# Patient Record
Sex: Male | Born: 1960
Health system: Southern US, Community
[De-identification: ages and names within clinical notes are randomized; demographics above are authoritative.]

## PROBLEM LIST (undated history)

## (undated) DIAGNOSIS — E785 Hyperlipidemia, unspecified: Secondary | ICD-10-CM

## (undated) DIAGNOSIS — K5900 Constipation, unspecified: Secondary | ICD-10-CM

## (undated) DIAGNOSIS — Z8639 Personal history of other endocrine, nutritional and metabolic disease: Secondary | ICD-10-CM

## (undated) DIAGNOSIS — R413 Other amnesia: Secondary | ICD-10-CM

## (undated) DIAGNOSIS — R9089 Other abnormal findings on diagnostic imaging of central nervous system: Secondary | ICD-10-CM

## (undated) DIAGNOSIS — M25559 Pain in unspecified hip: Secondary | ICD-10-CM

## (undated) DIAGNOSIS — M549 Dorsalgia, unspecified: Secondary | ICD-10-CM

## (undated) DIAGNOSIS — K635 Polyp of colon: Secondary | ICD-10-CM

## (undated) HISTORY — DX: Other abnormal findings on diagnostic imaging of central nervous system: R90.89

## (undated) HISTORY — DX: Personal history of other endocrine, nutritional and metabolic disease: Z86.39

## (undated) HISTORY — DX: Constipation, unspecified: K59.00

## (undated) HISTORY — PX: VASECTOMY: SHX75

## (undated) HISTORY — DX: Hyperlipidemia, unspecified: E78.5

## (undated) HISTORY — DX: Pain in unspecified hip: M25.559

## (undated) HISTORY — DX: Polyp of colon: K63.5

## (undated) HISTORY — PX: COLONOSCOPY: SHX174

## (undated) HISTORY — PX: TONSILLECTOMY: SUR1361

## (undated) HISTORY — DX: Other amnesia: R41.3

## (undated) HISTORY — PX: ROTATOR CUFF REPAIR: SHX139

## (undated) HISTORY — DX: Dorsalgia, unspecified: M54.9

## (undated) HISTORY — PX: BICEPS TENDON REPAIR: SHX566

---

## 2000-04-26 ENCOUNTER — Encounter: Payer: Self-pay | Admitting: Orthopedic Surgery

## 2000-04-26 ENCOUNTER — Ambulatory Visit (HOSPITAL_COMMUNITY): Admission: RE | Admit: 2000-04-26 | Discharge: 2000-04-26 | Payer: Self-pay | Admitting: Orthopedic Surgery

## 2000-05-22 ENCOUNTER — Ambulatory Visit (HOSPITAL_BASED_OUTPATIENT_CLINIC_OR_DEPARTMENT_OTHER): Admission: RE | Admit: 2000-05-22 | Discharge: 2000-05-22 | Payer: Self-pay | Admitting: Orthopedic Surgery

## 2001-02-03 ENCOUNTER — Ambulatory Visit (HOSPITAL_BASED_OUTPATIENT_CLINIC_OR_DEPARTMENT_OTHER): Admission: RE | Admit: 2001-02-03 | Discharge: 2001-02-03 | Payer: Self-pay | Admitting: Orthopedic Surgery

## 2005-10-22 ENCOUNTER — Encounter: Admission: RE | Admit: 2005-10-22 | Discharge: 2005-10-22 | Payer: Self-pay | Admitting: Family Medicine

## 2012-03-12 ENCOUNTER — Other Ambulatory Visit: Payer: Self-pay | Admitting: Family Medicine

## 2012-03-12 DIAGNOSIS — H9319 Tinnitus, unspecified ear: Secondary | ICD-10-CM

## 2012-03-12 DIAGNOSIS — E785 Hyperlipidemia, unspecified: Secondary | ICD-10-CM

## 2012-03-12 DIAGNOSIS — R439 Unspecified disturbances of smell and taste: Secondary | ICD-10-CM

## 2012-03-16 ENCOUNTER — Ambulatory Visit
Admission: RE | Admit: 2012-03-16 | Discharge: 2012-03-16 | Disposition: A | Discharge status: Home / Self Care | Payer: 59 | Source: Ambulatory Visit | Attending: Family Medicine | Admitting: Family Medicine

## 2012-03-16 DIAGNOSIS — R439 Unspecified disturbances of smell and taste: Secondary | ICD-10-CM

## 2012-03-16 DIAGNOSIS — H9319 Tinnitus, unspecified ear: Secondary | ICD-10-CM

## 2012-03-16 DIAGNOSIS — E785 Hyperlipidemia, unspecified: Secondary | ICD-10-CM

## 2012-03-16 MED ORDER — GADOBENATE DIMEGLUMINE 529 MG/ML IV SOLN
18.0000 mL | Freq: Once | INTRAVENOUS | Status: AC | PRN
  Administered 2012-03-16: 18 mL via INTRAVENOUS

## 2012-03-17 ENCOUNTER — Other Ambulatory Visit: Payer: Self-pay

## 2013-01-15 ENCOUNTER — Encounter: Payer: Self-pay | Admitting: *Deleted

## 2013-02-19 ENCOUNTER — Encounter: Payer: Self-pay | Admitting: *Deleted

## 2013-02-19 DIAGNOSIS — K635 Polyp of colon: Secondary | ICD-10-CM

## 2013-02-19 HISTORY — DX: Polyp of colon: K63.5

## 2013-02-19 LAB — HM COLONOSCOPY

## 2013-02-23 ENCOUNTER — Encounter: Payer: Self-pay | Admitting: *Deleted

## 2013-04-20 ENCOUNTER — Other Ambulatory Visit: Payer: Self-pay | Admitting: *Deleted

## 2013-04-20 DIAGNOSIS — Z0000 Encounter for general adult medical examination without abnormal findings: Secondary | ICD-10-CM

## 2013-04-23 ENCOUNTER — Other Ambulatory Visit: Payer: 59

## 2013-04-23 LAB — COMPLETE METABOLIC PANEL WITH GFR
ALT: 25 U/L (ref 0–53)
AST: 20 U/L (ref 0–37)
Albumin: 5 g/dL (ref 3.5–5.2)
Alkaline Phosphatase: 57 U/L (ref 39–117)
BUN: 14 mg/dL (ref 6–23)
CO2: 28 mEq/L (ref 19–32)
Calcium: 9.6 mg/dL (ref 8.4–10.5)
Chloride: 102 mEq/L (ref 96–112)
Creat: 0.9 mg/dL (ref 0.50–1.35)
GFR, Est African American: >89 mL/min
GFR, Est Non African American: >89 mL/min
Glucose, Bld: 94 mg/dL (ref 70–99)
Potassium: 4.5 mEq/L (ref 3.5–5.3)
Sodium: 140 mEq/L (ref 135–145)
Total Bilirubin: 0.8 mg/dL (ref 0.2–1.2)
Total Protein: 7 g/dL (ref 6.0–8.3)

## 2013-04-23 LAB — CBC WITH DIFFERENTIAL/PLATELET
Basophils Absolute: 0.1 10*3/uL (ref 0.0–0.1)
Basophils Relative: 2 % — ABNORMAL HIGH (ref 0–1)
Eosinophils Absolute: 0.2 10*3/uL (ref 0.0–0.7)
Eosinophils Relative: 5 % (ref 0–5)
HCT: 41.8 % (ref 39.0–52.0)
Hemoglobin: 14.8 g/dL (ref 13.0–17.0)
Lymphocytes Relative: 21 % (ref 12–46)
Lymphs Abs: 0.9 10*3/uL (ref 0.7–4.0)
MCH: 30.4 pg (ref 26.0–34.0)
MCHC: 35.4 g/dL (ref 30.0–36.0)
MCV: 85.8 fL (ref 78.0–100.0)
Monocytes Absolute: 0.5 10*3/uL (ref 0.1–1.0)
Monocytes Relative: 11 % (ref 3–12)
Neutro Abs: 2.5 10*3/uL (ref 1.7–7.7)
Neutrophils Relative %: 61 % (ref 43–77)
Platelets: 207 10*3/uL (ref 150–400)
RBC: 4.87 MIL/uL (ref 4.22–5.81)
RDW: 14 % (ref 11.5–15.5)
WBC: 4.1 10*3/uL (ref 4.0–10.5)

## 2013-04-23 LAB — LIPID PANEL
Cholesterol: 257 mg/dL — ABNORMAL HIGH (ref 0–200)
HDL: 45 mg/dL (ref >39)
LDL Cholesterol: 189 mg/dL — ABNORMAL HIGH (ref 0–99)
Total CHOL/HDL Ratio: 5.7 Ratio
Triglycerides: 116 mg/dL (ref <150)
VLDL: 23 mg/dL (ref 0–40)

## 2013-04-23 LAB — TSH: TSH: 2.265 u[IU]/mL (ref 0.350–4.500)

## 2013-05-08 ENCOUNTER — Encounter: Payer: Self-pay | Admitting: *Deleted

## 2013-05-08 DIAGNOSIS — E785 Hyperlipidemia, unspecified: Secondary | ICD-10-CM | POA: Insufficient documentation

## 2013-05-11 ENCOUNTER — Ambulatory Visit (INDEPENDENT_AMBULATORY_CARE_PROVIDER_SITE_OTHER): Payer: 59 | Admitting: Family Medicine

## 2013-05-11 ENCOUNTER — Encounter (INDEPENDENT_AMBULATORY_CARE_PROVIDER_SITE_OTHER): Payer: Self-pay

## 2013-05-11 ENCOUNTER — Encounter: Payer: 59 | Admitting: Family Medicine

## 2013-05-11 ENCOUNTER — Encounter: Payer: Self-pay | Admitting: Family Medicine

## 2013-05-11 VITALS — BP 108/72 | HR 70 | Resp 16 | Ht 70.0 in | Wt 185.0 lb

## 2013-05-11 DIAGNOSIS — Z23 Encounter for immunization: Secondary | ICD-10-CM

## 2013-05-11 DIAGNOSIS — Z0000 Encounter for general adult medical examination without abnormal findings: Secondary | ICD-10-CM

## 2013-05-11 DIAGNOSIS — R062 Wheezing: Secondary | ICD-10-CM

## 2013-05-11 LAB — POCT URINALYSIS DIPSTICK
Bilirubin, UA: NEGATIVE
Blood, UA: NEGATIVE
Glucose, UA: NEGATIVE
Ketones, UA: NEGATIVE
Leukocytes, UA: NEGATIVE
Nitrite, UA: NEGATIVE
Protein, UA: NEGATIVE
Spec Grav, UA: 1.01
Urobilinogen, UA: NEGATIVE
pH, UA: 5

## 2013-05-11 MED ORDER — ALBUTEROL SULFATE HFA 108 (90 BASE) MCG/ACT IN AERS: 2.0000 | INHALATION_SPRAY | Freq: Four times a day (QID) | RESPIRATORY_TRACT | Status: DC

## 2013-05-11 NOTE — Progress Notes (Signed)
Subjective:    Patient ID: Sean Cochran, male    DOB: [birth year], 53 y.o.   MRN: [id number]  HPI  Sean Cochran is here today for his annual CPE.  He has done well since his last office visit.  He brought his biometric screening form to be completed for his employer.  We are also going over his most recent lab results.    Review of Systems  Constitutional: Negative for appetite change, fatigue and unexpected weight change.  HENT: Negative.   Eyes: Negative for visual disturbance.  Respiratory: Negative for chest tightness and shortness of breath.   Cardiovascular: Negative for chest pain, palpitations and leg swelling.  Gastrointestinal: Negative for abdominal pain, diarrhea, constipation and blood in stool.  Endocrine: Negative.   Genitourinary: Negative for discharge, difficulty urinating and genital sores.  Allergic/Immunologic: Negative.   Neurological: Negative for dizziness and headaches.  Hematological: Negative.   Psychiatric/Behavioral: Negative for sleep disturbance and decreased concentration. The patient is not nervous/anxious.     Past Medical History  Diagnosis Date  . Hyperlipidemia   . History of pineal cyst   . Constipation   . Colon polyp 02/19/2013    Dr. Collene Mares - Repeat in 5 years     No past surgical history on file.   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     Drug Use:  None   Diet:  Regular   Exercise:  Cardio/Weights/Cutting Grass   Hobbies:  Golf, Music, Motorcycles     Family History  Problem Relation Age of Onset  . Cancer Father     Lymphoma  . Cancer Maternal Grandfather     Paternal Grandfather     Current Outpatient Prescriptions on File  Prior to Visit  Medication Sig Dispense Refill  . triamcinolone cream (KENALOG) 0.1 % Apply 1 application topically 2 (two) times daily.       No current facility-administered medications on file prior to visit.     No Known Allergies   Immunization History  Administered Date(s) Administered  . Influenza-Unspecified 01/13/2013  . Pneumococcal Conjugate-13 05/11/2013  . Td 08/09/2003  . Tdap 09/17/2008       Objective:   Physical Exam  Constitutional: He is oriented to person, place, and time. He appears well-developed and well-nourished. No distress.  HENT:  Nose: Nose normal.  Mouth/Throat: No oropharyngeal exudate.  Eyes: Conjunctivae are normal. No scleral icterus.  Neck: Normal range of motion. Neck supple. No thyromegaly present.  Cardiovascular: Normal rate, regular rhythm and normal heart sounds.   No murmur heard. Pulmonary/Chest: Effort normal and breath sounds normal.  Abdominal: Soft. Bowel sounds are normal. He exhibits no mass. There is no tenderness.  Musculoskeletal: Normal range of motion. He exhibits no edema and no tenderness.  Lymphadenopathy:    He has no cervical adenopathy.  Neurological: He is alert and oriented to person, place, and time.  Skin: No rash noted.  Psychiatric: He has a normal mood and affect. His behavior is normal. Judgment and thought content normal.      Assessment & Plan:  Sean Cochran was seen today for annual exam.  Diagnoses and associated orders for  this visit:  Routine general medical examination at a health care facility The patient had a normal CPE.  We addressed preventative issues appropriate for her  age.  Her U/A and EKG were WNL.    - POCT urinalysis dipstick - EKG 12-Lead  Wheezing - albuterol (PROAIR HFA) 108 (90 BASE) MCG/ACT inhaler; Inhale 2 puffs into the lungs 4 (four) times daily.  Need for prophylactic vaccination against Streptococcus pneumoniae (pneumococcus) - Pneumococcal conjugate vaccine  13-valent

## 2013-05-12 ENCOUNTER — Encounter: Payer: Self-pay | Admitting: *Deleted

## 2013-05-15 ENCOUNTER — Encounter: Payer: 59 | Admitting: Family Medicine

## 2014-10-04 ENCOUNTER — Encounter: Payer: Self-pay | Admitting: Neurology

## 2014-10-04 ENCOUNTER — Ambulatory Visit (INDEPENDENT_AMBULATORY_CARE_PROVIDER_SITE_OTHER): Payer: 59 | Admitting: Neurology

## 2014-10-04 VITALS — BP 98/62 | HR 76 | Ht 71.0 in | Wt 189.0 lb

## 2014-10-04 DIAGNOSIS — R269 Unspecified abnormalities of gait and mobility: Secondary | ICD-10-CM | POA: Diagnosis not present

## 2014-10-04 DIAGNOSIS — R9089 Other abnormal findings on diagnostic imaging of central nervous system: Secondary | ICD-10-CM | POA: Insufficient documentation

## 2014-10-04 DIAGNOSIS — R930 Abnormal findings on diagnostic imaging of skull and head, not elsewhere classified: Secondary | ICD-10-CM

## 2014-10-04 HISTORY — DX: Other abnormal findings on diagnostic imaging of central nervous system: R90.89

## 2014-10-04 NOTE — Patient Instructions (Addendum)
We will check blood work today, and set you up for MRI evaluation of the brain to compare to the prior study. We will check a study called a visual evoked response test, I will contact you about the results of the above.  Fall Prevention and Home Safety Falls cause injuries and can affect all age groups. It is possible to use preventive measures to significantly decrease the likelihood of falls. There are many simple measures which can make your home safer and prevent falls. OUTDOORS  Repair cracks and edges of walkways and driveways.  Remove high doorway thresholds.  Trim shrubbery on the main path into your home.  Have good outside lighting.  Clear walkways of tools, rocks, debris, and clutter.  Check that handrails are not broken and are securely fastened. Both sides of steps should have handrails.  Have leaves, snow, and ice cleared regularly.  Use sand or salt on walkways during winter months.  In the garage, clean up grease or oil spills. BATHROOM  Install night lights.  Install grab bars by the toilet and in the tub and shower.  Use non-skid mats or decals in the tub or shower.  Place a plastic non-slip stool in the shower to sit on, if needed.  Keep floors dry and clean up all water on the floor immediately.  Remove soap buildup in the tub or shower on a regular basis.  Secure bath mats with non-slip, double-sided rug tape.  Remove throw rugs and tripping hazards from the floors. BEDROOMS  Install night lights.  Make sure a bedside light is easy to reach.  Do not use oversized bedding.  Keep a telephone by your bedside.  Have a firm chair with side arms to use for getting dressed.  Remove throw rugs and tripping hazards from the floor. KITCHEN  Keep handles on pots and pans turned toward the center of the stove. Use back burners when possible.  Clean up spills quickly and allow time for drying.  Avoid walking on wet floors.  Avoid hot utensils  and knives.  Position shelves so they are not too high or low.  Place commonly used objects within easy reach.  If necessary, use a sturdy step stool with a grab bar when reaching.  Keep electrical cables out of the way.  Do not use floor polish or wax that makes floors slippery. If you must use wax, use non-skid floor wax.  Remove throw rugs and tripping hazards from the floor. STAIRWAYS  Never leave objects on stairs.  Place handrails on both sides of stairways and use them. Fix any loose handrails. Make sure handrails on both sides of the stairways are as long as the stairs.  Check carpeting to make sure it is firmly attached along stairs. Make repairs to worn or loose carpet promptly.  Avoid placing throw rugs at the top or bottom of stairways, or properly secure the rug with carpet tape to prevent slippage. Get rid of throw rugs, if possible.  Have an electrician put in a light switch at the top and bottom of the stairs. OTHER FALL PREVENTION TIPS  Wear low-heel or rubber-soled shoes that are supportive and fit well. Wear closed toe shoes.  When using a stepladder, make sure it is fully opened and both spreaders are firmly locked. Do not climb a closed stepladder.  Add color or contrast paint or tape to grab bars and handrails in your home. Place contrasting color strips on first and last steps.  Learn  and use mobility aids as needed. Install an electrical emergency response system.  Turn on lights to avoid dark areas. Replace light bulbs that burn out immediately. Get light switches that glow.  Arrange furniture to create clear pathways. Keep furniture in the same place.  Firmly attach carpet with non-skid or double-sided tape.  Eliminate uneven floor surfaces.  Select a carpet pattern that does not visually hide the edge of steps.  Be aware of all pets. OTHER HOME SAFETY TIPS  Set the water temperature for 120 F (48.8 C).  Keep emergency numbers on or near  the telephone.  Keep smoke detectors on every level of the home and near sleeping areas. Document Released: 02/09/2002 Document Revised: 08/21/2011 Document Reviewed: 05/11/2011 [facility] Patient Information 2015 [place], [place]. This information is not intended to replace advice given to you by your health care provider. Make sure you discuss any questions you have with your health care provider.

## 2014-10-04 NOTE — Progress Notes (Signed)
Reason for visit: Abnormal MRI brain  Referring physician: Dr. August Luz Sean Cochran is a 54 y.o. male  History of present illness:  Sean Cochran is a 54 year old left-handed white male with a history of tinnitus in both ears dating back 3-1/2 years, with onset of some issues with sensing unusual odors that began about 2 and half years ago. The patient underwent MRI evaluation in January 2014 for this purpose. This study showed evidence of diffuse nonspecific white matter changes in both hemispheres. The patient denies any prior history of migraine headache, no significant head trauma. The patient recently has noted some difficulty with balance, tending to veer to one side or the other with walking. He feels unsteady when he closes his eyes to wash his hair during a shower. The patient denies any falls. He denies any numbness or weakness of the extremities, or difficulty controlling the bowels or the bladder. The patient is unclear whether there have been any significant cognitive changes. He went off of his cholesterol medications in January 2015 as he was concerned about this, but he has not noted any changes in his clinical condition off of the statin drug. The patient is sent to this office for an evaluation.  Past Medical History  Diagnosis Date  . Hyperlipidemia   . History of pineal cyst   . Constipation   . Colon polyp 02/19/2013    Dr. Collene Mares - Repeat in 5 years  . Memory loss   . Back pain   . Hip pain   . Abnormal finding on MRI of brain 10/04/2014    Past Surgical History  Procedure Laterality Date  . Tonsillectomy    . Rotator cuff repair Right   . Biceps tendon repair Left   . Vasectomy      Family History  Problem Relation Age of Onset  . Cancer Father     Lymphoma  . Cancer Maternal Grandfather     Paternal Grandfather  . Prostate cancer Maternal Grandfather   . Depression Sister   . Prostate cancer Paternal Grandfather   . Heart attack Brother   .  Healthy Brother   . Healthy Brother     Social history:  reports that he has never smoked. He has never used smokeless tobacco. He reports that he drinks about 3.0 - 3.6 oz of alcohol per week. He reports that he does not use illicit drugs.  Medications:  Prior to Admission medications   Medication Sig Start Date End Date Taking? Authorizing Provider  triamcinolone cream (KENALOG) 0.1 % Apply 1 application topically 2 (two) times daily.   Yes Historical Provider, MD  albuterol (PROAIR HFA) 108 (90 BASE) MCG/ACT inhaler Inhale 2 puffs into the lungs 4 (four) times daily. 05/11/13 05/12/14  Sean Resides, MD     No Known Allergies  ROS:  Out of a complete 14 system review of symptoms, the patient complains only of the following symptoms, and all other reviewed systems are negative.  Ringing in the ears Gait instability  Blood pressure 98/62, pulse 76, height 5\' 11"  (1.803 m), weight 189 lb (85.73 kg).  Physical Exam  General: The patient is alert and cooperative at the time of the examination.  Eyes: Pupils are equal, round, and reactive to light. Discs are flat bilaterally.  Neck: The neck is supple, no carotid bruits are noted.  Respiratory: The respiratory examination is clear.  Cardiovascular: The cardiovascular examination reveals a regular rate and rhythm, no obvious  murmurs or rubs are noted.  Skin: Extremities are without significant edema.  Neurologic Exam  Mental status: The patient is alert and oriented x 3 at the time of the examination. The patient has apparent normal recent and remote memory, with an apparently normal attention span and concentration ability. Mini-Mental Status Examination done today shows a total score of 30/30. The patient is able to name 17 animals in 30 seconds.  Cranial nerves: Facial symmetry is present. There is good sensation of the face to pinprick and soft touch bilaterally. The strength of the facial muscles and the muscles to head  turning and shoulder shrug are normal bilaterally. Speech is well enunciated, no aphasia or dysarthria is noted. Extraocular movements are full. Visual fields are full. The tongue is midline, and the patient has symmetric elevation of the soft palate. No obvious hearing deficits are noted.  Motor: The motor testing reveals 5 over 5 strength of all 4 extremities. Good symmetric motor tone is noted throughout.  Sensory: Sensory testing is intact to pinprick, soft touch, vibration sensation, and position sense on all 4 extremities. No evidence of extinction is noted.  Coordination: Cerebellar testing reveals good finger-nose-finger and heel-to-shin bilaterally.  Gait and station: Gait is normal. Tandem gait is normal. Romberg is negative. No drift is seen.  Reflexes: Deep tendon reflexes are symmetric and normal bilaterally. Toes are downgoing bilaterally.   MRI brain 03/16/12:  IMPRESSION: 1. No acute intracranial abnormality. 2. Negative olfactory and internal auditory canal imaging. 3. Age advanced nonspecific subcortical white matter signal changes. Differential considerations include accelerated/hereditary small vessel ischemia, sequelae of trauma, hypercoagulable state, vasculitis, migraines, prior infection or Demyelination.  * MRI scan images were reviewed online. I agree with the written report.    Assessment/Plan:  1. Abnormal MRI brain  2. Mild gait instability  The patient has been on a statin medication for a number of years. This can result in an encephalopathy, but the patient has not noted any change in his condition off of the medication. It is likely that the statin medication has not resulted in any significant side effects for this patient. If needed, he may go back on the drug at this time. The MRI of the brain shows very nonspecific white matter changes, the patient will be set up for MRI evaluation of the brain to compare to the prior study, he will undergo  blood work today. It is possible that the balance problems may correlate with changes in the brain associated with the white matter abnormalities. The patient will undergo a visual response test, if abnormalities are noted, demyelinating disease is suggested. We may consider a lumbar puncture some point in the future.  Jill Alexanders MD 10/04/2014 7:55 PM  [facility] [address] [place], [place] [address]  Phone [phone] Fax [phone]

## 2014-10-05 ENCOUNTER — Telehealth: Payer: Self-pay

## 2014-10-05 NOTE — Telephone Encounter (Signed)
Office note faxed to Dr. Yvone Neu office [phone]).

## 2014-10-07 ENCOUNTER — Ambulatory Visit (INDEPENDENT_AMBULATORY_CARE_PROVIDER_SITE_OTHER): Payer: 59 | Admitting: Neurology

## 2014-10-07 ENCOUNTER — Telehealth: Payer: Self-pay | Admitting: Neurology

## 2014-10-07 DIAGNOSIS — R269 Unspecified abnormalities of gait and mobility: Secondary | ICD-10-CM

## 2014-10-07 DIAGNOSIS — R9089 Other abnormal findings on diagnostic imaging of central nervous system: Secondary | ICD-10-CM

## 2014-10-07 DIAGNOSIS — R930 Abnormal findings on diagnostic imaging of skull and head, not elsewhere classified: Secondary | ICD-10-CM

## 2014-10-07 NOTE — Procedures (Signed)
    History:   Sean Cochran is a 54 year old gentleman with a history of mild gait issues, prior MRI the brain has shown evidence of nonspecific white matter changes. The patient is being evaluated for possible demyelinating disease.  Description: The visual evoked response test was performed today using 32 x 32 check sizes. The absolute latencies for the N1 and the P100 wave forms were within normal limits bilaterally. The amplitudes for the P100 wave forms were also within normal limits bilaterally. The visual acuity was 20/20 OD and 20/30 OS corrected.  Impression:  The visual evoked response test above was within normal limits bilaterally. No evidence of conduction slowing was seen within the anterior visual pathways on either side on today's evaluation.

## 2014-10-07 NOTE — Telephone Encounter (Signed)
I called the patient. The VER is normal.

## 2014-10-08 ENCOUNTER — Other Ambulatory Visit: Payer: Self-pay

## 2014-10-08 LAB — LUPUS ANTICOAGULANT
DRVVT: 42.7 s (ref 0.0–55.1)
PTT LA: 31.6 s (ref 0.0–50.0)
THROMBIN TIME: 17 s (ref 0.0–20.0)
dPT Confirm Ratio: 1.11 Ratio (ref 0.00–1.40)
dPT: 41.7 s (ref 0.0–55.0)

## 2014-10-08 LAB — ENA+DNA/DS+SJORGEN'S
ENA RNP Ab: 1.3 AI — ABNORMAL HIGH (ref 0.0–0.9)
ENA SM Ab Ser-aCnc: <0.2 AI (ref 0.0–0.9)
ENA SSA (RO) Ab: <0.2 AI (ref 0.0–0.9)
ENA SSB (LA) Ab: <0.2 AI (ref 0.0–0.9)
dsDNA Ab: 1 IU/mL (ref 0–9)

## 2014-10-08 LAB — HIV ANTIBODY (ROUTINE TESTING W REFLEX): HIV Screen 4th Generation wRfx: NONREACTIVE

## 2014-10-08 LAB — VITAMIN B12: VITAMIN B 12: 394 pg/mL (ref 211–946)

## 2014-10-08 LAB — B. BURGDORFI ANTIBODIES: Lyme IgG/IgM Ab: <0.91 {ISR} (ref 0.00–0.90)

## 2014-10-08 LAB — ANA W/REFLEX: Anti Nuclear Antibody(ANA): POSITIVE — AB

## 2014-10-08 LAB — COPPER, SERUM: Copper: 88 ug/dL (ref 72–166)

## 2014-10-08 LAB — SEDIMENTATION RATE: Sed Rate: 2 mm/hr (ref 0–30)

## 2014-10-08 LAB — FACTOR 5 LEIDEN

## 2014-10-08 LAB — ANGIOTENSIN CONVERTING ENZYME: Angio Convert Enzyme: 32 U/L (ref 14–82)

## 2014-10-08 LAB — RPR: RPR Ser Ql: NONREACTIVE

## 2014-10-08 LAB — RHEUMATOID FACTOR: RHEUMATOID FACTOR: 13.8 [IU]/mL (ref 0.0–13.9)

## 2014-10-14 ENCOUNTER — Telehealth: Payer: Self-pay | Admitting: Neurology

## 2014-10-14 NOTE — Telephone Encounter (Signed)
Patient would like order for MRI transferred to [facility]

## 2014-10-25 ENCOUNTER — Ambulatory Visit (HOSPITAL_COMMUNITY): Payer: Self-pay

## 2014-11-01 ENCOUNTER — Ambulatory Visit (HOSPITAL_COMMUNITY)
Admission: RE | Admit: 2014-11-01 | Discharge: 2014-11-01 | Disposition: A | Discharge status: Home / Self Care | Payer: 59 | Source: Ambulatory Visit | Attending: Neurology | Admitting: Neurology

## 2014-11-01 DIAGNOSIS — R269 Unspecified abnormalities of gait and mobility: Secondary | ICD-10-CM | POA: Insufficient documentation

## 2014-11-01 DIAGNOSIS — R930 Abnormal findings on diagnostic imaging of skull and head, not elsewhere classified: Secondary | ICD-10-CM | POA: Diagnosis present

## 2014-11-01 DIAGNOSIS — G319 Degenerative disease of nervous system, unspecified: Secondary | ICD-10-CM | POA: Insufficient documentation

## 2014-11-01 DIAGNOSIS — R9089 Other abnormal findings on diagnostic imaging of central nervous system: Secondary | ICD-10-CM

## 2014-11-02 ENCOUNTER — Telehealth: Payer: Self-pay | Admitting: Neurology

## 2014-11-02 NOTE — Telephone Encounter (Signed)
I called the patient. MRI of the brain shows very nonspecific white matter changes, I see no significant change from 2012. I discussed the possibility of performing lumbar puncture with the patient. He will contact me. The sizes wishes to undergo the procedure.  MRI brain 11/02/14:  IMPRESSION: Mild progression of scattered nonspecific punctate and patchy white matter type changes. Considerations include changes secondary to demyelinating process, inflammatory process, vasculitis, small vessel disease, result of prior infection/trauma and less likely migraine headaches.  Global atrophy without hydrocephalus.  Remainder of findings without significant change as noted above.

## 2015-09-29 DIAGNOSIS — D225 Melanocytic nevi of trunk: Secondary | ICD-10-CM | POA: Diagnosis not present

## 2015-09-29 DIAGNOSIS — L821 Other seborrheic keratosis: Secondary | ICD-10-CM | POA: Diagnosis not present

## 2015-09-29 DIAGNOSIS — D1800 Hemangioma unspecified site: Secondary | ICD-10-CM | POA: Diagnosis not present

## 2015-09-29 DIAGNOSIS — D485 Neoplasm of uncertain behavior of skin: Secondary | ICD-10-CM | POA: Diagnosis not present

## 2015-11-14 DIAGNOSIS — N401 Enlarged prostate with lower urinary tract symptoms: Secondary | ICD-10-CM | POA: Diagnosis not present

## 2015-11-21 DIAGNOSIS — R351 Nocturia: Secondary | ICD-10-CM | POA: Diagnosis not present

## 2015-11-21 DIAGNOSIS — N401 Enlarged prostate with lower urinary tract symptoms: Secondary | ICD-10-CM | POA: Diagnosis not present

## 2016-01-24 IMAGING — MR MR HEAD W/O CM
9 of 12 series · 29 of 48 positions shown · non-contrast
Comparison: 03/16/2012 brain MR. 10/22/2005 head CT.

CLINICAL DATA: 54-year-old male with mild gait issues. Nonspecific
white matter type changes on MR. Evaluate for possible demyelinating
process. History of pineal cyst and hyperlipidemia. Subsequent
encounter.

EXAM:
MRI HEAD WITHOUT CONTRAST
TECHNIQUE: Multiplanar, multiecho pulse sequences of the brain and surrounding
structures were obtained without intravenous contrast.

[Series 2: FLAIR · sagittal · 5.0mm · 0.47mm/px · 1 of 26 slices shown (1 of 3)]
[im 1/26]
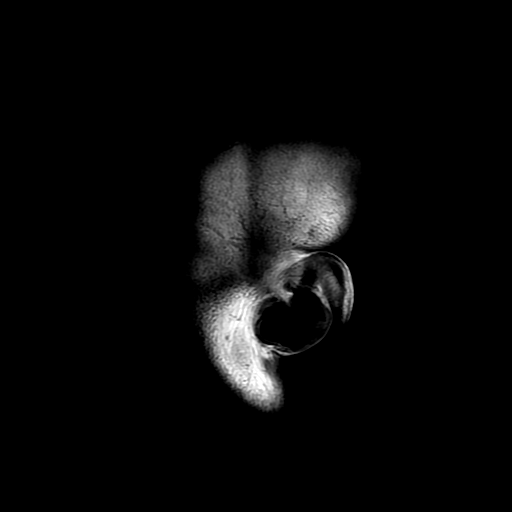

[Series 4: DWI · axial · 3.0mm · 0.94mm/px · z∈[-36,+129]mm · 6 of 111 slices shown (1 of 4)]
[im 1/111]
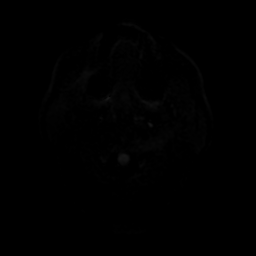
[im 23/111]
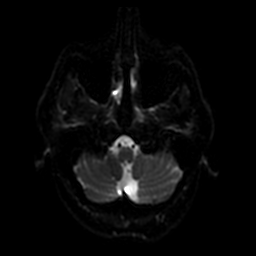
[im 45/111]
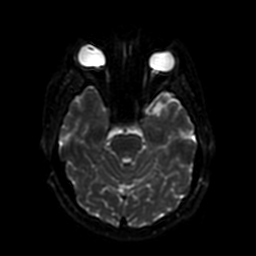
[im 67/111]
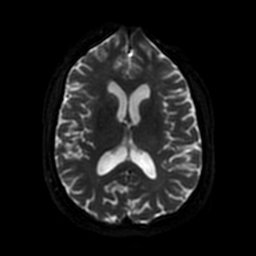
[im 89/111]
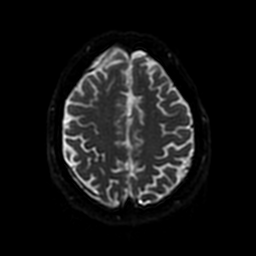
[im 111/111]
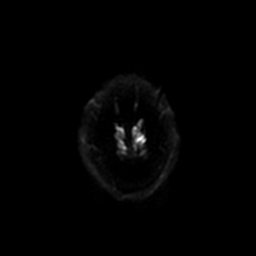

[Series 5: T2 · axial · 5.0mm · 0.47mm/px · 1 of 28 slices shown (1 of 2)]
[im 1/28]
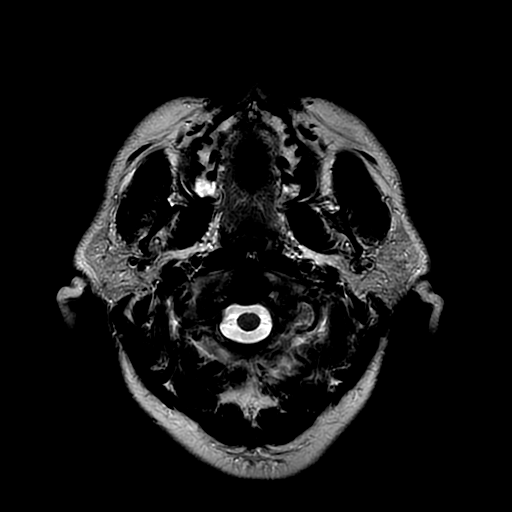

[Series 6: FLAIR · axial · 5.0mm · 0.47mm/px · 1 of 28 slices shown (2 of 3)]
[im 1/28]
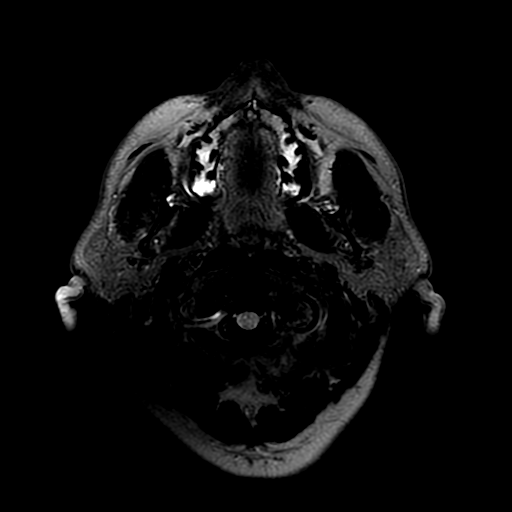

[Series 7: FLAIR · sagittal · 1.6mm · 0.49mm/px · 9 of 232 slices shown (3 of 3)]
[im 1/232]
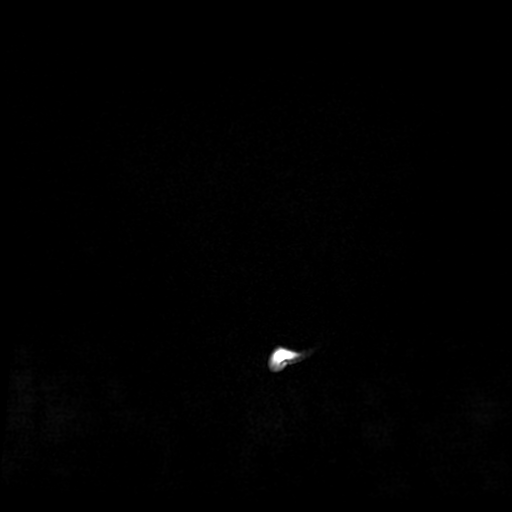
[im 43/232]
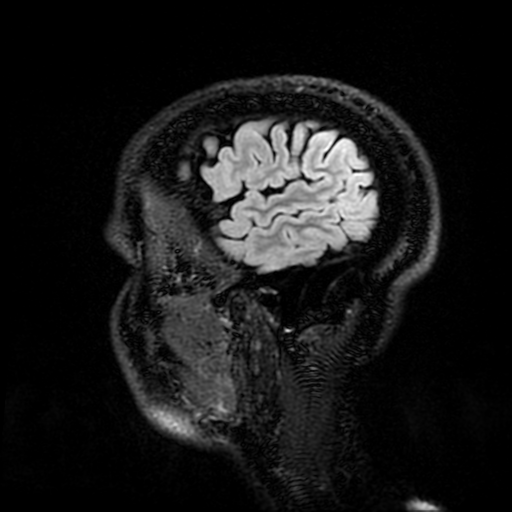
[im 64/232]
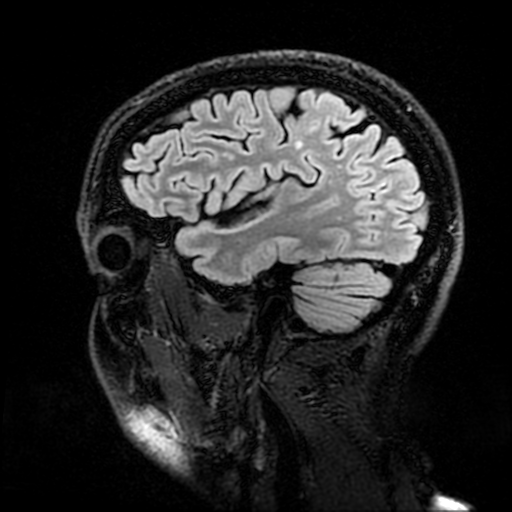
[im 106/232]
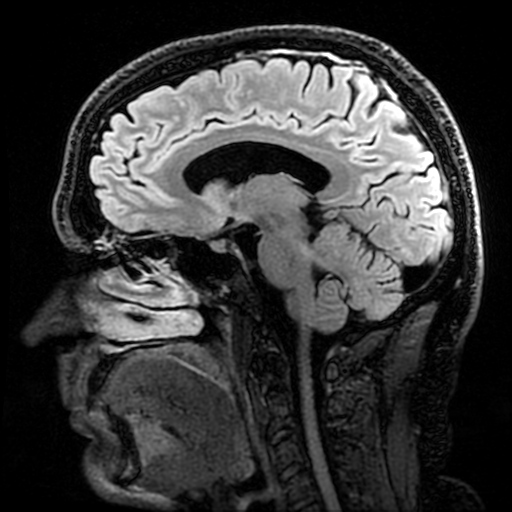
[im 127/232]
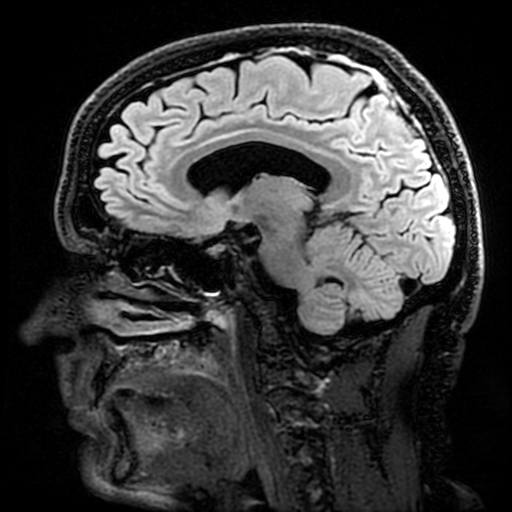
[im 169/232]
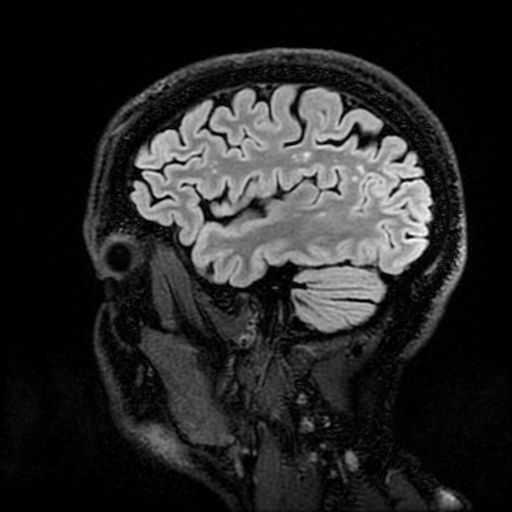
[im 190/232]
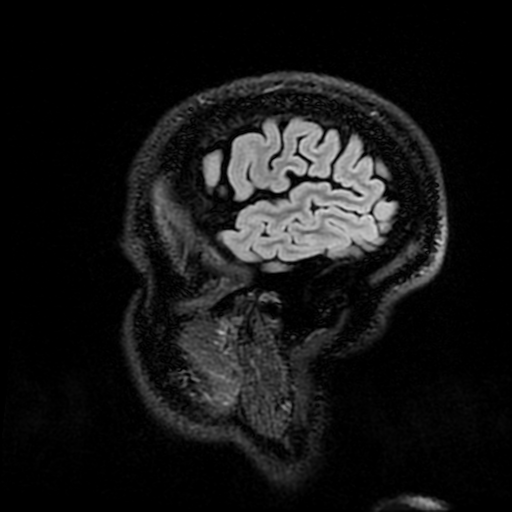
[im 211/232]
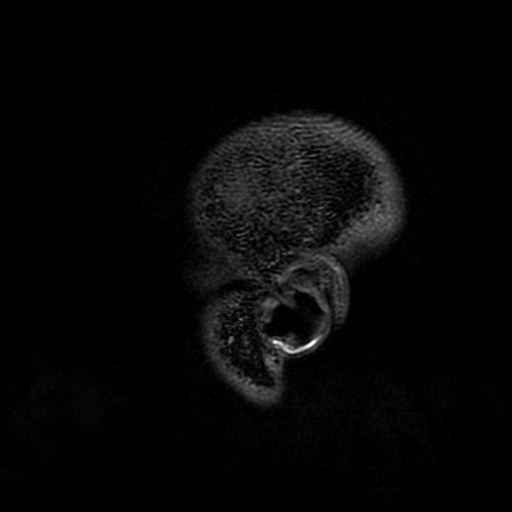
[im 232/232]
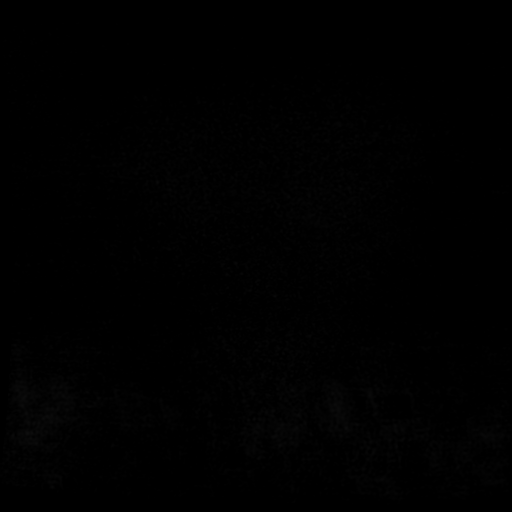

[Series 8: DWI · coronal · 5.0mm · 0.94mm/px · 4 of 76 slices shown (2 of 4)]
[im 1/76]
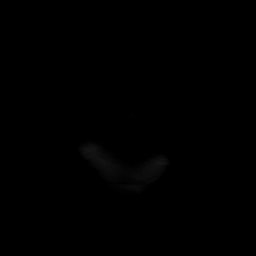
[im 26/76]
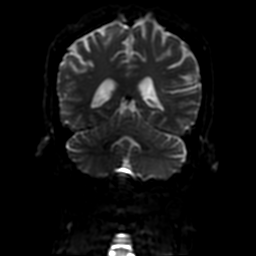
[im 51/76]
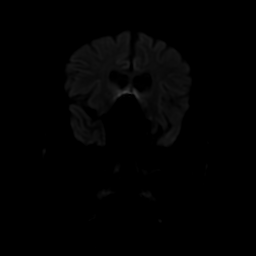
[im 76/76]
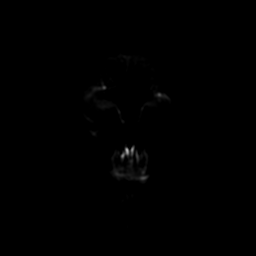

[Series 11: T2 · coronal · 5.0mm · 0.47mm/px · 2 of 32 slices shown (2 of 2)]
[im 1/32]
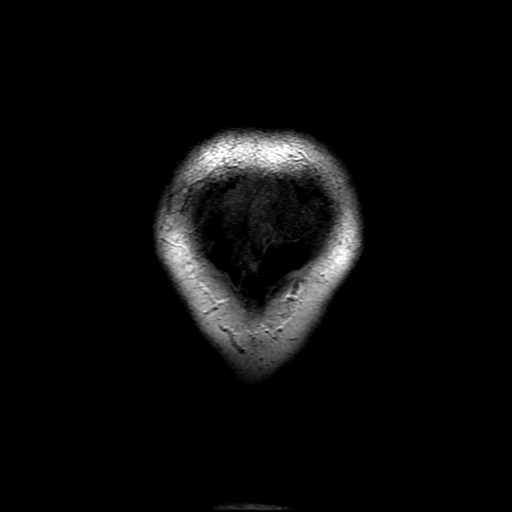
[im 32/32]
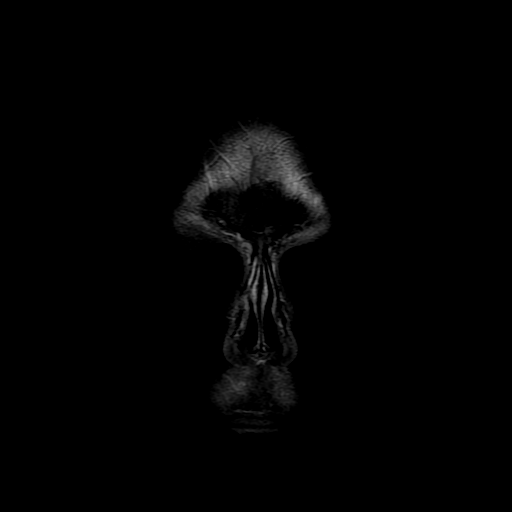

[Series 400: DWI · axial · 3.0mm · 0.94mm/px · z∈[-36,+129]mm · 3 of 56 slices shown (3 of 4)]
[im 1/56]
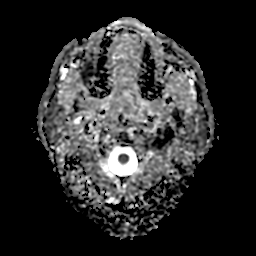
[im 28/56]
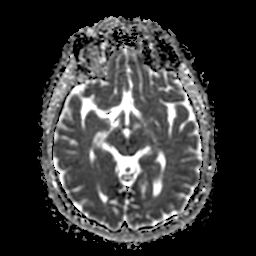
[im 56/56]
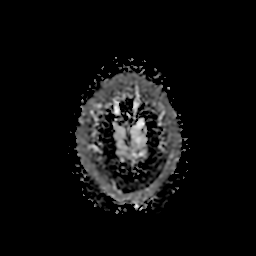

[Series 800: DWI · coronal · 5.0mm · 0.94mm/px · 2 of 38 slices shown (4 of 4)]
[im 1/38]
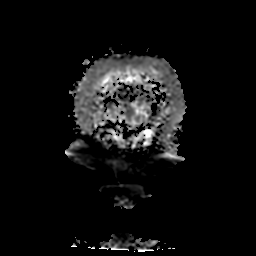
[im 38/38]
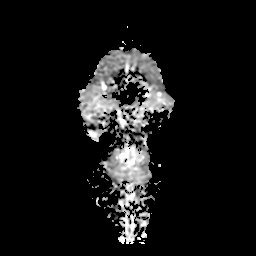

[29 of 48 positions shown; findings below may reference images not displayed]

FINDINGS: No acute infarct.

No intracranial hemorrhage.

Mild progression of scattered nonspecific punctate and patchy white
matter type changes predominantly supratentorial region and greater
on the left. Considerations include changes secondary to
demyelinating process, inflammatory process, vasculitis, small
vessel disease, result of prior infection/trauma and less likely
migraine headaches. No specific findings such as temporal lobe
involvement to suggest underlying CADASIL (cerebral autosomal
dominant arteriopathy with subcortical infarcts and
leukoencephalopathy).

Global atrophy without hydrocephalus.

Calcified pineal gland with small cysts without associated mass
effect appears stable.

Ossification anterior falx incidentally noted and unchanged.

Major intracranial vascular structures are patent. Mild prominence
basilar tip appears stable.

Cervical medullary junction, pituitary region and orbital structures
unremarkable.
IMPRESSION: Mild progression of scattered nonspecific punctate and patchy white
matter type changes. Considerations include changes secondary to
demyelinating process, inflammatory process, vasculitis, small
vessel disease, result of prior infection/trauma and less likely
migraine headaches.

Global atrophy without hydrocephalus.

Remainder of findings without significant change as noted above.

## 2016-04-26 ENCOUNTER — Encounter: Payer: Self-pay | Admitting: Sports Medicine

## 2016-04-26 ENCOUNTER — Ambulatory Visit (INDEPENDENT_AMBULATORY_CARE_PROVIDER_SITE_OTHER): Payer: 59 | Admitting: Sports Medicine

## 2016-04-26 VITALS — BP 108/90 | Ht 71.0 in | Wt 185.0 lb

## 2016-04-26 DIAGNOSIS — M79661 Pain in right lower leg: Secondary | ICD-10-CM

## 2016-04-26 NOTE — Progress Notes (Signed)
  Sean Cochran - 56 y.o. male MRN [id number]  Date of birth: [birth year]  SUBJECTIVE:   CC: R. Calf pain   HPI: Patient presents today with complaints of right calf pain. Patient states that 2 days ago he was at tennis practice and he landed on the heel of his foot. He immediately felt a sharp pain. The pain was localized to his right calf. Endorses having a lemon size knot in the calf. He was able to talk on it after words but was limping. He denies any swelling, numbness tingling, or bruising to the area after. Today pain is better and know is resolved. However he still has some tenderness to the medical aspect of right calf.   Of note, patient states he has cramps that occur mainly at night often.   ROS per HPI.    HISTORY: Past Medical, Surgical, Social, and Family History Reviewed & Updated per EMR.   Pertinent Historical Findings include: None  PHYSICAL EXAM:  BP 108/90   Ht 5' 11"  (1.803 m)   Wt 185 lb (83.9 kg)   BMI 25.80 kg/m   General: alert, well-developed, NAD, cooperative Msk: no joint swelling, no joint warmth, and no redness over joints.  Neurologic: No focal deficits, A&Ox3. Deep tendon reflexes symmetrical and normal. Skin: Intact without suspicious lesions or rashes. Warm and dry. Psych: Mood and affect are normal  R Leg: No visible erythema or swelling. Range of motion is full in all directions. Strength is 5/5 and tone is normal Tenderness to palpation of the medial head of the gastrocnemius muscle DP/PT are full and equal bilaterally. Able to walk 4 steps.  ASSESSMENT & PLAN:   1. Right calf pain Largely unknown etiology. Most likely patient had severe cramp of his gastrocnemius muscle. [place] without any signs of a tear or other pathology. Will treat conservatively. Patient given calf compression sleeve. Placed heel lifts. Encouraged stretching exercises and continued hydration for patient. Can ues OTC NSAID for discomfort. Continue ice prn. Follow-up prn.      Luiz Blare, DO 04/26/2016, 11:35 AM PGY-3, [place]  Patient seen and evaluated with the resident. I agree with the above plan of care. It's unsure whether or not this patient's calf pain is from a calf strain or from calf cramping. His ultrasound today is normal. We will treat him with some heel lifts and a compression sleeve. I've recommended that he avoid tennis for the next 7-10 days. If he continues to have reoccurring episodes with resumption of tennis then we may need to look for neurological causes of calf cramping such is possible spinal stenosis. Follow-up for ongoing or recalcitrant issues.

## 2016-05-14 ENCOUNTER — Telehealth: Payer: Self-pay | Admitting: *Deleted

## 2016-05-14 NOTE — Telephone Encounter (Signed)
Pt feels he may have re-injured his calf playing yesterday and he has not been wearing the sleeve all the time. Told him to wear the sleeve all the time except for sleeping, icing, and showering; Reduce the amount of activity he is doing for the next 2 weeks to give this injury time to heal. He will do as instructed and call us back if things do not get better

## 2016-06-11 ENCOUNTER — Telehealth: Payer: Self-pay | Admitting: Family Medicine

## 2016-06-11 NOTE — Telephone Encounter (Signed)
Dr. Ronnald Ramp, Patient is wanting to become a patient of yours. Is that ok with you?

## 2016-06-11 NOTE — Telephone Encounter (Signed)
yes

## 2016-06-12 NOTE — Telephone Encounter (Signed)
Thank you. Patient has an appointment April 24 at 930.

## 2016-06-26 ENCOUNTER — Ambulatory Visit (INDEPENDENT_AMBULATORY_CARE_PROVIDER_SITE_OTHER): Payer: 59 | Admitting: Internal Medicine

## 2016-06-26 ENCOUNTER — Encounter: Payer: Self-pay | Admitting: Internal Medicine

## 2016-06-26 ENCOUNTER — Other Ambulatory Visit (INDEPENDENT_AMBULATORY_CARE_PROVIDER_SITE_OTHER): Payer: 59

## 2016-06-26 VITALS — BP 120/80 | HR 70 | Temp 98.6°F | Resp 16 | Ht 71.0 in | Wt 188.0 lb

## 2016-06-26 DIAGNOSIS — M791 Myalgia, unspecified site: Secondary | ICD-10-CM

## 2016-06-26 DIAGNOSIS — Z0000 Encounter for general adult medical examination without abnormal findings: Secondary | ICD-10-CM

## 2016-06-26 DIAGNOSIS — R5383 Other fatigue: Secondary | ICD-10-CM | POA: Diagnosis not present

## 2016-06-26 DIAGNOSIS — E785 Hyperlipidemia, unspecified: Secondary | ICD-10-CM | POA: Diagnosis not present

## 2016-06-26 LAB — C-REACTIVE PROTEIN: CRP: <0.1 mg/dL — ABNORMAL LOW (ref 0.5–20.0)

## 2016-06-26 LAB — URINALYSIS, ROUTINE W REFLEX MICROSCOPIC
Bilirubin Urine: NEGATIVE
HGB URINE DIPSTICK: NEGATIVE
KETONES UR: NEGATIVE
Leukocytes, UA: NEGATIVE
Nitrite: NEGATIVE
SPECIFIC GRAVITY, URINE: 1.01 (ref 1.000–1.030)
TOTAL PROTEIN, URINE-UPE24: NEGATIVE
URINE GLUCOSE: NEGATIVE
UROBILINOGEN UA: 0.2 (ref 0.0–1.0)
WBC, UA: NONE SEEN — AB (ref 0–?)
pH: 6 (ref 5.0–8.0)

## 2016-06-26 LAB — CBC WITH DIFFERENTIAL/PLATELET
BASOS ABS: 0.1 10*3/uL (ref 0.0–0.1)
BASOS PCT: 2.2 % (ref 0.0–3.0)
EOS PCT: 11 % — AB (ref 0.0–5.0)
Eosinophils Absolute: 0.5 10*3/uL (ref 0.0–0.7)
HCT: 44.6 % (ref 39.0–52.0)
Hemoglobin: 15.4 g/dL (ref 13.0–17.0)
LYMPHS ABS: 0.9 10*3/uL (ref 0.7–4.0)
Lymphocytes Relative: 19 % (ref 12.0–46.0)
MCHC: 34.5 g/dL (ref 30.0–36.0)
MCV: 90.1 fl (ref 78.0–100.0)
MONO ABS: 0.5 10*3/uL (ref 0.1–1.0)
Monocytes Relative: 10 % (ref 3.0–12.0)
NEUTROS PCT: 57.8 % (ref 43.0–77.0)
Neutro Abs: 2.7 10*3/uL (ref 1.4–7.7)
PLATELETS: 206 10*3/uL (ref 150.0–400.0)
RBC: 4.95 Mil/uL (ref 4.22–5.81)
RDW: 14.3 % (ref 11.5–15.5)
WBC: 4.7 10*3/uL (ref 4.0–10.5)

## 2016-06-26 LAB — COMPREHENSIVE METABOLIC PANEL
ALK PHOS: 70 U/L (ref 39–117)
ALT: 45 U/L (ref 0–53)
AST: 25 U/L (ref 0–37)
Albumin: 4.7 g/dL (ref 3.5–5.2)
BUN: 12 mg/dL (ref 6–23)
CHLORIDE: 103 meq/L (ref 96–112)
CO2: 32 mEq/L (ref 19–32)
Calcium: 9.8 mg/dL (ref 8.4–10.5)
Creatinine, Ser: 1 mg/dL (ref 0.40–1.50)
GFR: 82.53 mL/min (ref >60.00)
GLUCOSE: 97 mg/dL (ref 70–99)
POTASSIUM: 4.3 meq/L (ref 3.5–5.1)
SODIUM: 139 meq/L (ref 135–145)
TOTAL PROTEIN: 7 g/dL (ref 6.0–8.3)
Total Bilirubin: 1 mg/dL (ref 0.2–1.2)

## 2016-06-26 LAB — LIPID PANEL
CHOL/HDL RATIO: 6
Cholesterol: 269 mg/dL — ABNORMAL HIGH (ref 0–200)
HDL: 46.3 mg/dL (ref >39.00)
LDL Cholesterol: 188 mg/dL — ABNORMAL HIGH (ref 0–99)
NonHDL: 222.8
TRIGLYCERIDES: 173 mg/dL — AB (ref 0.0–149.0)
VLDL: 34.6 mg/dL (ref 0.0–40.0)

## 2016-06-26 LAB — THYROID PANEL WITH TSH
Free Thyroxine Index: 1.3 — ABNORMAL LOW (ref 1.4–3.8)
T3 UPTAKE: 37 % — AB (ref 22–35)
T4, Total: 3.4 ug/dL — ABNORMAL LOW (ref 4.5–12.0)
TSH: 2.89 mIU/L (ref 0.40–4.50)

## 2016-06-26 LAB — HEPATITIS C ANTIBODY: HCV Ab: NEGATIVE

## 2016-06-26 LAB — CK: CK TOTAL: 122 U/L (ref 7–232)

## 2016-06-26 NOTE — Progress Notes (Signed)
Subjective:  Patient ID: Sean Cochran, male    DOB: 1960-05-15  Age: 56 y.o. MRN: [id number]  CC: Annual Exam   HPI Sean Cochran presents for a CPX.  He has been seeing sports medicine regarding muscle cramps in his calves and right biceps and joint aches. No specific cause has been found. He does not experience claudication. His symptoms are not worsened by activity. He also complains of fatigue and wants to have his testosterone level checked.  History Sean Cochran has a past medical history of Abnormal finding on MRI of brain (10/04/2014); Back pain; Colon polyp (02/19/2013); Constipation; Hip pain; History of pineal cyst; Hyperlipidemia; and Memory loss.   He has a past surgical history that includes Tonsillectomy; Rotator cuff repair (Right); Biceps tendon repair (Left); and Vasectomy.   His family history includes Cancer in his father and maternal grandfather; Depression in his sister; Healthy in his brother and brother; Heart attack in his brother; Prostate cancer in his maternal grandfather and paternal grandfather.He reports that he has never smoked. He has never used smokeless tobacco. He reports that he drinks about 3.0 - 3.6 oz of alcohol per week . He reports that he does not use drugs.  Outpatient Medications Prior to Visit  Medication Sig Dispense Refill  . albuterol (PROVENTIL HFA;VENTOLIN HFA) 108 (90 Base) MCG/ACT inhaler Inhale into the lungs.    . triamcinolone cream (KENALOG) 0.1 % Apply topically.    Marland Kitchen albuterol (PROAIR HFA) 108 (90 BASE) MCG/ACT inhaler Inhale 2 puffs into the lungs 4 (four) times daily. 1 Inhaler 11  . triamcinolone cream (KENALOG) 0.1 % Apply 1 application topically 2 (two) times daily.     No facility-administered medications prior to visit.     ROS Review of Systems  Constitutional: Positive for fatigue. Negative for activity change, appetite change, diaphoresis, fever and unexpected weight change.  HENT: Negative.  Negative for trouble  swallowing.   Eyes: Negative for visual disturbance.  Respiratory: Negative for cough, chest tightness, shortness of breath and wheezing.   Cardiovascular: Negative for chest pain, palpitations and leg swelling.  Gastrointestinal: Negative for abdominal pain, blood in stool, constipation, diarrhea, nausea and vomiting.  Endocrine: Negative.  Negative for cold intolerance and heat intolerance.  Genitourinary: Negative.  Negative for difficulty urinating.  Musculoskeletal: Positive for arthralgias and myalgias. Negative for back pain, gait problem, joint swelling, neck pain and neck stiffness.  Skin: Negative.  Negative for color change and rash.  Allergic/Immunologic: Negative.   Neurological: Negative.  Negative for dizziness, tremors, weakness and numbness.  Hematological: Negative for adenopathy. Does not bruise/bleed easily.  Psychiatric/Behavioral: Negative.     Objective:  BP 120/80 (BP Location: Left Arm, Patient Position: Sitting, Cuff Size: Normal)   Pulse 70   Temp 98.6 F (37 C) (Oral)   Resp 16   Ht 5\' 11"  (1.803 m)   Wt 188 lb (85.3 kg)   SpO2 98%   BMI 26.22 kg/m   Physical Exam  Constitutional: He is oriented to person, place, and time. No distress.  HENT:  Mouth/Throat: Oropharynx is clear and moist. No oropharyngeal exudate.  Eyes: Conjunctivae are normal. Right eye exhibits no discharge. Left eye exhibits no discharge. No scleral icterus.  Neck: Normal range of motion. Neck supple. No JVD present. No tracheal deviation present. No thyromegaly present.  Cardiovascular: Normal rate, regular rhythm, normal heart sounds and intact distal pulses.  Exam reveals no gallop and no friction rub.   No murmur heard. EKG -  Sinus  Rhythm  WITHIN NORMAL LIMITS  No change from prior EKG No LVH  Pulmonary/Chest: Effort normal and breath sounds normal. No stridor. No respiratory distress. He has no wheezes. He has no rales. He exhibits no tenderness.  Abdominal: Soft. Bowel  sounds are normal. He exhibits no distension and no mass. There is no tenderness. There is no rebound and no guarding.  Genitourinary:  Genitourinary Comments: GU and rectal exams were deferred at this request, he sees a urologist yearly and prefers that this exam be done by his urologist  Musculoskeletal: Normal range of motion. He exhibits no edema, tenderness or deformity.  Lymphadenopathy:    He has no cervical adenopathy.  Neurological: He is oriented to person, place, and time.  Skin: Skin is warm and dry. No rash noted. He is not diaphoretic. No erythema. No pallor.  Psychiatric: He has a normal mood and affect. His behavior is normal. Judgment normal.  Vitals reviewed.   Lab Results  Component Value Date   WBC 4.7 06/26/2016   HGB 15.4 06/26/2016   HCT 44.6 06/26/2016   PLT 206.0 06/26/2016   GLUCOSE 97 06/26/2016   CHOL 269 (H) 06/26/2016   TRIG 173.0 (H) 06/26/2016   HDL 46.30 06/26/2016   LDLCALC 188 (H) 06/26/2016   ALT 45 06/26/2016   AST 25 06/26/2016   NA 139 06/26/2016   K 4.3 06/26/2016   CL 103 06/26/2016   CREATININE 1.00 06/26/2016   BUN 12 06/26/2016   CO2 32 06/26/2016   TSH 2.89 06/26/2016    Assessment & Plan:   Sean Cochran was seen today for annual exam.  Diagnoses and all orders for this visit:  Routine general medical examination at a health care facility- Exam completed, labs reviewed, vaccines reviewed and updated, colon cancer screening is up-to-date, patient had material was given. -     Comprehensive metabolic panel; Future -     CBC with Differential/Platelet; Future -     Lipid panel; Future -     Urinalysis, Routine w reflex microscopic; Future -     Hepatitis C antibody; Future -     Cancel: PSA; Future  Myalgia- examination is normal, CK and CRP are negative for any evidence of myopathy or myositis. I have encouraged him to continue follow up with sports medicine about this. -     CK; Future -     C-reactive protein;  Future  Fatigue, unspecified type- his EKG is negative for ischemia or LVH, his testosterone and thyroid levels are normal. The rest of his lab does not show any metabolic causes for fatigue. He will let me know if he develops any new or worsening symptoms. -     Thyroid Panel With TSH; Future -     Testosterone Total,Free,Bio, Males; Future -     EKG 12-Lead  Hyperlipidemia LDL goal <160- his Framingham risk is only 8% and he does not want to start taking a statin.   I am having Sean Cochran maintain his albuterol and triamcinolone cream.  No orders of the defined types were placed in this encounter.    Follow-up: Return in about 6 months (around 12/26/2016).  Scarlette Calico, MD

## 2016-06-26 NOTE — Progress Notes (Signed)
Pre visit review using our clinic review tool, if applicable. No additional management support is needed unless otherwise documented below in the visit note. 

## 2016-06-26 NOTE — Patient Instructions (Signed)
 Health Maintenance, Male A healthy lifestyle and preventive care is important for your health and wellness. Ask your health care provider about what schedule of regular examinations is right for you. What should I know about weight and diet?  Eat a Healthy Diet  Eat plenty of vegetables, fruits, whole grains, low-fat dairy products, and lean protein.  Do not eat a lot of foods high in solid fats, added sugars, or salt. Maintain a Healthy Weight  Regular exercise can help you achieve or maintain a healthy weight. You should:  Do at least 150 minutes of exercise each week. The exercise should increase your heart rate and make you sweat (moderate-intensity exercise).  Do strength-training exercises at least twice a week. Watch Your Levels of Cholesterol and Blood Lipids  Have your blood tested for lipids and cholesterol every 5 years starting at 55 years of age. If you are at high risk for heart disease, you should start having your blood tested when you are 55 years old. You may need to have your cholesterol levels checked more often if:  Your lipid or cholesterol levels are high.  You are older than 55 years of age.  You are at high risk for heart disease. What should I know about cancer screening? Many types of cancers can be detected early and may often be prevented. Lung Cancer  You should be screened every year for lung cancer if:  You are a current smoker who has smoked for at least 30 years.  You are a former smoker who has quit within the past 15 years.  Talk to your health care provider about your screening options, when you should start screening, and how often you should be screened. Colorectal Cancer  Routine colorectal cancer screening usually begins at 55 years of age and should be repeated every 5-10 years until you are 55 years old. You may need to be screened more often if early forms of precancerous polyps or small growths are found. Your health care provider  may recommend screening at an earlier age if you have risk factors for colon cancer.  Your health care provider may recommend using home test kits to check for hidden blood in the stool.  A small camera at the end of a tube can be used to examine your colon (sigmoidoscopy or colonoscopy). This checks for the earliest forms of colorectal cancer. Prostate and Testicular Cancer  Depending on your age and overall health, your health care provider may do certain tests to screen for prostate and testicular cancer.  Talk to your health care provider about any symptoms or concerns you have about testicular or prostate cancer. Skin Cancer  Check your skin from head to toe regularly.  Tell your health care provider about any new moles or changes in moles, especially if:  There is a change in a mole's size, shape, or color.  You have a mole that is larger than a pencil eraser.  Always use sunscreen. Apply sunscreen liberally and repeat throughout the day.  Protect yourself by wearing long sleeves, pants, a wide-brimmed hat, and sunglasses when outside. What should I know about heart disease, diabetes, and high blood pressure?  If you are 18-39 years of age, have your blood pressure checked every 3-5 years. If you are 40 years of age or older, have your blood pressure checked every year. You should have your blood pressure measured twice-once when you are at a hospital or clinic, and once when you are not at   a hospital or clinic. Record the average of the two measurements. To check your blood pressure when you are not at a hospital or clinic, you can use:  An automated blood pressure machine at a pharmacy.  A home blood pressure monitor.  Talk to your health care provider about your target blood pressure.  If you are between 45-79 years old, ask your health care provider if you should take aspirin to prevent heart disease.  Have regular diabetes screenings by checking your fasting blood sugar  level.  If you are at a normal weight and have a low risk for diabetes, have this test once every three years after the age of 45.  If you are overweight and have a high risk for diabetes, consider being tested at a younger age or more often.  A one-time screening for abdominal aortic aneurysm (AAA) by ultrasound is recommended for men aged 65-75 years who are current or former smokers. What should I know about preventing infection? Hepatitis B  If you have a higher risk for hepatitis B, you should be screened for this virus. Talk with your health care provider to find out if you are at risk for hepatitis B infection. Hepatitis C  Blood testing is recommended for:  Everyone born from 1945 through 1965.  Anyone with known risk factors for hepatitis C. Sexually Transmitted Diseases (STDs)  You should be screened each year for STDs including gonorrhea and chlamydia if:  You are sexually active and are younger than 55 years of age.  You are older than 55 years of age and your health care provider tells you that you are at risk for this type of infection.  Your sexual activity has changed since you were last screened and you are at an increased risk for chlamydia or gonorrhea. Ask your health care provider if you are at risk.  Talk with your health care provider about whether you are at high risk of being infected with HIV. Your health care provider may recommend a prescription medicine to help prevent HIV infection. What else can I do?  Schedule regular health, dental, and eye exams.  Stay current with your vaccines (immunizations).  Do not use any tobacco products, such as cigarettes, chewing tobacco, and e-cigarettes. If you need help quitting, ask your health care provider.  Limit alcohol intake to no more than 2 drinks per day. One drink equals 12 ounces of beer, 5 ounces of wine, or 1 ounces of hard liquor.  Do not use street drugs.  Do not share needles.  Ask your health  care provider for help if you need support or information about quitting drugs.  Tell your health care provider if you often feel depressed.  Tell your health care provider if you have ever been abused or do not feel safe at home. This information is not intended to replace advice given to you by your health care provider. Make sure you discuss any questions you have with your health care provider. Document Released: 08/18/2007 Document Revised: 10/19/2015 Document Reviewed: 11/23/2014 Elsevier Interactive Patient Education  2017 [employer]  

## 2016-06-27 ENCOUNTER — Encounter: Payer: Self-pay | Admitting: Internal Medicine

## 2016-06-27 LAB — TESTOSTERONE TOTAL,FREE,BIO, MALES
ALBUMIN: 4.8 g/dL (ref 3.6–5.1)
SEX HORMONE BINDING: 33 nmol/L (ref 10–50)
Testosterone, Bioavailable: 150.1 ng/dL (ref 110.0–575.0)
Testosterone, Free: 68.6 pg/mL (ref 46.0–224.0)
Testosterone: 516 ng/dL (ref 250–827)

## 2016-11-12 DIAGNOSIS — Z125 Encounter for screening for malignant neoplasm of prostate: Secondary | ICD-10-CM | POA: Diagnosis not present

## 2016-11-16 DIAGNOSIS — Z23 Encounter for immunization: Secondary | ICD-10-CM | POA: Diagnosis not present

## 2016-11-22 DIAGNOSIS — Z125 Encounter for screening for malignant neoplasm of prostate: Secondary | ICD-10-CM | POA: Diagnosis not present

## 2016-11-22 DIAGNOSIS — R351 Nocturia: Secondary | ICD-10-CM | POA: Diagnosis not present

## 2016-11-22 DIAGNOSIS — N401 Enlarged prostate with lower urinary tract symptoms: Secondary | ICD-10-CM | POA: Diagnosis not present

## 2016-12-31 ENCOUNTER — Other Ambulatory Visit (INDEPENDENT_AMBULATORY_CARE_PROVIDER_SITE_OTHER): Payer: 59

## 2016-12-31 ENCOUNTER — Encounter: Payer: Self-pay | Admitting: Internal Medicine

## 2016-12-31 ENCOUNTER — Ambulatory Visit (INDEPENDENT_AMBULATORY_CARE_PROVIDER_SITE_OTHER): Payer: 59 | Admitting: Internal Medicine

## 2016-12-31 VITALS — BP 118/78 | HR 70 | Temp 98.2°F | Resp 16 | Ht 71.0 in | Wt 189.2 lb

## 2016-12-31 DIAGNOSIS — R5383 Other fatigue: Secondary | ICD-10-CM

## 2016-12-31 DIAGNOSIS — L308 Other specified dermatitis: Secondary | ICD-10-CM | POA: Diagnosis not present

## 2016-12-31 DIAGNOSIS — E785 Hyperlipidemia, unspecified: Secondary | ICD-10-CM | POA: Diagnosis not present

## 2016-12-31 DIAGNOSIS — L309 Dermatitis, unspecified: Secondary | ICD-10-CM | POA: Insufficient documentation

## 2016-12-31 LAB — LIPID PANEL
CHOLESTEROL: 240 mg/dL — AB (ref 0–200)
HDL: 46.3 mg/dL (ref >39.00)
LDL CALC: 169 mg/dL — AB (ref 0–99)
NonHDL: 193.91
TRIGLYCERIDES: 127 mg/dL (ref 0.0–149.0)
Total CHOL/HDL Ratio: 5
VLDL: 25.4 mg/dL (ref 0.0–40.0)

## 2016-12-31 MED ORDER — ELETONE EX CREA: 1.0000 | TOPICAL_CREAM | Freq: Two times a day (BID) | CUTANEOUS | 5 refills | Status: DC

## 2016-12-31 NOTE — Patient Instructions (Signed)

## 2016-12-31 NOTE — Progress Notes (Signed)
Subjective:  Patient ID: Sean Cochran, male    DOB: [birth year]  Age: 56 y.o. MRN: [id number]  CC: Rash and Fatigue   HPI Sean Cochran presents for f/up on chronic fatigue.  He tells me since I last saw him his fatigue has actually improved.  He also complains of intermittent sensation of dry itchy skin around his ankles.  He does not see a rash but does feel the urge to scratch the area.  Outpatient Medications Prior to Visit  Medication Sig Dispense Refill  . albuterol (PROVENTIL HFA;VENTOLIN HFA) 108 (90 Base) MCG/ACT inhaler Inhale into the lungs.    . triamcinolone cream (KENALOG) 0.1 % Apply topically.     No facility-administered medications prior to visit.     ROS Review of Systems  Constitutional: Positive for fatigue. Negative for diaphoresis and unexpected weight change.  HENT: Negative.  Negative for sore throat and trouble swallowing.   Eyes: Negative.   Respiratory: Negative.  Negative for cough, chest tightness, shortness of breath and wheezing.   Cardiovascular: Negative.  Negative for chest pain, palpitations and leg swelling.  Gastrointestinal: Negative.  Negative for abdominal pain, constipation, diarrhea, nausea and vomiting.  Endocrine: Negative for cold intolerance and heat intolerance.  Genitourinary: Negative.  Negative for difficulty urinating and urgency.  Musculoskeletal: Negative.   Skin: Negative.  Negative for color change and rash.  Allergic/Immunologic: Negative.   Neurological: Negative.  Negative for dizziness, weakness and headaches.  Hematological: Negative for adenopathy. Does not bruise/bleed easily.  Psychiatric/Behavioral: Negative.  Negative for decreased concentration, dysphoric mood and sleep disturbance. The patient is not nervous/anxious.     Objective:  BP 118/78 (BP Location: Left Arm, Patient Position: Sitting, Cuff Size: Normal)   Pulse 70   Temp 98.2 F (36.8 C) (Oral)   Ht 5' 11"  (1.803 m)   Wt 189 lb 4 oz (85.8 kg)    SpO2 98%   BMI 26.40 kg/m   BP Readings from Last 3 Encounters:  12/31/16 118/78  06/26/16 120/80  04/26/16 108/90    Wt Readings from Last 3 Encounters:  12/31/16 189 lb 4 oz (85.8 kg)  06/26/16 188 lb (85.3 kg)  04/26/16 185 lb (83.9 kg)    Physical Exam  Constitutional: He is oriented to person, place, and time. No distress.  HENT:  Mouth/Throat: Oropharynx is clear and moist. No oropharyngeal exudate.  Eyes: Conjunctivae are normal. Right eye exhibits no discharge. Left eye exhibits no discharge. No scleral icterus.  Neck: Normal range of motion. Neck supple. No JVD present. No thyromegaly present.  Cardiovascular: Normal rate, regular rhythm and intact distal pulses.  Exam reveals no gallop and no friction rub.   No murmur heard. Pulmonary/Chest: Effort normal and breath sounds normal. No respiratory distress. He has no wheezes. He has no rales. He exhibits no tenderness.  Abdominal: Soft. Bowel sounds are normal. He exhibits no distension and no mass. There is no tenderness. There is no rebound and no guarding.  Musculoskeletal: Normal range of motion. He exhibits no edema, tenderness or deformity.  Lymphadenopathy:    He has no cervical adenopathy.  Neurological: He is alert and oriented to person, place, and time.  Skin: Skin is warm and dry. No rash noted. He is not diaphoretic. No erythema. No pallor.  There is no rash  Vitals reviewed.   Lab Results  Component Value Date   WBC 4.7 06/26/2016   HGB 15.4 06/26/2016   HCT 44.6 06/26/2016   PLT 206.0  06/26/2016   GLUCOSE 97 06/26/2016   CHOL 240 (H) 12/31/2016   TRIG 127.0 12/31/2016   HDL 46.30 12/31/2016   LDLCALC 169 (H) 12/31/2016   ALT 45 06/26/2016   AST 25 06/26/2016   NA 139 06/26/2016   K 4.3 06/26/2016   CL 103 06/26/2016   CREATININE 1.00 06/26/2016   BUN 12 06/26/2016   CO2 32 06/26/2016   TSH 2.46 12/31/2016    Mr Brain Wo Contrast  Result Date: 11/02/2014 CLINICAL DATA:  56 year old  male with mild gait issues. Nonspecific white matter type changes on MR. Evaluate for possible demyelinating process. History of pineal cyst and hyperlipidemia. Subsequent encounter. EXAM: MRI HEAD WITHOUT CONTRAST TECHNIQUE: Multiplanar, multiecho pulse sequences of the brain and surrounding structures were obtained without intravenous contrast. COMPARISON:  03/16/2012 brain MR. 10/22/2005 head CT. FINDINGS: No acute infarct. No intracranial hemorrhage. Mild progression of scattered nonspecific punctate and patchy white matter type changes predominantly supratentorial region and greater on the left. Considerations include changes secondary to demyelinating process, inflammatory process, vasculitis, small vessel disease, result of prior infection/trauma and less likely migraine headaches. No specific findings such as temporal lobe involvement to suggest underlying CADASIL (cerebral autosomal dominant arteriopathy with subcortical infarcts and leukoencephalopathy). Global atrophy without hydrocephalus. Calcified pineal gland with small cysts without associated mass effect appears stable. Ossification anterior falx incidentally noted and unchanged. Major intracranial vascular structures are patent. Mild prominence basilar tip appears stable. Cervical medullary junction, pituitary region and orbital structures unremarkable. IMPRESSION: Mild progression of scattered nonspecific punctate and patchy white matter type changes. Considerations include changes secondary to demyelinating process, inflammatory process, vasculitis, small vessel disease, result of prior infection/trauma and less likely migraine headaches. Global atrophy without hydrocephalus. Remainder of findings without significant change as noted above. Electronically Signed   By: Genia Del M.D.   On: 11/02/2014 06:17    Assessment & Plan:   Sean Cochran was seen today for rash and fatigue.  Diagnoses and all orders for this visit:  Hyperlipidemia LDL  goal <160- he does not have an elevated ASCVD risk score so I do not recommend that he take a statin for CV risk reduction. -     Lipid panel; Future -     Thyroid Panel With TSH; Future  Other fatigue- his fatigue is improving and his labs are negative for any secondary causes.  Will continue to monitor this in the future. -     Thyroid Panel With TSH; Future -     Testosterone Total,Free,Bio, Males; Future  Other eczema- I have asked him to try a barrier ointment to relieve the symptoms. -     Dermatological Products, Misc. (ELETONE) CREA; Apply 1 Act topically 2 (two) times daily.   I have discontinued Mr. Brassell triamcinolone cream. I am also having him start on ELETONE. Additionally, I am having him maintain his albuterol, finasteride, and sildenafil.  Meds ordered this encounter  Medications  . finasteride (PROPECIA) 1 MG tablet    Sig: Take 1 mg by mouth daily.    Refill:  4  . sildenafil (REVATIO) 20 MG tablet    Sig: TAKE 2 TO 5 TABLETS BY MOUTH DAILY AS NEEDED    Refill:  3  . Dermatological Products, Misc. (ELETONE) CREA    Sig: Apply 1 Act topically 2 (two) times daily.    Dispense:  100 g    Refill:  5     Follow-up: Return in about 6 months (around 07/01/2017).  Scarlette Calico,  MD

## 2017-01-01 ENCOUNTER — Encounter: Payer: Self-pay | Admitting: Internal Medicine

## 2017-01-01 LAB — THYROID PANEL WITH TSH
Free Thyroxine Index: 1.2 — ABNORMAL LOW (ref 1.4–3.8)
T3 Uptake: 38 % — ABNORMAL HIGH (ref 22–35)
T4, Total: 3.2 ug/dL — ABNORMAL LOW (ref 4.9–10.5)
TSH: 2.46 mIU/L (ref 0.40–4.50)

## 2017-01-01 LAB — TESTOSTERONE TOTAL,FREE,BIO, MALES
ALBUMIN MSPROF: 4.7 g/dL (ref 3.6–5.1)
Sex Hormone Binding: 32 nmol/L (ref 10–50)
TESTOSTERONE BIOAVAILABLE: 133.1 ng/dL (ref >=110.0)
TESTOSTERONE: 460 ng/dL (ref 250–827)
Testosterone, Free: 62.1 pg/mL (ref 46.0–224.0)

## 2017-01-25 DIAGNOSIS — H5213 Myopia, bilateral: Secondary | ICD-10-CM | POA: Diagnosis not present

## 2017-03-26 ENCOUNTER — Encounter: Payer: Self-pay | Admitting: Internal Medicine

## 2017-03-26 ENCOUNTER — Ambulatory Visit: Payer: 59 | Admitting: Internal Medicine

## 2017-03-26 DIAGNOSIS — J069 Acute upper respiratory infection, unspecified: Secondary | ICD-10-CM | POA: Insufficient documentation

## 2017-03-26 DIAGNOSIS — J4521 Mild intermittent asthma with (acute) exacerbation: Secondary | ICD-10-CM

## 2017-03-26 DIAGNOSIS — J45909 Unspecified asthma, uncomplicated: Secondary | ICD-10-CM | POA: Insufficient documentation

## 2017-03-26 MED ORDER — AZITHROMYCIN 250 MG PO TABS: ORAL_TABLET | ORAL | 0 refills | Status: DC

## 2017-03-26 MED ORDER — PROMETHAZINE-CODEINE 6.25-10 MG/5ML PO SYRP: 5.0000 mL | ORAL_SOLUTION | ORAL | 0 refills | Status: DC | PRN

## 2017-03-26 MED ORDER — ALBUTEROL SULFATE HFA 108 (90 BASE) MCG/ACT IN AERS: 2.0000 | INHALATION_SPRAY | Freq: Four times a day (QID) | RESPIRATORY_TRACT | 6 refills | Status: DC | PRN

## 2017-03-26 NOTE — Assessment & Plan Note (Addendum)
Prom-cod syr Rx Proventil MDI CXR if worse or if not better

## 2017-03-26 NOTE — Progress Notes (Signed)
Subjective:  Patient ID: Sean Cochran, male    DOB: 01/29/1961  Age: 57 y.o. MRN: [id number]  CC: No chief complaint on file.   HPI [name] presents for URI sx's x 7 days. Cough is worse. H/o asthma. Not better w/OTC meds  Outpatient Medications Prior to Visit  Medication Sig Dispense Refill  . albuterol (PROVENTIL HFA;VENTOLIN HFA) 108 (90 Base) MCG/ACT inhaler Inhale into the lungs.    . Dermatological Products, Misc. (ELETONE) CREA Apply 1 Act topically 2 (two) times daily. 100 g 5  . finasteride (PROPECIA) 1 MG tablet Take 1 mg by mouth daily.  4  . sildenafil (REVATIO) 20 MG tablet TAKE 2 TO 5 TABLETS BY MOUTH DAILY AS NEEDED  3   No facility-administered medications prior to visit.     ROS Review of Systems  Constitutional: Positive for fatigue. Negative for appetite change and unexpected weight change.  HENT: Positive for congestion, postnasal drip, rhinorrhea, sinus pressure, sinus pain and sore throat. Negative for nosebleeds, sneezing and trouble swallowing.   Eyes: Negative for itching and visual disturbance.  Respiratory: Positive for cough.   Cardiovascular: Negative for chest pain, palpitations and leg swelling.  Gastrointestinal: Negative for abdominal distention, blood in stool, diarrhea and nausea.  Genitourinary: Negative for frequency and hematuria.  Musculoskeletal: Negative for back pain, gait problem, joint swelling and neck pain.  Skin: Negative for rash.  Neurological: Negative for dizziness, tremors, speech difficulty and weakness.  Psychiatric/Behavioral: Negative for agitation, dysphoric mood and sleep disturbance. The patient is not nervous/anxious.     Objective:  BP 110/70 (BP Location: Left Arm, Patient Position: Sitting, Cuff Size: Normal)   Pulse 97   Temp 98.4 F (36.9 C) (Oral)   Ht 5' 11"  (1.803 m)   Wt 192 lb (87.1 kg)   SpO2 98%   BMI 26.78 kg/m   BP Readings from Last 3 Encounters:  03/26/17 110/70  12/31/16 118/78    06/26/16 120/80    Wt Readings from Last 3 Encounters:  03/26/17 192 lb (87.1 kg)  12/31/16 189 lb 4 oz (85.8 kg)  06/26/16 188 lb (85.3 kg)    Physical Exam  Constitutional: He is oriented to person, place, and time. He appears well-developed. No distress.  NAD  HENT:  Mouth/Throat: Oropharynx is clear and moist.  Eyes: Conjunctivae are normal. Pupils are equal, round, and reactive to light.  Neck: Normal range of motion. No JVD present. No thyromegaly present.  Cardiovascular: Normal rate, regular rhythm, normal heart sounds and intact distal pulses. Exam reveals no gallop and no friction rub.  No murmur heard. Pulmonary/Chest: Effort normal and breath sounds normal. No respiratory distress. He has no wheezes. He has no rales. He exhibits no tenderness.  Abdominal: Soft. Bowel sounds are normal. He exhibits no distension and no mass. There is no tenderness. There is no rebound and no guarding.  Musculoskeletal: Normal range of motion. He exhibits no edema or tenderness.  Lymphadenopathy:    He has no cervical adenopathy.  Neurological: He is alert and oriented to person, place, and time. He has normal reflexes. No cranial nerve deficit. He exhibits normal muscle tone. He displays a negative Romberg sign. Coordination and gait normal.  Skin: Skin is warm and dry. No rash noted.  Psychiatric: He has a normal mood and affect. His behavior is normal. Judgment and thought content normal.  eryth throat  Lab Results  Component Value Date   WBC 4.7 06/26/2016   HGB 15.4  06/26/2016   HCT 44.6 06/26/2016   PLT 206.0 06/26/2016   GLUCOSE 97 06/26/2016   CHOL 240 (H) 12/31/2016   TRIG 127.0 12/31/2016   HDL 46.30 12/31/2016   LDLCALC 169 (H) 12/31/2016   ALT 45 06/26/2016   AST 25 06/26/2016   NA 139 06/26/2016   K 4.3 06/26/2016   CL 103 06/26/2016   CREATININE 1.00 06/26/2016   BUN 12 06/26/2016   CO2 32 06/26/2016   TSH 2.46 12/31/2016    Mr Brain Wo Contrast  Result  Date: 11/02/2014 CLINICAL DATA:  57 year old male with mild gait issues. Nonspecific white matter type changes on MR. Evaluate for possible demyelinating process. History of pineal cyst and hyperlipidemia. Subsequent encounter. EXAM: MRI HEAD WITHOUT CONTRAST TECHNIQUE: Multiplanar, multiecho pulse sequences of the brain and surrounding structures were obtained without intravenous contrast. COMPARISON:  03/16/2012 brain MR. 10/22/2005 head CT. FINDINGS: No acute infarct. No intracranial hemorrhage. Mild progression of scattered nonspecific punctate and patchy white matter type changes predominantly supratentorial region and greater on the left. Considerations include changes secondary to demyelinating process, inflammatory process, vasculitis, small vessel disease, result of prior infection/trauma and less likely migraine headaches. No specific findings such as temporal lobe involvement to suggest underlying CADASIL (cerebral autosomal dominant arteriopathy with subcortical infarcts and leukoencephalopathy). Global atrophy without hydrocephalus. Calcified pineal gland with small cysts without associated mass effect appears stable. Ossification anterior falx incidentally noted and unchanged. Major intracranial vascular structures are patent. Mild prominence basilar tip appears stable. Cervical medullary junction, pituitary region and orbital structures unremarkable. IMPRESSION: Mild progression of scattered nonspecific punctate and patchy white matter type changes. Considerations include changes secondary to demyelinating process, inflammatory process, vasculitis, small vessel disease, result of prior infection/trauma and less likely migraine headaches. Global atrophy without hydrocephalus. Remainder of findings without significant change as noted above. Electronically Signed   By: Genia Del M.D.   On: 11/02/2014 06:17    Assessment & Plan:   There are no diagnoses linked to this encounter. I am having  Phenix L. Montagna maintain his albuterol, finasteride, sildenafil, and ELETONE.  No orders of the defined types were placed in this encounter.    Follow-up: No Follow-up on file.  Walker Kehr, MD

## 2017-03-26 NOTE — Assessment & Plan Note (Addendum)
Zpac Prom-cod syr Rx Proventil MDI

## 2017-03-26 NOTE — Patient Instructions (Addendum)
You can use over-the-counter  "cold" medicines  such as "Tylenol cold" , "Advil cold",  "Mucinex" or" Mucinex D" , Theraflu for cough and congestion.   Avoid decongestants if you have high blood pressure and use "Afrin" nasal spray for nasal congestion as directed. Use " Delsym" or" Robitussin" cough syrup varietis for cough.  You can use plain "Tylenol" or "Advil" for fever, chills and achyness. Use Halls or Ricola cough drops.     Please, make an appointment if you are not better or if you're worse.

## 2017-09-03 ENCOUNTER — Encounter: Payer: Self-pay | Admitting: Internal Medicine

## 2017-09-04 ENCOUNTER — Other Ambulatory Visit: Payer: Self-pay | Admitting: Internal Medicine

## 2017-09-04 DIAGNOSIS — L649 Androgenic alopecia, unspecified: Secondary | ICD-10-CM

## 2017-09-04 MED ORDER — FINASTERIDE 1 MG PO TABS: 1.0000 mg | ORAL_TABLET | Freq: Every day | ORAL | 1 refills | Status: DC

## 2017-10-17 ENCOUNTER — Telehealth: Payer: Self-pay | Admitting: Sports Medicine

## 2017-10-17 NOTE — Telephone Encounter (Signed)
The patient received some green inserts last year at his visit and wanted to see if he could get some more, or buy some?

## 2017-11-19 DIAGNOSIS — N401 Enlarged prostate with lower urinary tract symptoms: Secondary | ICD-10-CM | POA: Diagnosis not present

## 2017-11-19 DIAGNOSIS — R351 Nocturia: Secondary | ICD-10-CM | POA: Diagnosis not present

## 2017-11-28 DIAGNOSIS — N5201 Erectile dysfunction due to arterial insufficiency: Secondary | ICD-10-CM | POA: Diagnosis not present

## 2017-11-28 DIAGNOSIS — Z125 Encounter for screening for malignant neoplasm of prostate: Secondary | ICD-10-CM | POA: Diagnosis not present

## 2018-02-20 DIAGNOSIS — H5213 Myopia, bilateral: Secondary | ICD-10-CM | POA: Diagnosis not present

## 2018-06-04 ENCOUNTER — Encounter: Payer: Self-pay | Admitting: Internal Medicine

## 2018-06-11 ENCOUNTER — Ambulatory Visit (INDEPENDENT_AMBULATORY_CARE_PROVIDER_SITE_OTHER): Payer: 59 | Admitting: Internal Medicine

## 2018-06-11 ENCOUNTER — Encounter: Payer: Self-pay | Admitting: Internal Medicine

## 2018-06-11 DIAGNOSIS — E785 Hyperlipidemia, unspecified: Secondary | ICD-10-CM | POA: Diagnosis not present

## 2018-06-11 DIAGNOSIS — R945 Abnormal results of liver function studies: Secondary | ICD-10-CM

## 2018-06-11 DIAGNOSIS — R7989 Other specified abnormal findings of blood chemistry: Secondary | ICD-10-CM

## 2018-06-11 NOTE — Progress Notes (Signed)
Virtual Visit via Video Note  I connected with Sean Cochran on 06/11/18 at  2:00 PM EDT by a video enabled telemedicine application and verified that I am speaking with the correct person using two identifiers.   I discussed the limitations of evaluation and management by telemedicine and the availability of in person appointments. The patient expressed understanding and agreed to proceed.  History of Present Illness: The patient checked in for virtual visit.  He was not willing to come in because of the coronavirus pandemic.  See the concerns and issues listed below in an email that I received from him about 5 days ago.  The labs listed below were done about 3 weeks ago.  He felt well at the time with no abdominal pain, nausea, vomiting, icterus, rash, weight loss, diarrhea, or loss of appetite.  He complains of chronic arthralgias and fatigue.  He has stopped taking finasteride because he thought it might be implicated.  He only drinks about 3 glasses of red wine 5 times a week.  He does not take any over-the-counter meds like anti-inflammatories or Tylenol.  There is no family history of liver disease.  His father died 13 years ago and had type 2 diabetes mellitus.  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 I have a question that I wanted to get your input on. I recently applied for life insurance and had a blood test as part of the process. I got a denial letter today, stating that my cholesterol and liver enzymes were too high (copy attached). My cholesterol showed 287 (55 HDL/205 LDL), and my liver enzymes showed GGT/GGTP at 389 (range of 2-65), and the ALT/SGPT was 71 (range of 0-45). I looked at the past blood tests in my history but they don't appear to have measured liver enzymes.    This happened before, but I can't find those test results, and is something I would like to follow up on. I looked online and have some questions, but I'm not  sure if it's possible to speak with you, or the time frame we are looking at to do any follow up blood work/consult, with the COVID-19 situation.      Observations/Objective: He looked well on the video feed.  There was no icterus.  He had good color.  He was alert, oriented, well-groomed, and was in no distress.   Assessment and Plan: I will recheck his liver enzymes and will screen him for hemochromatosis, Wilson's disease, autoimmune hepatitis, drug-induced hepatitis, viral hepatitis, alcoholic hepatitis, and fatty liver disease.   Follow Up Instructions: He agrees to come in and have the lab work completed.  He will let me know if he develops any new or worsening symptoms.  If the liver enzymes remained elevated and the labs do not indicate a cause for the hepatitis then I will consider doing an ultrasound of his liver to screen for fatty liver disease.    I discussed the assessment and treatment plan with the patient. The patient was provided an opportunity to ask questions and all were answered. The patient agreed with the plan and demonstrated an understanding of the instructions.   The patient was advised to call back or seek an in-person evaluation if the symptoms worsen or if the condition fails to improve as anticipated.  I provided 25 minutes of non-face-to-face time during this encounter.   Scarlette Calico, MD

## 2018-06-12 ENCOUNTER — Other Ambulatory Visit (INDEPENDENT_AMBULATORY_CARE_PROVIDER_SITE_OTHER): Payer: 59

## 2018-06-12 ENCOUNTER — Encounter: Payer: Self-pay | Admitting: Internal Medicine

## 2018-06-12 DIAGNOSIS — E785 Hyperlipidemia, unspecified: Secondary | ICD-10-CM

## 2018-06-12 DIAGNOSIS — R945 Abnormal results of liver function studies: Secondary | ICD-10-CM

## 2018-06-12 DIAGNOSIS — R7989 Other specified abnormal findings of blood chemistry: Secondary | ICD-10-CM

## 2018-06-12 LAB — CBC WITH DIFFERENTIAL/PLATELET
Basophils Absolute: 0.1 10*3/uL (ref 0.0–0.1)
Basophils Relative: 3.1 % — ABNORMAL HIGH (ref 0.0–3.0)
Eosinophils Absolute: 0.4 10*3/uL (ref 0.0–0.7)
Eosinophils Relative: 9 % — ABNORMAL HIGH (ref 0.0–5.0)
HCT: 44.6 % (ref 39.0–52.0)
Hemoglobin: 15.6 g/dL (ref 13.0–17.0)
Lymphocytes Relative: 27.3 % (ref 12.0–46.0)
Lymphs Abs: 1.2 10*3/uL (ref 0.7–4.0)
MCHC: 35 g/dL (ref 30.0–36.0)
MCV: 89.4 fl (ref 78.0–100.0)
Monocytes Absolute: 0.4 10*3/uL (ref 0.1–1.0)
Monocytes Relative: 10.5 % (ref 3.0–12.0)
Neutro Abs: 2.1 10*3/uL (ref 1.4–7.7)
Neutrophils Relative %: 50.1 % (ref 43.0–77.0)
Platelets: 197 10*3/uL (ref 150.0–400.0)
RBC: 4.99 Mil/uL (ref 4.22–5.81)
RDW: 13.2 % (ref 11.5–15.5)
WBC: 4.2 10*3/uL (ref 4.0–10.5)

## 2018-06-12 LAB — LIPID PANEL
Cholesterol: 226 mg/dL — ABNORMAL HIGH (ref 0–200)
HDL: 39.8 mg/dL (ref >39.00)
LDL Cholesterol: 162 mg/dL — ABNORMAL HIGH (ref 0–99)
NonHDL: 185.87
Total CHOL/HDL Ratio: 6
Triglycerides: 121 mg/dL (ref 0.0–149.0)
VLDL: 24.2 mg/dL (ref 0.0–40.0)

## 2018-06-12 LAB — BASIC METABOLIC PANEL
BUN: 17 mg/dL (ref 6–23)
CO2: 29 mEq/L (ref 19–32)
Calcium: 9.7 mg/dL (ref 8.4–10.5)
Chloride: 102 mEq/L (ref 96–112)
Creatinine, Ser: 0.99 mg/dL (ref 0.40–1.50)
GFR: 78 mL/min (ref >60.00)
Glucose, Bld: 88 mg/dL (ref 70–99)
Potassium: 4 mEq/L (ref 3.5–5.1)
Sodium: 139 mEq/L (ref 135–145)

## 2018-06-12 LAB — TSH: TSH: 3.72 u[IU]/mL (ref 0.35–4.50)

## 2018-06-12 LAB — IBC PANEL
Iron: 121 ug/dL (ref 42–165)
Saturation Ratios: 33.1 % (ref 20.0–50.0)
Transferrin: 261 mg/dL (ref 212.0–360.0)

## 2018-06-12 LAB — HEPATIC FUNCTION PANEL
ALT: 45 U/L (ref 0–53)
AST: 27 U/L (ref 0–37)
Albumin: 4.8 g/dL (ref 3.5–5.2)
Alkaline Phosphatase: 66 U/L (ref 39–117)
Bilirubin, Direct: 0.1 mg/dL (ref 0.0–0.3)
Total Bilirubin: 1 mg/dL (ref 0.2–1.2)
Total Protein: 7.1 g/dL (ref 6.0–8.3)

## 2018-06-12 LAB — PROTIME-INR
INR: 1.1 ratio — ABNORMAL HIGH (ref 0.8–1.0)
Prothrombin Time: 12.4 s (ref 9.6–13.1)

## 2018-06-12 LAB — GAMMA GT: GGT: 229 U/L — ABNORMAL HIGH (ref 7–51)

## 2018-06-12 LAB — FERRITIN: Ferritin: 141.6 ng/mL (ref 22.0–322.0)

## 2018-06-15 ENCOUNTER — Encounter: Payer: Self-pay | Admitting: Internal Medicine

## 2018-06-16 ENCOUNTER — Ambulatory Visit (INDEPENDENT_AMBULATORY_CARE_PROVIDER_SITE_OTHER): Payer: 59

## 2018-06-16 ENCOUNTER — Other Ambulatory Visit: Payer: Self-pay

## 2018-06-16 DIAGNOSIS — Z23 Encounter for immunization: Secondary | ICD-10-CM

## 2018-06-17 ENCOUNTER — Encounter: Payer: Self-pay | Admitting: Internal Medicine

## 2018-06-17 ENCOUNTER — Other Ambulatory Visit: Payer: Self-pay | Admitting: Internal Medicine

## 2018-06-17 DIAGNOSIS — R945 Abnormal results of liver function studies: Secondary | ICD-10-CM

## 2018-06-17 DIAGNOSIS — K7581 Nonalcoholic steatohepatitis (NASH): Secondary | ICD-10-CM | POA: Insufficient documentation

## 2018-06-17 DIAGNOSIS — R7989 Other specified abnormal findings of blood chemistry: Secondary | ICD-10-CM

## 2018-06-17 LAB — PROTEIN ELECTROPHORESIS, SERUM
Albumin ELP: 4.6 g/dL (ref 3.8–4.8)
Alpha 1: 0.2 g/dL (ref 0.2–0.3)
Alpha 2: 0.5 g/dL (ref 0.5–0.9)
Beta 2: 0.3 g/dL (ref 0.2–0.5)
Beta Globulin: 0.4 g/dL (ref 0.4–0.6)
Gamma Globulin: 0.8 g/dL (ref 0.8–1.7)
Total Protein: 6.8 g/dL (ref 6.1–8.1)

## 2018-06-17 LAB — ANTI-NUCLEAR AB-TITER (ANA TITER): ANA Titer 1: 1:40 {titer} — ABNORMAL HIGH

## 2018-06-17 LAB — HEMOCHROMATOSIS DNA-PCR(C282Y,H63D)

## 2018-06-17 LAB — HEPATITIS C ANTIBODY
Hepatitis C Ab: NONREACTIVE
SIGNAL TO CUT-OFF: 0 (ref <1.00)

## 2018-06-17 LAB — HEPATITIS B CORE ANTIBODY, TOTAL: Hep B Core Total Ab: NONREACTIVE

## 2018-06-17 LAB — ANTI-SMOOTH MUSCLE ANTIBODY, IGG: Actin (Smooth Muscle) Antibody (IGG): <20 U (ref <20)

## 2018-06-17 LAB — CERULOPLASMIN: Ceruloplasmin: 25 mg/dL (ref 18–36)

## 2018-06-17 LAB — HEPATITIS A ANTIBODY, TOTAL: Hepatitis A AB,Total: NONREACTIVE

## 2018-06-17 LAB — HEPATITIS B SURFACE ANTIBODY,QUALITATIVE: Hep B S Ab: NONREACTIVE

## 2018-06-17 LAB — ANA: Anti Nuclear Antibody (ANA): POSITIVE — AB

## 2018-06-17 LAB — ANTI-MICROSOMAL ANTIBODY LIVER / KIDNEY: LKM1 Ab: <=20 U (ref <=20.0)

## 2018-06-19 ENCOUNTER — Other Ambulatory Visit: Payer: Self-pay

## 2018-06-19 ENCOUNTER — Encounter: Payer: Self-pay | Admitting: Gastroenterology

## 2018-06-19 ENCOUNTER — Ambulatory Visit (INDEPENDENT_AMBULATORY_CARE_PROVIDER_SITE_OTHER): Payer: 59 | Admitting: Gastroenterology

## 2018-06-19 VITALS — Ht 71.0 in | Wt 190.0 lb

## 2018-06-19 DIAGNOSIS — Z8379 Family history of other diseases of the digestive system: Secondary | ICD-10-CM | POA: Diagnosis not present

## 2018-06-19 DIAGNOSIS — R748 Abnormal levels of other serum enzymes: Secondary | ICD-10-CM

## 2018-06-19 NOTE — Patient Instructions (Addendum)
I have recommended some additional labs to evaluate for other causes of autoimmune disease, insulin resistance, and celiac disease.   Please have an abdominal ultrasound.  I recommend an office visit after the ultrasound and lab results are available.  Love your liver!      - Abstain from all alcohol including beer, wine, liquor, and non-alcoholic beer.    - Consider drinking 2-3 9 ounce cups of regular brewed coffee every day. Although limit your daily caffeine intake to no more than 400 mg.      - Good sleep is important for your liver. Improve sleep hygiene to match sleep and wake times during workdays and weekends.  Sleep at least 7-9 hours every night. Use blackout curtains, turn off lights at bedtime, and limit nighttime use of electronic devices and caffeine.     - Complete vaccination for hepatitis A and hepatitis B  Thank you for your patience with me and our technology today! Please stay home, safe, and healthy. I look forward to meeting you in person in the future.

## 2018-06-19 NOTE — Progress Notes (Signed)
TELEHEALTH VISIT  Referring Provider: Janith Lima, MD Primary Care Physician:  Janith Lima, MD   Tele-visit due to COVID-19 pandemic Patient requested visit virtually, consented to the virtual encounter via video enabled telemedicine application (Zoom) Contact made at: 14:32 06/19/18 Patient verified by name and date of birth Location of patient: Home Location provider: [place] [facility] Names of persons participating: Me, patient, Tinnie Gens CMA Time spent on telehealth visit: 48 minutes I discussed the limitations of evaluation and management by telemedicine. The patient expressed understanding and agreed to proceed.  Reason for Consultation:  Abnormal liver enzymes   IMPRESSION:  Elevated ALT 45-71 Elevated GGTP ANA + 1:40 in a speckled pattern    - Smooth muscle Ab negative    - LKM antibody negative BMI 27 Hypercholesterolemia History of colon polyp on colonoscopy in 2014    -Surveillance recommended in 5 years Family history of celiac disease (niece and nephew)  ALT was formally elevated on labs by [facility]. But, ALT should be 30 for a healthy man. So, his ALT has been elevated 1.5-2 x normal over the last few years. GGTP is not helpful in understanding hepatic inflammation or damage. It is no longer thought to be helpful in diagnosing or monitoring for alcohol-related disease.     Dr. Ronnald Ramp has performed an extensive evaluation for common causes of liver disease that has been normal/negative including chronic viral hepatitis, hemochromatosis, Wilson's disease, Type 1 and 3 autoimmune hepatitis, and thyroid dysfunction.  Drug induced liver injury must be considered, and may explain the transient elevation in the ALT. There have been published reports of transient serum enzyme elevations occurring during finasteride therapy, but none of clinically apparent liver injury. Ibuprofen has rarely been associated with elevated transaminases, as has  Sudafed.   Other possibilities include fatty liver, ANA negative autoimmune hepatitis, bile duct abnormalities, celiac disease and liver lesions. Given this differential, I am recommending additional labs and a liver ultrasound to evaluate the parenchyma.   He has no stigmata for chronic liver disease based on my limited examination.  Platelets are 200,000.  PLAN: IgG, IgM, AMA, ANCA, fastin insulin, fastin glucose Celiac panel Abdominal ultrasound Office visit after the ultrasound Complete Twinrix vaccination Reviewed healthy liver lifestyle choices including abstinence from alcohol  Avoid hepatotoxic drugs Obtain prior colonoscopy report from 2014/2015 Surveillance colonoscopy is likely due. Unclear if he would prefer to have this at [facility] or with [facility]. Will discuss during his next office visit.      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  The history is obtained through the patient and review of his electronic health record.  I have also reviewed his history and CareEverywhere.  History history of hyperlipidemia.  He was found to have an elevated ALT at 71 and an elevated GGT at 389 during routine blood work obtained as part of an application for life insurance 05/20/18. His AST was 31. He was denied due to the results due to his elevated cholesterol and his GGT.  No symptoms that he attributes to the liver. He reports chronic arthralgias and fatigue, particularly in his hips and elbow. No other associated symptoms. No identified exacerbating or relieving features.   He stopped taking finasteride because it thought it might be related.  Uses no OTC medications except Sudafed or ibuprofen, herbal supplements, or alternative medical therapies.  Donated blood multiple times until he started finasteride.  No prior blood transfusion.  No history of jaundice or scleral icterus.  He drinks 3 glasses of red wine 5  days a week. No history of heavy drinking or DUI.  No history of use or experimentation with IV or intranasal street drugs.  No family history of liver disease.  Liver enzymes were normal 04/20/2013 including an AST 20, ALT 25, alk phos 57, total bilirubin 0.8, albumin 5.0.  The liver enzymes remain normal 06/26/2016 with an AST 25, ALT 45, alk phos 70, total bilirubin 1.0, albumin 4.7.  Labs on 06/12/2018 show an AST 27, ALT 45, alk phos 66, total bilirubin 1.0, direct bilirubin 0.1, albumin 4.8.  GGT noted to be elevated at 209 on a visit with [place] 07/30/2014. Other liver enzymes were normal at that time including AST 24, ALT 34, alk phos 68, TB 0.3, alb 4.8. TSH 2.22.   Labs in April show an ANA of 1:40 in a speckled pattern, anti-smooth muscle IgG less than 20, LK M antibody undetectable, normal serum protein electrophoresis, TSH 3.72, nonreactive hepatitis C antibody, nonreactive hepatitis B surface antibody, nonreactive hepatitis B core antibody total, nonreactive hepatitis A antibody total, negative DNA mutation analysis for hemochromatosis including the C282Y and H63D mutations, GGT 229, INR 1.1, iron 121, ferritin 142, ceruloplasmin 25, platelets 197.    No prior abdominal imaging.  An abdominal ultrasound has been ordered but does not appear to have been scheduled.  Had a colon polyp removed 02/19/2013 with ? [facility].  Surveillance colonoscopy recommended in 5 years.  He started Twinrix vaccination last week.   Paternal aunt with autoimmune disease, Grover's disease, PMR, and CML. Niece and nephew have celiac disease.  Father had lymphoma followed by leukemia. No known family history of colon cancer or polyps. No family history of uterine/endometrial cancer, pancreatic cancer or gastric/stomach cancer.  Past Medical History:  Diagnosis Date   Abnormal finding on MRI of brain 10/04/2014   Back pain    Colon polyp 02/19/2013   Dr. Collene Mares - Repeat in 5 years    Constipation    Hip pain    History of pineal cyst    Hyperlipidemia    Memory loss     Past Surgical History:  Procedure Laterality Date   BICEPS TENDON REPAIR Left    ROTATOR CUFF REPAIR Right    TONSILLECTOMY     VASECTOMY      Current Outpatient Medications  Medication Sig Dispense Refill   Dermatological Products, Misc. (ELETONE) CREA Apply 1 Act topically 2 (two) times daily. 100 g 5   sildenafil (REVATIO) 20 MG tablet TAKE 2 TO 5 TABLETS BY MOUTH DAILY AS NEEDED  3   albuterol (PROAIR HFA) 108 (90 Base) MCG/ACT inhaler Inhale 2 puffs into the lungs every 6 (six) hours as needed for wheezing or shortness of breath. 1 Inhaler 6   finasteride (PROPECIA) 1 MG tablet Take 1 tablet (1 mg total) by mouth daily. (Patient not taking: Reported on 06/19/2018) 90 tablet 1   No current facility-administered medications for this visit.     Allergies as of 06/19/2018   (No Known Allergies)    Family History  Problem Relation Age of Onset   Cancer Father        Lymphoma   Depression Sister    Heart attack Brother    Healthy Brother    Healthy Brother    Cancer Maternal Grandfather        Paternal Grandfather   Prostate  cancer Maternal Grandfather    Prostate cancer Paternal Grandfather     Social History   Socioeconomic History   Marital status: Married    Spouse name: Not on file   Number of children: 2   Years of education: master's   Highest education level: Not on file  Occupational History   Occupation: Chartered certified accountant   Social Needs   Financial resource strain: Not on file   Food insecurity:    Worry: Not on file    Inability: Not on file   Transportation needs:    Medical: Not on file    Non-medical: Not on file  Tobacco Use   Smoking status: Never Smoker   Smokeless tobacco: Never Used  Substance and Sexual Activity   Alcohol use: Yes    Alcohol/week: 11.0 - 12.0 standard drinks    Types: 5 - 6 Standard drinks or  equivalent, 6 Glasses of wine per week   Drug use: No   Sexual activity: Yes  Lifestyle   Physical activity:    Days per week: Not on file    Minutes per session: Not on file   Stress: Not on file  Relationships   Social connections:    Talks on phone: Not on file    Gets together: Not on file    Attends religious service: Not on file    Active member of club or organization: Not on file    Attends meetings of clubs or organizations: Not on file    Relationship status: Not on file   Intimate partner violence:    Fear of current or ex partner: Not on file    Emotionally abused: Not on file    Physically abused: Not on file    Forced sexual activity: Not on file  Other Topics Concern   Not on file  Social History Narrative   Marital Status: Married Radiographer, therapeutic)   Children:  Daughter Apolonio Schneiders); Son Randall Hiss)   Pets:  None    Living Situation: Lives with spouse and children   Occupation: Doctor, 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     Drug Use:  None   Diet:  Regular   Exercise:  Cardio/Weights/Cutting Grass   Hobbies:  Golf, Music, Motorcycles      Patient drinks 2-3 cups of caffeine daily.   Patient is left handed.    Review of Systems: ALL ROS discussed and all others negative except listed in HPI.  Physical Exam: General: in no acute distress HEENT: No scleral icterus, no xanthaloma Neuro: Alert and appropriate Psych: Normal affect and normal insight Skin: No palmar erythema. No Terry's nail.  No spider angiomas on the face.   Andreya Lacks L. Tarri Glenn, MD, MPH [facility] 06/19/2018, 2:07 PM

## 2018-06-20 ENCOUNTER — Other Ambulatory Visit (INDEPENDENT_AMBULATORY_CARE_PROVIDER_SITE_OTHER): Payer: 59

## 2018-06-20 DIAGNOSIS — R748 Abnormal levels of other serum enzymes: Secondary | ICD-10-CM | POA: Diagnosis not present

## 2018-06-20 DIAGNOSIS — Z8379 Family history of other diseases of the digestive system: Secondary | ICD-10-CM | POA: Diagnosis not present

## 2018-06-20 LAB — GLUCOSE, RANDOM: Glucose, Bld: 102 mg/dL — ABNORMAL HIGH (ref 70–99)

## 2018-06-23 ENCOUNTER — Telehealth: Payer: Self-pay

## 2018-06-23 LAB — IGG: IgG (Immunoglobin G), Serum: 915 mg/dL (ref 600–1640)

## 2018-06-23 LAB — IGM: IgM, Serum: 34 mg/dL — ABNORMAL LOW (ref 50–300)

## 2018-06-23 LAB — INSULIN, RANDOM: Insulin: 6.8 u[IU]/mL

## 2018-06-23 LAB — ANCA SCREEN W REFLEX TITER: ANCA Screen: NEGATIVE

## 2018-06-23 NOTE — Telephone Encounter (Signed)
Left message for patient to call back  

## 2018-06-23 NOTE — Telephone Encounter (Signed)
-----   Message from [place], MD sent at 06/23/2018 12:52 PM EDT ----- Please call the patient. His recent labs show a slightly elevated glucose, although his normal insulin is reassuring. These labs do not support a diagnosis of insulin resistance.  His IgM is slightly low, but not low enough to suggest a deficiency. The IgM is usually elevated in some chronic liver diseases, so for what we were evaluating, these are reassuring results. We will wait to see what his ultrasound shows. I have no new recommendations at this time. Thank you.

## 2018-06-23 NOTE — Telephone Encounter (Signed)
See results notes for details.

## 2018-06-24 LAB — CELIAC PANEL 10
Antigliadin Abs, IgA: 2 units (ref 0–19)
Endomysial IgA: NEGATIVE
Gliadin IgG: 2 units (ref 0–19)
IgA/Immunoglobulin A, Serum: 99 mg/dL (ref 90–386)
Tissue Transglut Ab: 5 U/mL (ref 0–5)
Transglutaminase IgA: <2 U/mL (ref 0–3)

## 2018-07-02 ENCOUNTER — Ambulatory Visit
Admission: RE | Admit: 2018-07-02 | Discharge: 2018-07-02 | Disposition: A | Discharge status: Home / Self Care | Payer: 59 | Source: Ambulatory Visit | Attending: Internal Medicine | Admitting: Internal Medicine

## 2018-07-02 ENCOUNTER — Other Ambulatory Visit: Payer: Self-pay

## 2018-07-02 ENCOUNTER — Encounter: Payer: Self-pay | Admitting: Internal Medicine

## 2018-07-02 DIAGNOSIS — R945 Abnormal results of liver function studies: Secondary | ICD-10-CM

## 2018-07-02 DIAGNOSIS — R7989 Other specified abnormal findings of blood chemistry: Secondary | ICD-10-CM

## 2018-07-10 ENCOUNTER — Encounter: Payer: Self-pay | Admitting: Gastroenterology

## 2018-07-10 ENCOUNTER — Other Ambulatory Visit: Payer: Self-pay

## 2018-07-10 ENCOUNTER — Ambulatory Visit (INDEPENDENT_AMBULATORY_CARE_PROVIDER_SITE_OTHER): Payer: 59 | Admitting: Gastroenterology

## 2018-07-10 VITALS — Ht 71.0 in | Wt 190.0 lb

## 2018-07-10 DIAGNOSIS — Z86010 Personal history of colonic polyps: Secondary | ICD-10-CM

## 2018-07-10 DIAGNOSIS — R748 Abnormal levels of other serum enzymes: Secondary | ICD-10-CM | POA: Diagnosis not present

## 2018-07-10 DIAGNOSIS — K625 Hemorrhage of anus and rectum: Secondary | ICD-10-CM | POA: Diagnosis not present

## 2018-07-10 MED ORDER — NA SULFATE-K SULFATE-MG SULF 17.5-3.13-1.6 GM/177ML PO SOLN: 1.0000 | Freq: Once | ORAL | 0 refills | Status: AC

## 2018-07-10 NOTE — Patient Instructions (Addendum)
I am recommending one additional lab test to assess for possible inflammation and damage called a Fibrosure.  Come to our basement lab at your convenience. You do not need an appointment.   I do not think there would be any benefit to a liver biopsy at this time.  Avoid hepatotoxic medications - including finasteride, ibuprofen, and Sudafed. Please talk to Dr. Ronnald Ramp about alternatives.  During your next appointment with Dr. Ronnald Ramp, please ask him about your slightly low IgM.  We will call you to schedule a colonoscopy to evaluate the intermittent bleeding and history of polyps. We will also work to obtain a copy of your results from Dr. Collene Mares.  Please call with any questions or concerns before then.

## 2018-07-10 NOTE — Progress Notes (Signed)
TELEHEALTH VISIT  Referring Provider: Janith Lima, MD Primary Care Physician:  Janith Lima, MD   Tele-visit due to COVID-19 pandemic Patient requested visit virtually, consented to the virtual encounter via video enabled telemedicine application (Zoom) Contact made at: 14:32 06/19/18 Patient verified by name and date of birth Location of patient: Home Location provider: [facility] Names of persons participating: Me, patient, Tinnie Gens CMA Time spent on telehealth visit: 32 minutes I discussed the limitations of evaluation and management by telemedicine. The patient expressed understanding and agreed to proceed.  Reason for Consultation:  Abnormal liver enzymes   IMPRESSION:  Intermittent rectal bleeding Elevated ALT 45-71 Elevated GGTP ANA + 1:40 in a speckled pattern    - Smooth muscle Ab negative    - LKM antibody negative BMI 27 Hypercholesterolemia History of colon polyp on colonoscopy in 2014    -Surveillance recommended in 5 years Family history of celiac disease (niece and nephew)  Evaluation is now negative for many common causes of liver disease including chronic viral hepatitis, hemochromatosis, Wilson's disease, Type 1,2 and 3 autoimmune hepatitis, and thyroid dysfunction, as well as celiac, PBC. There is no evidence for insulin resistance. However, fatty liver is suspected given the concurrent cholesterol abnormalities. Drug induced liver injury must also be considered, although low likelihood with finasteride, ibuprofen, and Sudafed. A liver biopsy may be low yield given the mild elevation in the ALT. I am recommending a Fibrosure to better quantify the extent of both inflammation and possible fibrosis.  He has recently had rectal bleeding that he has attributed to internal hemorrhoids.  Colonoscopy recommended for further evaluation.  He is already due surveillance colonoscopy and asked that he have that performed at low [facility]  gastroenterology instead of having multiple gastroenterologist involved in his care.   PLAN: Fibrosure Reviewed healthy liver lifestyle choices including abstinence from alcohol  Avoid hepatotoxic medications - including finasteride, ibuprofen, and Sudafed Obtain prior colonoscopy and path results from Dr. Collene Mares Colonoscopy for bleeding and history of polyps  I consented the patient discussing the risks, benefits, and alternatives to endoscopic evaluation. In particular, we discussed the risks that include, but are not limited to, reaction to medication, cardiopulmonary compromise, bleeding requiring blood transfusion, aspiration resulting in pneumonia, perforation requiring surgery, lack of diagnosis, severe illness requiring hospitalization, and even death. We reviewed the risk of missed lesion including polyps or even cancer. The patient acknowledges these risks and asks that we proceed.     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 The interval history is obtained through the patient and review of his electronic health record.   Since his last visit he reviewed some prior records and found that he had similar findings in 2002 when he applied for chronic care insurance.   Labs 06/20/18 show a normal IgG, ANCA. Fasting insulin 6.8. Glucose 102,  Testing was negative for celiac. I calculated his HOMA score to be 1.7. His IgM was low at 34.   Abdominal ultrasound 07/02/2018 showed no focal liver lesion.  The parenchyma appears normal including the echogenicity.  The portal vein is patent.  There is normal Doppler flow.  He remains symptom free.  He continues to drink alcohol.  Colonoscopy 10 years ago. Occasional BRB since then. Feels a fullness in the way of defecation. Sense of incomplete evacuation. Prone towards constipation. Uses Citrucel daily. Can go 2-3 days without symptoms.  Colonoscopy with Dr. Collene Mares  in 2014. He had  polyps at that time. No other associated symptoms. No identified exacerbating or relieving features.     Past Medical History:  Diagnosis Date   Abnormal finding on MRI of brain 10/04/2014   Back pain    Colon polyp 02/19/2013   Dr. Collene Mares - Repeat in 5 years   Constipation    Hip pain    History of pineal cyst    Hyperlipidemia    Memory loss     Past Surgical History:  Procedure Laterality Date   BICEPS TENDON REPAIR Left    ROTATOR CUFF REPAIR Right    TONSILLECTOMY     VASECTOMY      Current Outpatient Medications  Medication Sig Dispense Refill   Dermatological Products, Misc. (ELETONE) CREA Apply 1 Act topically 2 (two) times daily. 100 g 5   finasteride (PROPECIA) 1 MG tablet Take 1 tablet (1 mg total) by mouth daily. 90 tablet 1   sildenafil (REVATIO) 20 MG tablet TAKE 2 TO 5 TABLETS BY MOUTH DAILY AS NEEDED  3   albuterol (PROAIR HFA) 108 (90 Base) MCG/ACT inhaler Inhale 2 puffs into the lungs every 6 (six) hours as needed for wheezing or shortness of breath. 1 Inhaler 6   No current facility-administered medications for this visit.     Allergies as of 07/10/2018   (No Known Allergies)    Family History  Problem Relation Age of Onset   Cancer Father        Lymphoma   Depression Sister    Heart attack Brother    Healthy Brother    Healthy Brother    Cancer Maternal Grandfather        Paternal Grandfather   Prostate cancer Maternal Grandfather    Prostate cancer Paternal Grandfather     Social History   Socioeconomic History   Marital status: Married    Spouse name: Not on file   Number of children: 2   Years of education: master's   Highest education level: Not on file  Occupational History   Occupation: Chartered certified accountant   Social Needs   Financial resource strain: Not on file   Food insecurity:    Worry: Not on file    Inability: Not on file   Transportation needs:    Medical: Not on file    Non-medical: Not on  file  Tobacco Use   Smoking status: Never Smoker   Smokeless tobacco: Never Used  Substance and Sexual Activity   Alcohol use: Yes    Alcohol/week: 11.0 - 12.0 standard drinks    Types: 5 - 6 Standard drinks or equivalent, 6 Glasses of wine per week   Drug use: No   Sexual activity: Yes  Lifestyle   Physical activity:    Days per week: Not on file    Minutes per session: Not on file   Stress: Not on file  Relationships   Social connections:    Talks on phone: Not on file    Gets together: Not on file    Attends religious service: Not on file    Active member of club or organization: Not on file    Attends meetings of clubs or organizations: Not on file    Relationship status: Not on file   Intimate partner violence:    Fear of current or ex partner: Not on file    Emotionally abused: Not on file    Physically abused: Not on file    Forced sexual  activity: Not on file  Other Topics Concern   Not on file  Social History Narrative   Marital Status: Married Radiographer, therapeutic)   Children:  Daughter Apolonio Schneiders); Son Randall Hiss)   Pets:  None    Living Situation: Lives with spouse and children   Occupation: Doctor, 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     Drug Use:  None   Diet:  Regular   Exercise:  Cardio/Weights/Cutting Grass   Hobbies:  Golf, Music, Motorcycles      Patient drinks 2-3 cups of caffeine daily.   Patient is left handed.   Physical Exam: General: in no acute distress HEENT: No scleral icterus, no xanthaloma Neuro: Alert and appropriate Psych: Normal affect and normal insight Skin: No palmar erythema. No Terry's nail.  No spider angiomas on the face.   Sean Shevlin L. Tarri Glenn, MD, MPH [facility] 07/10/2018, 2:26 PM

## 2018-07-14 ENCOUNTER — Other Ambulatory Visit: Payer: 59

## 2018-07-14 ENCOUNTER — Telehealth: Payer: Self-pay | Admitting: *Deleted

## 2018-07-14 ENCOUNTER — Encounter: Payer: Self-pay | Admitting: Gastroenterology

## 2018-07-14 DIAGNOSIS — R748 Abnormal levels of other serum enzymes: Secondary | ICD-10-CM

## 2018-07-14 NOTE — Telephone Encounter (Signed)
Covid-19 travel screening questions  Have you traveled in the last 14 days? No If yes where?  Do you now or have you had a fever in the last 14 days? No  Do you have any respiratory symptoms of shortness of breath or cough now or in the last 14 days? No  Do you have any family members or close contacts with diagnosed or suspected Covid-19? No       

## 2018-07-15 ENCOUNTER — Ambulatory Visit (AMBULATORY_SURGERY_CENTER): Payer: 59 | Admitting: Gastroenterology

## 2018-07-15 ENCOUNTER — Encounter: Payer: Self-pay | Admitting: Gastroenterology

## 2018-07-15 ENCOUNTER — Other Ambulatory Visit: Payer: Self-pay

## 2018-07-15 VITALS — BP 110/80 | HR 66 | Temp 97.8°F | Resp 10 | Ht 71.0 in | Wt 190.0 lb

## 2018-07-15 DIAGNOSIS — D124 Benign neoplasm of descending colon: Secondary | ICD-10-CM

## 2018-07-15 DIAGNOSIS — D122 Benign neoplasm of ascending colon: Secondary | ICD-10-CM | POA: Diagnosis not present

## 2018-07-15 DIAGNOSIS — K625 Hemorrhage of anus and rectum: Secondary | ICD-10-CM

## 2018-07-15 DIAGNOSIS — Z86010 Personal history of colonic polyps: Secondary | ICD-10-CM

## 2018-07-15 DIAGNOSIS — D123 Benign neoplasm of transverse colon: Secondary | ICD-10-CM

## 2018-07-15 LAB — HM COLONOSCOPY

## 2018-07-15 MED ORDER — AMBULATORY NON FORMULARY MEDICATION: 1 refills | Status: DC

## 2018-07-15 MED ORDER — SODIUM CHLORIDE 0.9 % IV SOLN: 500.0000 mL | Freq: Once | INTRAVENOUS | Status: DC

## 2018-07-15 NOTE — Patient Instructions (Signed)
YOU HAD AN ENDOSCOPIC PROCEDURE TODAY AT [facility]:   Refer to the procedure report that was given to you for any specific questions about what was found during the examination.  If the procedure report does not answer your questions, please call your gastroenterologist to clarify.  If you requested that your care partner not be given the details of your procedure findings, then the procedure report has been included in a sealed envelope for you to review at your convenience later.  YOU SHOULD EXPECT: Some feelings of bloating in the abdomen. Passage of more gas than usual.  Walking can help get rid of the air that was put into your GI tract during the procedure and reduce the bloating. If you had a lower endoscopy (such as a colonoscopy or flexible sigmoidoscopy) you may notice spotting of blood in your stool or on the toilet paper. If you underwent a bowel prep for your procedure, you may not have a normal bowel movement for a few days.  Please Note:  You might notice some irritation and congestion in your nose or some drainage.  This is from the oxygen used during your procedure.  There is no need for concern and it should clear up in a day or so.  SYMPTOMS TO REPORT IMMEDIATELY:   Following lower endoscopy (colonoscopy or flexible sigmoidoscopy):  Excessive amounts of blood in the stool  Significant tenderness or worsening of abdominal pains  Swelling of the abdomen that is new, acute  Fever of 100F or higher  For urgent or emergent issues, a gastroenterologist can be reached at any hour by calling [phone].  DIET:  We do recommend a small meal at first, but then you may proceed to your regular diet.  Drink plenty of fluids but you should avoid alcoholic beverages for 24 hours.  ACTIVITY:  You should plan to take it easy for the rest of today and you should NOT DRIVE or use heavy machinery until tomorrow (because of the sedation medicines used during the test).     FOLLOW UP: Our staff will call the number listed on your records the next business day following your procedure to check on you and address any questions or concerns that you may have regarding the information given to you following your procedure. If we do not reach you, we will leave a message.  However, if you are feeling well and you are not experiencing any problems, there is no need to return our call.  We will assume that you have returned to your regular daily activities without incident. We will be calling you two weeks following your procedure to see if you have developed any symptoms of the COVID-19.  If you develop any symptoms before then, please let us know.  If any biopsies were taken you will be contacted by phone or by letter within the next 1-3 weeks.  Please call us at [phone] if you have not heard about the biopsies in 3 weeks.   SIGNATURES/CONFIDENTIALITY: You and/or your care partner have signed paperwork which will be entered into your electronic medical record.  These signatures attest to the fact that that the information above on your After Visit Summary has been reviewed and is understood.  Full responsibility of the confidentiality of this discharge information lies with you and/or your care-partner.   You should apply a pea size amount to your rectum three times daily. Please take your printed prescription to [facility].  [facility]'s information is below:  Address: [address] [place], [place] [address]  [phone]   Continue your normal medications  Please read over handout about polyps  Add a daily stool bulking agent such as psyllium (over the counter) to keep your stools soft.  Avoid straining

## 2018-07-15 NOTE — Progress Notes (Signed)
Called to room to assist during endoscopic procedure.  Patient ID and intended procedure confirmed with present staff. Received instructions for my participation in the procedure from the performing physician.  

## 2018-07-15 NOTE — Progress Notes (Signed)
PT taken to PACU. Monitors in place. VSS. Report given to RN. 

## 2018-07-15 NOTE — Op Note (Signed)
[facility] Patient Name: Sean Cochran Procedure Date: 07/15/2018 10:15 AM MRN: [id number] Endoscopist: Thornton Park MD, MD Age: 58 Referring MD:  Date of Birth: 05/28/1960 Gender: Male Account #: [id number] Procedure:                Colonoscopy Indications:              Surveillance: Personal history of adenomatous                            polyps on last colonoscopy > 5 years ago Medicines:                See the Anesthesia note for documentation of the                            administered medications Procedure:                Pre-Anesthesia Assessment:                           - Prior to the procedure, a History and Physical                            was performed, and patient medications and                            allergies were reviewed. The patient's tolerance of                            previous anesthesia was also reviewed. The risks                            and benefits of the procedure and the sedation                            options and risks were discussed with the patient.                            All questions were answered, and informed consent                            was obtained. Prior Anticoagulants: The patient has                            taken no previous anticoagulant or antiplatelet                            agents. ASA Grade Assessment: II - A patient with                            mild systemic disease. After reviewing the risks                            and benefits, the patient was deemed in  satisfactory condition to undergo the procedure.                           After obtaining informed consent, the colonoscope                            was passed under direct vision. Throughout the                            procedure, the patient's blood pressure, pulse, and                            oxygen saturations were monitored continuously. The                            Colonoscope was  introduced through the anus and                            advanced to the the terminal ileum, with                            identification of the appendiceal orifice and IC                            valve. The colonoscopy was performed without                            difficulty. The patient tolerated the procedure                            well. The quality of the bowel preparation was                            good. The terminal ileum, ileocecal valve,                            appendiceal orifice, and rectum were photographed. Scope In: 10:23:34 AM Scope Out: 10:38:58 AM Scope Withdrawal Time: 0 hours 9 minutes 49 seconds  Total Procedure Duration: 0 hours 15 minutes 24 seconds  Findings:                 A small, posterior anal fissure was found on                            perianal exam.                           Two sessile polyps were found in the descending                            colon. The polyps were 2 to 3 mm in size. These                            polyps were removed with a cold snare. Resection  and retrieval were complete. Estimated blood loss                            was minimal.                           A 1 mm polyp was found in the ascending colon. The                            polyp was sessile. The polyp was removed with a                            cold biopsy forceps. Resection and retrieval were                            complete. Estimated blood loss: none.                           The exam was otherwise without abnormality on                            direct and retroflexion views. Complications:            No immediate complications. Estimated blood loss:                            Minimal. Estimated Blood Loss:     Estimated blood loss was minimal. Impression:               - Anal fissure found on perianal exam.                           - Two 3 to 4 mm polyps in the descending colon,                             removed with a cold snare. Resected and retrieved.                           - One 1 mm polyp in the ascending colon, removed                            with a cold biopsy forceps. Resected and retrieved.                           - The examination was otherwise normal on direct                            and retroflexion views. Recommendation:           - Patient has a contact number available for                            emergencies. The signs and symptoms of potential  delayed complications were discussed with the                            patient. Return to normal activities tomorrow.                            Written discharge instructions were provided to the                            patient.                           - Resume regular diet today.                           - Add a daily stool bulking agent such as psyllium                            to keep your stools soft. Avoid straining.                           - Continue present medications. Add Nitroglycerin                            0.125% applied to the rectum TID x 8 weeks.                           - Await pathology results. If at least 3 polyps are                            adenomas, will repeat colonoscopy in 3 years. If 1                            or 2 polyps are adenomatous, will repeat                            colonoscopy in 7 years. Thornton Park MD, MD 07/15/2018 10:50:22 AM This report has been signed electronically.

## 2018-07-17 ENCOUNTER — Ambulatory Visit: Payer: 59

## 2018-07-17 ENCOUNTER — Telehealth: Payer: Self-pay

## 2018-07-17 ENCOUNTER — Telehealth: Payer: Self-pay | Admitting: *Deleted

## 2018-07-17 LAB — NASH FIBROSURE
ALPHA 2-MACROGLOBULINS, QN: 130 mg/dL (ref 110–276)
ALT (SGPT) P5P: 66 IU/L — ABNORMAL HIGH (ref 0–55)
AST (SGOT) P5P: 40 IU/L (ref 0–40)
Apolipoprotein A-1: 135 mg/dL (ref 101–178)
Bilirubin, Total: 0.3 mg/dL (ref 0.0–1.2)
Cholesterol, Total: 264 mg/dL — ABNORMAL HIGH (ref 100–199)
Fibrosis Score: 0.35 — ABNORMAL HIGH (ref 0.00–0.21)
GGT: 324 IU/L — ABNORMAL HIGH (ref 0–65)
Glucose: 123 mg/dL — ABNORMAL HIGH (ref 65–99)
Haptoglobin: 57 mg/dL (ref 29–370)
Height: 71 in
NASH Score: 0.25
Steatosis Score: 0.84 — ABNORMAL HIGH (ref 0.00–0.30)
Triglycerides: 105 mg/dL (ref 0–149)
Weight: 190 [lb_av]

## 2018-07-17 NOTE — Telephone Encounter (Signed)
  Follow up Call-  Call back number 07/15/2018  Post procedure Call Back phone  # [phone]  Permission to leave phone message Yes  Some recent data might be hidden     Patient questions:  Do you have a fever, pain , or abdominal swelling? No. Pain Score  0   Have you tolerated food without any problems? Yes.    Have you been able to return to your normal activities? Yes.    Do you have any questions about your discharge instructions: Diet   No. Medications  No. Follow up visit  No.  Do you have questions or concerns about your Care? No.  Actions: * If pain score is 4 or above: No action needed, pain <4.  1. Have you developed a fever since your procedure? no  2.   Have you had an respiratory symptoms (SOB or cough) since your procedure? no  3.   Have you tested positive for COVID 19 since your procedure no  3.   Have you had any family members/close contacts diagnosed with the COVID 19 since your procedure?  No  Spoke with wife- she states he has done well- no issues - he is out on his motorcycle and gone for the day with no problems-    If any of these questions are a yes, please inquire if patient has been seen by family doctor and route this note to Joylene John, Therapist, sports.

## 2018-07-17 NOTE — Telephone Encounter (Signed)
Left voicemail asking patient to call back to be transferred to [place] office, needs to talk with Kapil Petropoulos, to arrange vaccine admin in parking lot for nurse visit scheduled on 5/18 Monday

## 2018-07-21 ENCOUNTER — Ambulatory Visit (INDEPENDENT_AMBULATORY_CARE_PROVIDER_SITE_OTHER): Payer: 59

## 2018-07-21 DIAGNOSIS — Z23 Encounter for immunization: Secondary | ICD-10-CM | POA: Diagnosis not present

## 2018-07-21 DIAGNOSIS — Z299 Encounter for prophylactic measures, unspecified: Secondary | ICD-10-CM

## 2018-07-22 ENCOUNTER — Encounter: Payer: Self-pay | Admitting: Gastroenterology

## 2018-07-25 NOTE — Progress Notes (Signed)
TELEHEALTH VISIT  Referring Provider: Janith Lima, MD Primary Care Physician:  Janith Lima, MD   Tele-visit due to COVID-19 pandemic Patient requested visit virtually, consented to the virtual encounter via video enabled telemedicine application (Zoom) Contact made at: 15:00 07/29/18 Patient verified by name and date of birth Location of patient: Home Location provider: [place] [facility] Names of persons participating: Me, patient, Sean Cochran (wife), Tinnie Gens CMA Time spent on telehealth visit: 38 minutes I discussed the limitations of evaluation and management by telemedicine. The patient expressed understanding and agreed to proceed.  Reason for Consultation:  Abnormal liver enzymes   IMPRESSION:  Suspected fatty liver based on FibroSURE test    - Elevated ALT 45-71, elevated GGTP    - F1-F2 fibrosis    - S3 steatosis (marked or severe) without significant inflammation Intermittent rectal bleeding due to an anal fissure ANA + 1:40 in a speckled pattern    - Smooth muscle Ab negative    - LKM antibody negative BMI 27 Hypercholesterolemia History of colon polyp on colonoscopy in 2014    - two small tubular adenoma removed on colonoscopy 07/15/18    - surveillance recommended in 7 years Family history of celiac disease (niece and nephew)  Suspected fatty liver. Reviewed the natural history and treatment options. He recently read the book Skinny Liver to better understand his fatty liver. Treatment is currently focused on working towards a healthy weight, regular exercise, and maximizing control of diabetes and cholesterol abnormalities. I have advised that he avoid alcohol, get plenty of sleep, and consider drinking 2-3 cups of regular coffee every day. Hopefully, medical therapy will be available in the future.   He may resume finasteride, ibuprofen, and Sudafed. Management of his cholesterol is a high priority in the treatment of fatty liver. Statins may be  safely used as long as he continues to be monitored.   PLAN: Follow-up with Dr. 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 The interval history is obtained through the patient and review of his electronic health record. His wife is on the telehealth encounter today.  I have reviewed his recent FibroSURE test. There are some limitations to a blood test looking at liver inflammation and damage, however, this test was used in an effort to avoid a liver biopsy. The test suggests that there is a little bit of liver damage (called fibrosis and defined as F1-F2) and a significant amount of fat in the liver (called steatosis in the report). But, there is no significant inflammation related to the fat. There is no cirrhosis.    He has no new complaints or concerns. He remains symptom free.  He continues to drink alcohol.  He had a colonoscopy 07/15/18 that revealed two small tubular adenomas and an anal fissure.     Past Medical History:  Diagnosis Date  . Abnormal finding on MRI of brain 10/04/2014  . Back pain   . Colon polyp 02/19/2013   Dr. Collene Mares - Repeat in 5 years  . Constipation   . Hip pain   . History of pineal cyst   . Hyperlipidemia   . Memory loss     Past Surgical History:  Procedure Laterality Date  . BICEPS TENDON REPAIR Left   . COLONOSCOPY    .  ROTATOR CUFF REPAIR Right   . TONSILLECTOMY    . VASECTOMY      Current Outpatient Medications  Medication Sig Dispense Refill  . albuterol (PROAIR HFA) 108 (90 Base) MCG/ACT inhaler Inhale 2 puffs into the lungs every 6 (six) hours as needed for wheezing or shortness of breath. 1 Inhaler 6  . AMBULATORY NON FORMULARY MEDICATION Medication Name: Nitroglycerin 0.125% ointment applied to rectum 3 times daily  for 8 weeks. 30 g 1  . Dermatological Products, Misc. (ELETONE) CREA Apply 1 Act topically 2 (two) times daily. 100 g 5  . finasteride (PROPECIA) 1 MG tablet Take 1 tablet (1 mg total) by mouth daily. (Patient not taking: Reported on 07/15/2018) 90 tablet 1  . sildenafil (REVATIO) 20 MG tablet TAKE 2 TO 5 TABLETS BY MOUTH DAILY AS NEEDED  3   No current facility-administered medications for this visit.     Allergies as of 07/29/2018  . (No Known Allergies)    Family History  Problem Relation Age of Onset  . Cancer Father        Lymphoma  . Depression Sister   . Heart attack Brother   . Healthy Brother   . Healthy Brother   . Cancer Maternal Grandfather        Paternal Grandfather  . Prostate cancer Maternal Grandfather   . Prostate cancer Paternal Grandfather   . Colon cancer Neg Hx   . Colon polyps Neg Hx   . Esophageal cancer Neg Hx   . Rectal cancer Neg Hx   . Stomach cancer Neg Hx     Social History   Socioeconomic History  . Marital status: Married    Spouse name: Not on file  . Number of children: 2  . Years of education: master's  . Highest education level: Not on file  Occupational History  . Occupation: Chartered certified accountant   Social Needs  . Financial resource strain: Not on file  . Food insecurity:    Worry: Not on file    Inability: Not on file  . Transportation needs:    Medical: Not on file    Non-medical: Not on file  Tobacco Use  . Smoking status: Never Smoker  . Smokeless tobacco: Never Used  Substance and Sexual Activity  . Alcohol use: Yes    Alcohol/week: 11.0 - 12.0 standard drinks    Types: 5 - 6 Standard drinks or equivalent, 6 Glasses of wine per week  . Drug use: No  . Sexual activity: Yes  Lifestyle  . Physical activity:    Days per week: Not on file    Minutes per session: Not on file  . Stress: Not on file  Relationships  . Social connections:    Talks on phone: Not on file    Gets together: Not on file    Attends religious  service: Not on file    Active member of club or organization: Not on file    Attends meetings of clubs or organizations: Not on file    Relationship status: Not on file  . Intimate partner violence:    Fear of current or ex partner: Not on file    Emotionally abused: Not on file    Physically abused: Not on file    Forced sexual activity: Not on file  Other Topics Concern  . 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     Drug Use:  None   Diet:  Regular   Exercise:  Cardio/Weights/Cutting Grass   Hobbies:  Golf, Music, Motorcycles      Patient drinks 2-3 cups of caffeine daily.   Patient is left handed.   Physical Exam: General: in no acute distress HEENT: No scleral icterus, no xanthaloma Neuro: Alert and appropriate Psych: Normal affect and normal insight Skin: No palmar erythema. No Terry's nail.  No spider angiomas on the face.   Malaika Arnall L. Tarri Glenn, MD, MPH [facility] 07/25/2018, 3:13 PM

## 2018-07-29 ENCOUNTER — Encounter: Payer: Self-pay | Admitting: Gastroenterology

## 2018-07-29 ENCOUNTER — Ambulatory Visit (INDEPENDENT_AMBULATORY_CARE_PROVIDER_SITE_OTHER): Payer: 59 | Admitting: Gastroenterology

## 2018-07-29 ENCOUNTER — Other Ambulatory Visit: Payer: Self-pay

## 2018-07-29 VITALS — Ht 71.0 in | Wt 190.0 lb

## 2018-07-29 DIAGNOSIS — K760 Fatty (change of) liver, not elsewhere classified: Secondary | ICD-10-CM | POA: Diagnosis not present

## 2018-07-29 DIAGNOSIS — E785 Hyperlipidemia, unspecified: Secondary | ICD-10-CM

## 2018-07-29 DIAGNOSIS — R748 Abnormal levels of other serum enzymes: Secondary | ICD-10-CM

## 2018-07-29 NOTE — Patient Instructions (Addendum)
The most significant recommendation from the [facility] is that you should avoid alcohol as this may lead to further, and potentially clinically significant, liver damage.   I recommend that you work to maintain a health weight, control any cholesterol/lipid problems, and continue with close monitoring of your blood sugars.   Exercise has been shown to improve fat in the liver. And, there is the suggestion that coffee may be protective.   Consider drinking 2-3 9 ounce cups of regular brewed coffee every day. Although limit your daily caffeine intake to no more than 400 mg.   Improve sleep hygiene to match sleep and wake times during workdays and weekends.  Sleep at least 7-9 hours every night. Use blackout curtains, turn off lights at bedtime, and limit nighttime use of electronic devices and caffeine.   You may resume your finasteride, ibuprofen, and Sudafed. Please talk to Dr. Ronnald Ramp after treatment for your cholesterol.   I have recommended that we repeat the FibroSURE test in one year. There is no reason to repeat it earlier than that. Please call me with any questions or concerns in the meantime.

## 2018-08-01 ENCOUNTER — Ambulatory Visit: Payer: 59 | Admitting: Gastroenterology

## 2018-08-06 ENCOUNTER — Encounter: Payer: Self-pay | Admitting: Internal Medicine

## 2018-08-15 ENCOUNTER — Encounter: Payer: Self-pay | Admitting: Internal Medicine

## 2018-09-03 ENCOUNTER — Encounter: Payer: Self-pay | Admitting: Internal Medicine

## 2018-09-09 ENCOUNTER — Encounter: Payer: Self-pay | Admitting: Internal Medicine

## 2018-09-09 ENCOUNTER — Other Ambulatory Visit: Payer: Self-pay | Admitting: Internal Medicine

## 2018-09-09 DIAGNOSIS — K7581 Nonalcoholic steatohepatitis (NASH): Secondary | ICD-10-CM

## 2018-09-09 DIAGNOSIS — Z0000 Encounter for general adult medical examination without abnormal findings: Secondary | ICD-10-CM

## 2018-09-10 ENCOUNTER — Encounter: Payer: 59 | Admitting: Internal Medicine

## 2018-09-11 ENCOUNTER — Other Ambulatory Visit (INDEPENDENT_AMBULATORY_CARE_PROVIDER_SITE_OTHER): Payer: 59

## 2018-09-11 DIAGNOSIS — K7581 Nonalcoholic steatohepatitis (NASH): Secondary | ICD-10-CM

## 2018-09-11 DIAGNOSIS — Z0000 Encounter for general adult medical examination without abnormal findings: Secondary | ICD-10-CM | POA: Diagnosis not present

## 2018-09-11 LAB — HEPATIC FUNCTION PANEL
ALT: 30 U/L (ref 0–53)
AST: 20 U/L (ref 0–37)
Albumin: 4.6 g/dL (ref 3.5–5.2)
Alkaline Phosphatase: 76 U/L (ref 39–117)
Bilirubin, Direct: 0.1 mg/dL (ref 0.0–0.3)
Total Bilirubin: 0.8 mg/dL (ref 0.2–1.2)
Total Protein: 6.5 g/dL (ref 6.0–8.3)

## 2018-09-11 LAB — PSA: PSA: 0.28 ng/mL (ref 0.10–4.00)

## 2018-09-11 LAB — LIPID PANEL
Cholesterol: 218 mg/dL — ABNORMAL HIGH (ref 0–200)
HDL: 46.4 mg/dL (ref >39.00)
LDL Cholesterol: 151 mg/dL — ABNORMAL HIGH (ref 0–99)
NonHDL: 171.35
Total CHOL/HDL Ratio: 5
Triglycerides: 102 mg/dL (ref 0.0–149.0)
VLDL: 20.4 mg/dL (ref 0.0–40.0)

## 2018-09-15 ENCOUNTER — Encounter: Payer: Self-pay | Admitting: Internal Medicine

## 2018-09-15 ENCOUNTER — Other Ambulatory Visit: Payer: Self-pay

## 2018-09-15 ENCOUNTER — Ambulatory Visit (INDEPENDENT_AMBULATORY_CARE_PROVIDER_SITE_OTHER): Payer: 59 | Admitting: Internal Medicine

## 2018-09-15 VITALS — BP 106/70 | HR 65 | Temp 98.2°F | Resp 16 | Ht 71.0 in | Wt 182.3 lb

## 2018-09-15 DIAGNOSIS — Z0000 Encounter for general adult medical examination without abnormal findings: Secondary | ICD-10-CM | POA: Diagnosis not present

## 2018-09-15 DIAGNOSIS — K7581 Nonalcoholic steatohepatitis (NASH): Secondary | ICD-10-CM

## 2018-09-15 DIAGNOSIS — E785 Hyperlipidemia, unspecified: Secondary | ICD-10-CM

## 2018-09-15 NOTE — Progress Notes (Signed)
Subjective:  Patient ID: Sean Cochran, male    DOB: 01-Jan-1961  Age: 58 y.o. MRN: [id number]  CC: Annual Exam   HPI [name] presents for a CPX.  He feels well today and offers no complaints.  Over the last few months he has been diagnosed with NASH.  He has improved his lifestyle modifications with weight loss and he only drinks 1 glass of red wine a couple times a week.  He is not willing to take pioglitazone to mitigate the complications of [name].  He exercises frequently and has good endurance with exertion.  He denies any recent episodes of CP, DOE, palpitations, edema, or fatigue.  Outpatient Medications Prior to Visit  Medication Sig Dispense Refill  . finasteride (PROPECIA) 1 MG tablet Take 1 mg by mouth daily.    Marland Kitchen albuterol (PROAIR HFA) 108 (90 Base) MCG/ACT inhaler Inhale 2 puffs into the lungs every 6 (six) hours as needed for wheezing or shortness of breath. 1 Inhaler 6  . AMBULATORY NON FORMULARY MEDICATION Medication Name: Nitroglycerin 0.125% ointment applied to rectum 3 times daily for 8 weeks. 30 g 1  . Dermatological Products, Misc. (ELETONE) CREA Apply 1 Act topically 2 (two) times daily. 100 g 5  . sildenafil (REVATIO) 20 MG tablet TAKE 2 TO 5 TABLETS BY MOUTH DAILY AS NEEDED  3   No facility-administered medications prior to visit.     ROS Review of Systems  Constitutional: Negative.  Negative for diaphoresis, fatigue and unexpected weight change.  HENT: Negative.   Eyes: Negative for visual disturbance.  Respiratory: Negative for cough, chest tightness and shortness of breath.   Cardiovascular: Negative for chest pain, palpitations and leg swelling.  Gastrointestinal: Negative for abdominal pain, constipation, diarrhea, nausea and vomiting.  Endocrine: Negative.   Genitourinary: Negative.  Negative for difficulty urinating, penile swelling, scrotal swelling and testicular pain.  Musculoskeletal: Negative.  Negative for arthralgias and myalgias.   Skin: Negative.  Negative for color change.  Neurological: Negative.  Negative for dizziness, weakness and headaches.  Hematological: Negative for adenopathy. Does not bruise/bleed easily.  Psychiatric/Behavioral: Negative.     Objective:  BP 106/70 (BP Location: Left Arm, Patient Position: Sitting, Cuff Size: Normal)   Pulse 65   Temp 98.2 F (36.8 C) (Oral)   Resp 16   Ht 5' 11"  (1.803 m)   Wt 182 lb 5 oz (82.7 kg)   SpO2 99%   BMI 25.43 kg/m   BP Readings from Last 3 Encounters:  09/15/18 106/70  07/15/18 110/80  03/26/17 110/70    Wt Readings from Last 3 Encounters:  09/15/18 182 lb 5 oz (82.7 kg)  07/29/18 190 lb (86.2 kg)  07/15/18 190 lb (86.2 kg)    Physical Exam Vitals signs reviewed.  Constitutional:      Appearance: He is not ill-appearing or diaphoretic.  HENT:     Nose: Nose normal.     Mouth/Throat:     Mouth: Mucous membranes are moist.     Pharynx: No oropharyngeal exudate.  Eyes:     General: No scleral icterus.    Conjunctiva/sclera: Conjunctivae normal.  Neck:     Musculoskeletal: Normal range of motion. No neck rigidity or muscular tenderness.  Cardiovascular:     Rate and Rhythm: Normal rate and regular rhythm.     Heart sounds: No murmur.  Pulmonary:     Effort: Pulmonary effort is normal.     Breath sounds: No stridor. No wheezing, rhonchi or rales.  Abdominal:     General: Abdomen is flat. Bowel sounds are normal. There is no distension.     Palpations: Abdomen is soft. There is no hepatomegaly, splenomegaly or mass.     Tenderness: There is no abdominal tenderness.     Hernia: No hernia is present. There is no hernia in the left inguinal area or right inguinal area.  Genitourinary:    Pubic Area: No rash.      Penis: Normal. No discharge, swelling or lesions.      Scrotum/Testes: Normal.        Right: Mass, tenderness or swelling not present.        Left: Mass, tenderness or swelling not present.     Epididymis:     Right:  Normal. Not inflamed or enlarged. No mass or tenderness.     Left: Normal. Not inflamed or enlarged. No mass or tenderness.     Prostate: Normal. Not enlarged, not tender and no nodules present.     Rectum: Normal. Guaiac result negative. No mass, tenderness, anal fissure, external hemorrhoid or internal hemorrhoid. Normal anal tone.  Musculoskeletal: Normal range of motion.     Right lower leg: No edema.     Left lower leg: No edema.  Lymphadenopathy:     Cervical: No cervical adenopathy.     Lower Body: No right inguinal adenopathy. No left inguinal adenopathy.  Skin:    General: Skin is warm and dry.  Neurological:     General: No focal deficit present.     Mental Status: He is alert and oriented to person, place, and time.  Psychiatric:        Mood and Affect: Mood normal.        Behavior: Behavior normal.     Lab Results  Component Value Date   WBC 4.2 06/12/2018   HGB 15.6 06/12/2018   HCT 44.6 06/12/2018   PLT 197.0 06/12/2018   GLUCOSE 102 (H) 06/20/2018   CHOL 218 (H) 09/11/2018   TRIG 102.0 09/11/2018   HDL 46.40 09/11/2018   LDLCALC 151 (H) 09/11/2018   ALT 30 09/11/2018   AST 20 09/11/2018   NA 139 06/12/2018   K 4.0 06/12/2018   CL 102 06/12/2018   CREATININE 0.99 06/12/2018   BUN 17 06/12/2018   CO2 29 06/12/2018   TSH 3.72 06/12/2018   PSA 0.28 09/11/2018   INR 1.1 (H) 06/12/2018    US Abdomen Limited Ruq  Result Date: 07/02/2018 CLINICAL DATA:  Elevated liver enzymes EXAM: ULTRASOUND ABDOMEN LIMITED RIGHT UPPER QUADRANT COMPARISON:  None. FINDINGS: Gallbladder: No gallstones or wall thickening visualized. There is no pericholecystic fluid. No sonographic Murphy sign noted by sonographer. Common bile duct: Diameter: 3 mm. No intrahepatic or extrahepatic biliary duct dilatation. Liver: No focal lesion identified. Within normal limits in parenchymal echogenicity. Portal vein is patent on color Doppler imaging with normal direction of blood flow towards the  liver. IMPRESSION: Study within normal limits. Electronically Signed   By: Lowella Grip III M.D.   On: 07/02/2018 08:31    Assessment & Plan:   [name] was seen today for annual exam.  Diagnoses and all orders for this visit:  Routine general medical examination at a health care facility- Exam completed, labs reviewed, vaccines reviewed, colon cancer screening is up-to-date, patient education material was given.  Hyperlipidemia LDL goal <160- His ASCVD risk score is around 5% so I recommended that he not take a statin for CV risk reduction.  Nonalcoholic steatohepatitis (  NASH)- Marked improvement noted with lifestyle modification and decreased alcohol intake.  As mentioned previously, he is not willing to take pioglitazone.   I have discontinued [name] L. Grace's sildenafil, Eletone, albuterol, and AMBULATORY NON FORMULARY MEDICATION. I am also having him maintain his finasteride.  No orders of the defined types were placed in this encounter.    Follow-up: Return in about 1 year (around 09/15/2019).  Scarlette Calico, MD

## 2018-09-15 NOTE — Patient Instructions (Signed)

## 2018-10-07 ENCOUNTER — Other Ambulatory Visit: Payer: Self-pay

## 2018-10-07 DIAGNOSIS — R6889 Other general symptoms and signs: Secondary | ICD-10-CM | POA: Diagnosis not present

## 2018-10-07 DIAGNOSIS — Z20822 Contact with and (suspected) exposure to covid-19: Secondary | ICD-10-CM

## 2018-10-08 LAB — NOVEL CORONAVIRUS, NAA: SARS-CoV-2, NAA: NOT DETECTED

## 2018-10-21 ENCOUNTER — Encounter: Payer: Self-pay | Admitting: Internal Medicine

## 2018-11-18 DIAGNOSIS — N401 Enlarged prostate with lower urinary tract symptoms: Secondary | ICD-10-CM | POA: Diagnosis not present

## 2018-11-18 DIAGNOSIS — R351 Nocturia: Secondary | ICD-10-CM | POA: Diagnosis not present

## 2018-11-25 DIAGNOSIS — Z125 Encounter for screening for malignant neoplasm of prostate: Secondary | ICD-10-CM | POA: Diagnosis not present

## 2018-11-25 DIAGNOSIS — N5201 Erectile dysfunction due to arterial insufficiency: Secondary | ICD-10-CM | POA: Diagnosis not present

## 2018-11-25 DIAGNOSIS — Z8042 Family history of malignant neoplasm of prostate: Secondary | ICD-10-CM | POA: Diagnosis not present

## 2019-01-16 ENCOUNTER — Other Ambulatory Visit: Payer: Self-pay

## 2019-01-16 ENCOUNTER — Ambulatory Visit (INDEPENDENT_AMBULATORY_CARE_PROVIDER_SITE_OTHER): Payer: 59

## 2019-01-16 DIAGNOSIS — Z299 Encounter for prophylactic measures, unspecified: Secondary | ICD-10-CM

## 2019-01-16 DIAGNOSIS — Z23 Encounter for immunization: Secondary | ICD-10-CM | POA: Diagnosis not present

## 2019-03-05 DIAGNOSIS — H5213 Myopia, bilateral: Secondary | ICD-10-CM | POA: Diagnosis not present

## 2019-05-04 ENCOUNTER — Ambulatory Visit: Payer: 59 | Attending: Internal Medicine

## 2019-05-04 DIAGNOSIS — Z23 Encounter for immunization: Secondary | ICD-10-CM | POA: Insufficient documentation

## 2019-05-04 NOTE — Progress Notes (Signed)
   Covid-19 Vaccination Clinic  Name:  TAYTE MCWHERTER    MRN: [id number] DOB: May 16, 1960  05/04/2019  Mr. Castro was observed post Covid-19 immunization for 15 minutes without incidence. He was provided with Vaccine Information Sheet and instruction to access the V-Safe system.   Mr. Haste was instructed to call 911 with any severe reactions post vaccine: Marland Kitchen Difficulty breathing  . Swelling of your face and throat  . A fast heartbeat  . A bad rash all over your body  . Dizziness and weakness    Immunizations Administered    Name Date Dose VIS Date Route   Pfizer COVID-19 Vaccine 05/04/2019  3:03 PM 0.3 mL 02/13/2019 Intramuscular   Manufacturer: [place]   Lot: [id number]   [place]: [id number]

## 2019-06-02 ENCOUNTER — Ambulatory Visit: Payer: 59 | Attending: Internal Medicine

## 2019-06-02 DIAGNOSIS — Z23 Encounter for immunization: Secondary | ICD-10-CM

## 2019-06-02 NOTE — Progress Notes (Signed)
   Covid-19 Vaccination Clinic  Name:  Sean Cochran    MRN: [id number] DOB: September 09, 1960  06/02/2019  [name] was observed post Covid-19 immunization for 15 minutes without incident. He was provided with Vaccine Information Sheet and instruction to access the V-Safe system.   [name] was instructed to call 911 with any severe reactions post vaccine: Marland Kitchen Difficulty breathing  . Swelling of face and throat  . A fast heartbeat  . A bad rash all over body  . Dizziness and weakness   Immunizations Administered    Name Date Dose VIS Date Route   Pfizer COVID-19 Vaccine 06/02/2019  2:06 PM 0.3 mL 02/13/2019 Intramuscular   Manufacturer: [place]   Lot: [id number]   [place]: [address]-9

## 2019-06-10 DIAGNOSIS — D485 Neoplasm of uncertain behavior of skin: Secondary | ICD-10-CM | POA: Diagnosis not present

## 2019-07-06 DIAGNOSIS — D485 Neoplasm of uncertain behavior of skin: Secondary | ICD-10-CM | POA: Diagnosis not present

## 2019-07-06 DIAGNOSIS — L821 Other seborrheic keratosis: Secondary | ICD-10-CM | POA: Diagnosis not present

## 2019-11-24 ENCOUNTER — Other Ambulatory Visit: Payer: Self-pay

## 2019-11-24 ENCOUNTER — Ambulatory Visit (INDEPENDENT_AMBULATORY_CARE_PROVIDER_SITE_OTHER): Payer: 59 | Admitting: Sports Medicine

## 2019-11-24 VITALS — BP 108/70 | Ht 71.0 in | Wt 170.0 lb

## 2019-11-24 DIAGNOSIS — M7661 Achilles tendinitis, right leg: Secondary | ICD-10-CM | POA: Diagnosis not present

## 2019-11-24 DIAGNOSIS — M7671 Peroneal tendinitis, right leg: Secondary | ICD-10-CM | POA: Diagnosis not present

## 2019-11-24 NOTE — Progress Notes (Signed)
Office Visit Note   Patient: Sean Cochran           Date of Birth: December 16, 1960           MRN: [id number] Visit Date: 11/24/2019 Requested by: Janith Lima, MD [place],  [place] [id number] PCP: Janith Lima, MD  Subjective: CC: Right Ankle pain  HPI: 59 year old male presenting to clinic today with concerns of right ankle pain, which is been ongoing for several months.  Patient states that his pain started insidiously, focused over the lateral aspect of his right ankle as well as in the posterior aspect of his lower calf.  He states that he has a history of a calf strain, for which she was seen several years ago and given bilateral heel lifts.  He has been compliant with these heel lifts, and states that they offer significant improvement to his calf pain until this recent episode.  Patient is an avid Firefighter, and states that his ankle pain is most significantly troublesome during tennis or when going up and down stairs.  Pain is located on the lateral aspect of his ankle as well as in his fifth metatarsal, additionally he has pain along the posterior aspect of his ankle approximately 6 to 7 cm from the Achilles insertion.  He cannot recall any new activities or trauma.  He states that overall he just "feels very tight" in this area.  He has been trying to stretch with minimal relief.  He was hoping this pain would resolve on its own, however finally decided to make this appointment after several months had passed without improvement.              ROS:   All other systems were reviewed and are negative.  Objective: Vital Signs: BP 108/70   Ht 5' 11"  (1.803 m)   Wt 170 lb (77.1 kg)   BMI 23.71 kg/m    [facility] 11/24/2019  Frequency of aerobic exercise (# of days/week) 3  Average time in minutes 90  Frequency of strengthening activities (# of days/week) 2    Physical Exam:  General:  Alert and oriented, in no acute  distress. Pulm:  Breathing unlabored. Psy:  Normal mood, congruent affect. Skin: Right foot/ankle with skin intact, no bruising, no rashes, no other skin lesions.  RIGHT ANKLE EXAM General assessment: Normal Gait.  Inspection: No significant pes planus or cavus. Normal posterior tibialis function with feel raise.   No significant swelling or deformity.  Seated Exam: Ankle with full range of motion throughout all fields. Palpation: Endorses pain approximately 7 centimeters proximal to Achilles insertion site, at approximately the level of the musculotendinous junction of the Achilles tendon.  Additionally, with mild tenderness over the peroneal tendons-most significantly over brevis tendon at insertion point at base of fifth metatarsal.   No bony tenderness to palpation along the shaft of the 5th metatarsal, or medially at the navicular. Ligamentous Examination:  No Tenderness or ecchymosis over the ATFL, CFL, or PTFL No tenderness over the medial deltoid ligament complex Anterior drawer without anterior slide Talar Tilt appropriate No Tenderness over the Anterior joint line No pain with external rotation ('High ankle') testing.   Strength: 5/5 in eversion, inversion, and plantar/dorsiflexion Normal distal sensation  Imaging: Limited extremity ultrasound- Right ankle:  Peroneal tendons visualized in long and short axis, with surrounding fluid most significantly appreciated around the peroneus longus.  Peroneal insertion site on the base of  the fifth metatarsal with no significant spurring or degenerative changes. Right Achilles with no significant surrounding effusion, though does have some irregularities appreciated within the tendinous structure at the musculotendinous junction.   Assessment & Plan: 59 year old male presenting to clinic with concerns of chronic, atraumatic right ankle pain primarily in the lateral aspect of his foot, as well as in his proximal Achilles tendon.   Ultrasound with mild fluid surrounding peroneal tendons suggesting of peroneal tendinitis.  Additionally with some tendon irregularities of the musculotendinous junction on Achilles, suggesting Achilles tendinitis. -Has heel lifts from previous episode of calf pain, and was provided with a new pair today. -We will send to physical therapy for exercises  he can perform to help improve his symptoms. -Provided with body helix compression sleeve for ankle. -Return to clinic in 4 to 6 weeks if symptoms have not improved, or sooner if they worsen. -Patient with no further questions today.   Patient seen and evaluated with the sports medicine fellow.  I agree with the above plan of care.  Patient return to the office in 4 to 6 weeks for reevaluation.  If symptoms persist consider x-rays and formal physical therapy.

## 2019-11-27 DIAGNOSIS — M25571 Pain in right ankle and joints of right foot: Secondary | ICD-10-CM | POA: Diagnosis not present

## 2019-11-27 DIAGNOSIS — R269 Unspecified abnormalities of gait and mobility: Secondary | ICD-10-CM | POA: Diagnosis not present

## 2019-12-07 DIAGNOSIS — M25571 Pain in right ankle and joints of right foot: Secondary | ICD-10-CM | POA: Diagnosis not present

## 2019-12-07 DIAGNOSIS — R269 Unspecified abnormalities of gait and mobility: Secondary | ICD-10-CM | POA: Diagnosis not present

## 2019-12-11 DIAGNOSIS — M25571 Pain in right ankle and joints of right foot: Secondary | ICD-10-CM | POA: Diagnosis not present

## 2019-12-11 DIAGNOSIS — R269 Unspecified abnormalities of gait and mobility: Secondary | ICD-10-CM | POA: Diagnosis not present

## 2019-12-16 DIAGNOSIS — R269 Unspecified abnormalities of gait and mobility: Secondary | ICD-10-CM | POA: Diagnosis not present

## 2019-12-16 DIAGNOSIS — M25571 Pain in right ankle and joints of right foot: Secondary | ICD-10-CM | POA: Diagnosis not present

## 2019-12-18 DIAGNOSIS — M25571 Pain in right ankle and joints of right foot: Secondary | ICD-10-CM | POA: Diagnosis not present

## 2019-12-18 DIAGNOSIS — R269 Unspecified abnormalities of gait and mobility: Secondary | ICD-10-CM | POA: Diagnosis not present

## 2019-12-21 DIAGNOSIS — R269 Unspecified abnormalities of gait and mobility: Secondary | ICD-10-CM | POA: Diagnosis not present

## 2019-12-21 DIAGNOSIS — M25571 Pain in right ankle and joints of right foot: Secondary | ICD-10-CM | POA: Diagnosis not present

## 2019-12-22 ENCOUNTER — Ambulatory Visit: Payer: 59 | Admitting: Sports Medicine

## 2019-12-29 ENCOUNTER — Other Ambulatory Visit: Payer: Self-pay

## 2019-12-29 ENCOUNTER — Other Ambulatory Visit (HOSPITAL_COMMUNITY): Payer: Self-pay | Admitting: Urology

## 2019-12-29 ENCOUNTER — Ambulatory Visit: Payer: 59 | Admitting: Sports Medicine

## 2019-12-29 VITALS — BP 108/70 | Ht 71.0 in | Wt 175.0 lb

## 2019-12-29 DIAGNOSIS — M25552 Pain in left hip: Secondary | ICD-10-CM | POA: Diagnosis not present

## 2019-12-29 DIAGNOSIS — N401 Enlarged prostate with lower urinary tract symptoms: Secondary | ICD-10-CM | POA: Diagnosis not present

## 2019-12-29 DIAGNOSIS — M25551 Pain in right hip: Secondary | ICD-10-CM | POA: Diagnosis not present

## 2019-12-29 DIAGNOSIS — M79671 Pain in right foot: Secondary | ICD-10-CM | POA: Insufficient documentation

## 2019-12-29 DIAGNOSIS — M7671 Peroneal tendinitis, right leg: Secondary | ICD-10-CM | POA: Diagnosis not present

## 2019-12-29 DIAGNOSIS — N5201 Erectile dysfunction due to arterial insufficiency: Secondary | ICD-10-CM | POA: Diagnosis not present

## 2019-12-29 DIAGNOSIS — R351 Nocturia: Secondary | ICD-10-CM | POA: Diagnosis not present

## 2019-12-29 NOTE — Assessment & Plan Note (Signed)
Pain does seem to be consistent with a Morton's neuroma, could be early in the course, not seen on ultrasound today.  Could also consider metatarsalgia, he has collapse of his transverse arch.  Metatarsal pad placed in his shoe on the right foot.  We will see if this helps, return to care if no improvement.

## 2019-12-29 NOTE — Progress Notes (Signed)
Sean Cochran is a 59 y.o. male who presents to [facility] today for the following:  F/U Right Ankle Pain Seen on 11/24/2019, diagnosed with peroneal tendinitis Ultrasound at that time showed significant fluid around peroneal longus tendon specifically Patient has been doing physical therapy, got about 5-6 times and has an appointment tomorrow Reports that his pain is significantly improving, thinks he is about 50% back to normal He has been using a heel lift in his shoe to help with his Achilles tendinopathy as well and has had significant improvement with this, reports that he has no pain at the Achilles now  Right foot pain He reports that over the last few weeks, he has been noticing worsening pain between his second and third metatarsal heads Reports that pain worsens after walking frequently States that he and his wife walk quite a lot and increase a lot during the COVID-19 pandemic during which she started to notice the pain Reports that he is having significant pain and soreness today Today is the first day he has had numbness and tingling into his second and third toes  Bilateral hip pain Patient reports that over the last few years he has been noticing more pain in his bilateral hips, left worse than right Reports he has chronic back pain and wears a support brace while he is working outside and heavy lifting to help with this Reports that pain is mostly posterior hip, denies pain in the groin Reports that after sitting for long periods, he feels as though he needs to sit to shake his legs out in order to be able to stand Denies numbness and tingling that moves down the legs   PMH reviewed.  ROS as above. Medications reviewed.  Exam:  BP 108/70   Ht 5' 11"  (1.803 m)   Wt 175 lb (79.4 kg)   BMI 24.41 kg/m  Gen: Well NAD MSK:  Right ankle: - Inspection: No obvious deformity, erythema, swelling, or ecchymosis, ulcers, calluses, blisters - Palpation: No TTP at MT heads, no  TTP at base of 5th MT, no TTP over cuboid, no tenderness over navicular prominence, no TTP over lateral or medial malleolus.  No sign of peroneal tendon subluxation or TTP. - Strength: Normal strength with dorsiflexion, plantarflexion, inversion, and eversion of foot; flexion and extension of toes - ROM: Full ROM - Neuro/vasc: NV intact - Special Tests: Increased laxity with talar tilt on right compared to left, negative anterior drawer.  Negative syndesmotic compression.  Feet: Collapse of transverse arch on right foot with splaying of first and second digits  Limited Bilateral Hips:  - Palpation: No TTP over greater trochanter b/l, some TTP right gluteus medius - ROM: Normal range of motion on internal and external rotation - Strength: 4/5 strength hip abd right, 5/5 strength left - Neuro/vasc: NV intact distally - Special Tests: Positive Trendelenburg on right  Limited US right ankle:  - Peroneus longus and brevis: Evaluated in longitudinal transverse planes. Intact no evidence of tenosynovitis. - Tibial posterior: Evaluated in transverse plane.  Tendon intact with no evidence of tenosynovitis - Flexor digitorum: Evaluate in transverse plane.  Tendon intact with no evidence of tenosynovitis  Limited right foot ultrasound: No obvious Morton's neuroma visualized between second and third metatarsal heads    No results found.   Assessment and Plan: 1) Peroneal tendinitis of right lower extremity Improved.  No fluid noted around peroneal tendons on this ultrasound.  His symptoms have also improved.  Can continue with  physical therapy.  Return to care if no improvement.  Right foot pain Pain does seem to be consistent with a Morton's neuroma, could be early in the course, not seen on ultrasound today.  Could also consider metatarsalgia, he has collapse of his transverse arch.  Metatarsal pad placed in his shoe on the right foot.  We will see if this helps, return to care if no  improvement.  Bilateral hip pain He has significant hip abductor weakness in his right greater than left and a positive right Trendelenburg on the right.  This is likely contributing to his symptoms.  Discussed with the patient that strengthening of this would be integral to improving his pain.  He was given strengthening exercises for hip abduction.  He will return to care if no improvement.  Advised him that he does not seem to have any intra-articular pathology at this time that is contributing to his symptoms.   [place] Constable, D.O.  PGY-3 Family Medicine  12/29/2019 5:14 PM   Patient seen and evaluated with the resident.  I agree with the above plan of care.  Peroneal tendinitis is improving with treatment.  He now has some forefoot pain which we will treat with a metatarsal pad.  Patient is reassured that his hip pain is not secondary to osteoarthritis.  We have given him some home exercises to work on hip strengthening.  Patient may continue with all activity using pain as his guide and follow-up with me as needed.

## 2019-12-29 NOTE — Patient Instructions (Signed)
We are glad your ankle is feeling better.  We will place a metatarsal pad in your shoe to help with your foot pain.  Your pain is mostly coming from weak hip muscles.  Work on strengthening them with what we gave you and at PT.  Work on Nurse, adult core as well to help with your back pain.  Come back if you aren't having improvement.

## 2019-12-29 NOTE — Assessment & Plan Note (Signed)
He has significant hip abductor weakness in his right greater than left and a positive right Trendelenburg on the right.  This is likely contributing to his symptoms.  Discussed with the patient that strengthening of this would be integral to improving his pain.  He was given strengthening exercises for hip abduction.  He will return to care if no improvement.  Advised him that he does not seem to have any intra-articular pathology at this time that is contributing to his symptoms.

## 2019-12-29 NOTE — Assessment & Plan Note (Signed)
Improved.  No fluid noted around peroneal tendons on this ultrasound.  His symptoms have also improved.  Can continue with physical therapy.  Return to care if no improvement.

## 2019-12-30 DIAGNOSIS — M25571 Pain in right ankle and joints of right foot: Secondary | ICD-10-CM | POA: Diagnosis not present

## 2019-12-30 DIAGNOSIS — R269 Unspecified abnormalities of gait and mobility: Secondary | ICD-10-CM | POA: Diagnosis not present

## 2020-01-01 DIAGNOSIS — N401 Enlarged prostate with lower urinary tract symptoms: Secondary | ICD-10-CM | POA: Diagnosis not present

## 2020-01-01 DIAGNOSIS — R351 Nocturia: Secondary | ICD-10-CM | POA: Diagnosis not present

## 2020-01-01 DIAGNOSIS — E291 Testicular hypofunction: Secondary | ICD-10-CM | POA: Diagnosis not present

## 2020-01-06 DIAGNOSIS — R269 Unspecified abnormalities of gait and mobility: Secondary | ICD-10-CM | POA: Diagnosis not present

## 2020-01-06 DIAGNOSIS — M25571 Pain in right ankle and joints of right foot: Secondary | ICD-10-CM | POA: Diagnosis not present

## 2020-01-11 DIAGNOSIS — T1501XA Foreign body in cornea, right eye, initial encounter: Secondary | ICD-10-CM | POA: Diagnosis not present

## 2020-02-29 ENCOUNTER — Other Ambulatory Visit: Payer: 59

## 2020-02-29 DIAGNOSIS — Z20822 Contact with and (suspected) exposure to covid-19: Secondary | ICD-10-CM | POA: Diagnosis not present

## 2020-03-01 LAB — SARS-COV-2, NAA 2 DAY TAT

## 2020-03-01 LAB — NOVEL CORONAVIRUS, NAA: SARS-CoV-2, NAA: NOT DETECTED

## 2020-03-17 ENCOUNTER — Ambulatory Visit: Payer: 59 | Admitting: Sports Medicine

## 2020-03-17 ENCOUNTER — Other Ambulatory Visit: Payer: Self-pay

## 2020-03-17 ENCOUNTER — Ambulatory Visit
Admission: RE | Admit: 2020-03-17 | Discharge: 2020-03-17 | Disposition: A | Discharge status: Home / Self Care | Payer: 59 | Source: Ambulatory Visit | Attending: Sports Medicine | Admitting: Sports Medicine

## 2020-03-17 VITALS — BP 118/82 | Ht 71.0 in | Wt 175.0 lb

## 2020-03-17 DIAGNOSIS — M79674 Pain in right toe(s): Secondary | ICD-10-CM | POA: Diagnosis not present

## 2020-03-17 DIAGNOSIS — M19071 Primary osteoarthritis, right ankle and foot: Secondary | ICD-10-CM | POA: Diagnosis not present

## 2020-03-18 NOTE — Progress Notes (Signed)
   Subjective:    Patient ID: Sean Cochran, male    DOB: Jul 30, 1960, 60 y.o.   MRN: [id number]  HPI chief complaint: Right second toe pain  Patient comes in today complaining of several weeks of right second toe pain.  Pain began acutely when playing tennis.  Since then, pain has persisted.  It is primarily in the PIP joint of the right second toe.  He denies any swelling or bruising.  He has had metatarsal pain in the past in the same foot and has a metatarsal pad in his shoe.  He is also describing numbness along the lateral aspect of each foot, near the fifth metatarsal.  It does not radiate up the legs.  It is different than the tendinitis that he had around his ankle previously which resolved with physical therapy Jenny Reichmann O'Halloran).    Review of Systems As above    Objective:   Physical Exam  Well-developed, well-nourished.  No acute distress  Right foot: There is no tenderness to palpation at the metatarsal heads.  Evaluation of the right second toe shows some hypertrophy of the PIP joint but no gross deformity.  Flexion and extension is intact.  With flexion, the toe does tend to deviate medially but this is the same in his uninvolved left foot.  Good joint stability.  X-rays of his right second toe show no obvious fracture or arthritis      Assessment & Plan:   Right second toe pain-question ligamentous sprain  Reassurance regarding his x-ray.  His metatarsal pad appears to be incorrectly placed in his shoe so we will adjust that today.  He is an avid Firefighter, so with his forefoot pain I think he would benefit from the extra cushioning that custom orthotics will offer.  He will follow-up next week for those.  We will then plan on a 4-week follow-up thereafter to check his progress.

## 2020-03-22 ENCOUNTER — Encounter: Payer: Self-pay | Admitting: Sports Medicine

## 2020-03-22 ENCOUNTER — Other Ambulatory Visit: Payer: Self-pay

## 2020-03-22 ENCOUNTER — Ambulatory Visit: Payer: 59 | Admitting: Sports Medicine

## 2020-03-22 VITALS — BP 122/78 | Ht 71.0 in | Wt 175.0 lb

## 2020-03-22 DIAGNOSIS — M79671 Pain in right foot: Secondary | ICD-10-CM | POA: Diagnosis not present

## 2020-03-22 DIAGNOSIS — M79674 Pain in right toe(s): Secondary | ICD-10-CM

## 2020-03-22 NOTE — Patient Instructions (Addendum)
We are going to refer you to Dr. Lucia Gaskins for your continued 2nd toe pain.  [place], [place]  Appt: 03/28/20 @ 12 pm, arrival time of 11:30 am.

## 2020-03-22 NOTE — Progress Notes (Signed)
Patient presents for follow-up for orthotics.  Please see previous notes from Dr. Alinda Money previous visits for details regarding metatarsalgia.  Patient was fitted for a : standard, cushioned, semi-rigid orthotic. The orthotic was heated and afterward the patient stood on the orthotic blank positioned on the orthotic stand. The patient was positioned in subtalar neutral position and 10 degrees of ankle dorsiflexion in a weight bearing stance. After completion of molding, a stable base was applied to the orthotic blank. The blank was ground to a stable position for weight bearing. Size: 10 Base: Blue EVA Posting: None Additional orthotic padding: Metatarsal pad was provided, however not placed.  Plan is for patient to trial with the orthotics without the metatarsal pad, and showed him how to place it appropriately if needed.  After making orthotics, patient was watched walking down the hallway.  He had improvement in his longitudinal arch collapse, and reported comfort while walking.  We will plan to trial these, discussed that he can call at any point if adjustments are required.  Dagoberto Ligas, MD Sports Medicine Fellow, [place]  Patient seen and evaluated with the sports medicine fellow.  I agree with the above plan of care.  In addition to his custom orthotics, we will refer him to Dr. Lucia Gaskins for his chronic second toe pain.  Recent x-rays were unremarkable.  Follow-up with [place] as needed.

## 2020-03-28 DIAGNOSIS — M2041 Other hammer toe(s) (acquired), right foot: Secondary | ICD-10-CM | POA: Diagnosis not present

## 2020-04-14 ENCOUNTER — Other Ambulatory Visit: Payer: Self-pay

## 2020-04-14 ENCOUNTER — Telehealth: Payer: Self-pay | Admitting: Gastroenterology

## 2020-04-14 DIAGNOSIS — K760 Fatty (change of) liver, not elsewhere classified: Secondary | ICD-10-CM

## 2020-04-14 NOTE — Telephone Encounter (Signed)
NASH FibroSURE, CMP, and CBC Thank you.

## 2020-04-14 NOTE — Telephone Encounter (Signed)
Pt is requesting a call back from a nurse to discuss scheduling a NASH test.

## 2020-04-14 NOTE — Telephone Encounter (Signed)
Orders in epic. Pt may come to lab M-F between the hours of 7:30am-5pm, no appt needed.

## 2020-04-14 NOTE — Telephone Encounter (Signed)
Pt calling to schedule NASH Fibrosure test. Is there any other labs you would like for him to have when he comes tot he lab?

## 2020-04-20 ENCOUNTER — Other Ambulatory Visit (INDEPENDENT_AMBULATORY_CARE_PROVIDER_SITE_OTHER): Payer: 59

## 2020-04-20 DIAGNOSIS — K760 Fatty (change of) liver, not elsewhere classified: Secondary | ICD-10-CM

## 2020-04-20 LAB — COMPREHENSIVE METABOLIC PANEL
ALT: 35 U/L (ref 0–53)
AST: 21 U/L (ref 0–37)
Albumin: 4.6 g/dL (ref 3.5–5.2)
Alkaline Phosphatase: 60 U/L (ref 39–117)
BUN: 22 mg/dL (ref 6–23)
CO2: 31 mEq/L (ref 19–32)
Calcium: 9.9 mg/dL (ref 8.4–10.5)
Chloride: 102 mEq/L (ref 96–112)
Creatinine, Ser: 0.98 mg/dL (ref 0.40–1.50)
GFR: 84.97 mL/min (ref >60.00)
Glucose, Bld: 93 mg/dL (ref 70–99)
Potassium: 4.1 mEq/L (ref 3.5–5.1)
Sodium: 141 mEq/L (ref 135–145)
Total Bilirubin: 1 mg/dL (ref 0.2–1.2)
Total Protein: 7.1 g/dL (ref 6.0–8.3)

## 2020-04-20 LAB — CBC WITH DIFFERENTIAL/PLATELET
Basophils Absolute: 0.1 10*3/uL (ref 0.0–0.1)
Basophils Relative: 2 % (ref 0.0–3.0)
Eosinophils Absolute: 0.3 10*3/uL (ref 0.0–0.7)
Eosinophils Relative: 6.6 % — ABNORMAL HIGH (ref 0.0–5.0)
HCT: 44.4 % (ref 39.0–52.0)
Hemoglobin: 15.3 g/dL (ref 13.0–17.0)
Lymphocytes Relative: 25 % (ref 12.0–46.0)
Lymphs Abs: 1.2 10*3/uL (ref 0.7–4.0)
MCHC: 34.4 g/dL (ref 30.0–36.0)
MCV: 89 fl (ref 78.0–100.0)
Monocytes Absolute: 0.5 10*3/uL (ref 0.1–1.0)
Monocytes Relative: 9.4 % (ref 3.0–12.0)
Neutro Abs: 2.8 10*3/uL (ref 1.4–7.7)
Neutrophils Relative %: 57 % (ref 43.0–77.0)
Platelets: 198 10*3/uL (ref 150.0–400.0)
RBC: 4.99 Mil/uL (ref 4.22–5.81)
RDW: 13.1 % (ref 11.5–15.5)
WBC: 4.8 10*3/uL (ref 4.0–10.5)

## 2020-04-25 LAB — NASH FIBROSURE
ALPHA 2-MACROGLOBULINS, QN: 135 mg/dL (ref 110–276)
ALT (SGPT) P5P: 45 IU/L (ref 0–55)
AST (SGOT) P5P: 25 IU/L (ref 0–40)
Apolipoprotein A-1: 126 mg/dL (ref 101–178)
Bilirubin, Total: 0.7 mg/dL (ref 0.0–1.2)
Cholesterol, Total: 231 mg/dL — ABNORMAL HIGH (ref 100–199)
Fibrosis Score: 0.47 — ABNORMAL HIGH (ref 0.00–0.21)
GGT: 131 IU/L — ABNORMAL HIGH (ref 0–65)
Glucose: 95 mg/dL (ref 65–99)
Haptoglobin: 55 mg/dL (ref 29–370)
Height: 71 in
NASH Score: 0.25
Steatosis Score: 0.67 — ABNORMAL HIGH (ref 0.00–0.30)
Triglycerides: 115 mg/dL (ref 0–149)
Weight: 175 [lb_av]

## 2020-05-02 ENCOUNTER — Telehealth: Payer: Self-pay

## 2020-05-02 NOTE — Telephone Encounter (Signed)
Pt called back regarding his labs results, he is aware. He wanted to schedule an OV to discuss results with Dr. Tarri Glenn further. Pt scheduled to see D. Beavers tomorrow at 2:10pm.

## 2020-05-03 ENCOUNTER — Telehealth: Payer: Self-pay | Admitting: Internal Medicine

## 2020-05-03 ENCOUNTER — Encounter: Payer: Self-pay | Admitting: Gastroenterology

## 2020-05-03 ENCOUNTER — Telehealth: Payer: Self-pay

## 2020-05-03 ENCOUNTER — Ambulatory Visit: Payer: 59 | Admitting: Gastroenterology

## 2020-05-03 VITALS — BP 122/68 | HR 70 | Ht 71.0 in | Wt 177.0 lb

## 2020-05-03 DIAGNOSIS — R748 Abnormal levels of other serum enzymes: Secondary | ICD-10-CM

## 2020-05-03 DIAGNOSIS — K760 Fatty (change of) liver, not elsewhere classified: Secondary | ICD-10-CM

## 2020-05-03 NOTE — Telephone Encounter (Signed)
What type of arthritis?

## 2020-05-03 NOTE — Telephone Encounter (Signed)
Patient is wondering if he can get his lab work done before his cpe on 03.22.22. he is also wanted to ask, hes been seeing a sports med doctor and they told him the next time he got blood work done to see if they could add on the blood test for arthritis.

## 2020-05-03 NOTE — Telephone Encounter (Signed)
Pt arrived for appt and he was not on the schedule. Checked with Dr. Tarri Glenn and she states she will see the pt. Let pt know Dr. Tarri Glenn will see him but he may have to wait or he could reschedule. Pt states he is fine waiting to see Dr. Tarri Glenn. Pt added to the schedule.

## 2020-05-03 NOTE — Progress Notes (Signed)
Referring Provider: Janith Lima, MD Primary Care Physician:  Janith Lima, MD    Chief complaint:  Steatosis   IMPRESSION:  Suspected fatty liver based on FibroSURE test    - 2020: Elevated ALT 45-71, elevated GGTP    - 2020 Fibrosure: F1-F2 fibrosis, S3 steatosis (marked or severe) without significant inflammation    - 2020 Fibrosure: F1-F2 fibrosis, S2 steatosis without significant inflammation History of rectal bleeding due to an anal fissure ANA + 1:40 in a speckled pattern    - Smooth muscle Ab negative    - LKM antibody negative History of colon polyp on colonoscopy in 2014    - two small tubular adenoma removed on colonoscopy 07/15/18    - surveillance recommended in 7 years Family history of celiac disease (niece and nephew)  Fatty liver. Reviewed FibroSURE results. Extensive discussion reviewing each lab abnormality. No significant change. Continue treatment focusing on a healthy weight, regular exercise, and maximizing control of diabetes and cholesterol abnormalities. I have advised that he avoid alcohol, get plenty of sleep, and consider drinking 2-3 cups of regular coffee every day.     PLAN: FibroSURE in 18-24 months Office visit 2 weeks after the FibroSURE to review the results Surveillance in colonoscopy in 2027 Return earlier with any additional questions or concerns    HPI: Sean Cochran is a 60 y.o. who returns in follow-up for abnormal liver enzymes. The interval history is obtained through the patient and review of his electronic health record. He was last seen 07/2018.   His health has been good. He has lost 20 pounds in the last 2 years. He is walking more and eating better.  Reduced alcohol in his diet but has not reduced it all together.   Labs 04/20/20: normal CMP with ALT 35, normal CBC NASH FibroSURE 04/20/20: F1-F2, steatosis S2  For an NO - not NASH  He had a colonoscopy 07/15/18 that revealed two small tubular adenomas and an anal  fissure.   No new complaints or concerns.     Past Medical History:  Diagnosis Date  . Abnormal finding on MRI of brain 10/04/2014  . Back pain   . Colon polyp 02/19/2013   Dr. Collene Mares - Repeat in 5 years  . Constipation   . Hip pain   . History of pineal cyst   . Hyperlipidemia   . Memory loss     Past Surgical History:  Procedure Laterality Date  . BICEPS TENDON REPAIR Left   . COLONOSCOPY    . ROTATOR CUFF REPAIR Right   . TONSILLECTOMY    . VASECTOMY      Current Outpatient Medications  Medication Sig Dispense Refill  . finasteride (PROPECIA) 1 MG tablet Take 1 mg by mouth daily.     No current facility-administered medications for this visit.    Allergies as of 05/03/2020  . (No Known Allergies)    Family History  Problem Relation Age of Onset  . Cancer Father        Lymphoma  . Depression Sister   . Heart attack Brother   . Healthy Brother   . Healthy Brother   . Cancer Maternal Grandfather        Paternal Grandfather  . Prostate cancer Maternal Grandfather   . Prostate cancer Paternal Grandfather   . Colon cancer Neg Hx   . Colon polyps Neg Hx   . Esophageal cancer Neg Hx   . Rectal cancer Neg Hx   .  Stomach cancer Neg Hx     Physical Exam: General: in no acute distress HEENT: No scleral icterus, no xanthaloma Neuro: Alert and appropriate Psych: Normal affect and normal insight Skin: No palmar erythema. No Terry's nail.  No spider angiomas on the face.   Kimberly L. Tarri Glenn, MD, MPH [facility] 05/03/2020, 3:05 PM

## 2020-05-03 NOTE — Patient Instructions (Addendum)
It was a pleasure to see you today. Based on our discussion, I am providing you with my recommendations below:  RECOMMENDATION(S):   Please follow up with Dr. Ronnald Ramp about cholesterol management  We will plan to repeat your colonoscopy in 2027. You will receive a call from our office when it is time to schedule.  I would like for you to repeat your labs in 18-24 months.   BMI:  . If you are age 59 or younger, your body mass index should be between 19-25. Your There is no height or weight on file to calculate BMI. If this is out of the aformentioned range listed, please consider follow up with your Primary Care Provider.   Thank you for trusting me with your gastrointestinal care!    Thornton [place], MD, MPH

## 2020-05-04 ENCOUNTER — Other Ambulatory Visit: Payer: Self-pay | Admitting: Internal Medicine

## 2020-05-04 DIAGNOSIS — Z0000 Encounter for general adult medical examination without abnormal findings: Secondary | ICD-10-CM

## 2020-05-04 DIAGNOSIS — K7581 Nonalcoholic steatohepatitis (NASH): Secondary | ICD-10-CM

## 2020-05-04 DIAGNOSIS — E785 Hyperlipidemia, unspecified: Secondary | ICD-10-CM

## 2020-05-04 DIAGNOSIS — M25552 Pain in left hip: Secondary | ICD-10-CM

## 2020-05-04 DIAGNOSIS — M25551 Pain in right hip: Secondary | ICD-10-CM

## 2020-05-04 NOTE — Telephone Encounter (Signed)
Labs ordered.

## 2020-05-04 NOTE — Telephone Encounter (Signed)
Patient called again and he said he believes that it is for rheumatoid arthritis. He is also requesting a call back at [phone]. Please advise

## 2020-05-04 NOTE — Telephone Encounter (Signed)
Called pt, LVM.   

## 2020-05-04 NOTE — Telephone Encounter (Signed)
Patient returned call. He can be reached at [phone]

## 2020-05-04 NOTE — Telephone Encounter (Signed)
Blood work for RA needed. Please advise if the order will be placed.

## 2020-05-05 ENCOUNTER — Other Ambulatory Visit (INDEPENDENT_AMBULATORY_CARE_PROVIDER_SITE_OTHER): Payer: 59

## 2020-05-05 DIAGNOSIS — M25552 Pain in left hip: Secondary | ICD-10-CM

## 2020-05-05 DIAGNOSIS — Z0000 Encounter for general adult medical examination without abnormal findings: Secondary | ICD-10-CM

## 2020-05-05 DIAGNOSIS — E785 Hyperlipidemia, unspecified: Secondary | ICD-10-CM | POA: Diagnosis not present

## 2020-05-05 DIAGNOSIS — M25551 Pain in right hip: Secondary | ICD-10-CM

## 2020-05-05 LAB — LIPID PANEL
Cholesterol: 235 mg/dL — ABNORMAL HIGH (ref 0–200)
HDL: 47.2 mg/dL (ref >39.00)
LDL Cholesterol: 166 mg/dL — ABNORMAL HIGH (ref 0–99)
NonHDL: 187.91
Total CHOL/HDL Ratio: 5
Triglycerides: 111 mg/dL (ref 0.0–149.0)
VLDL: 22.2 mg/dL (ref 0.0–40.0)

## 2020-05-05 LAB — C-REACTIVE PROTEIN: CRP: <1 mg/dL (ref 0.5–20.0)

## 2020-05-05 LAB — PSA: PSA: 0.23 ng/mL (ref 0.10–4.00)

## 2020-05-05 LAB — CK: Total CK: 96 U/L (ref 7–232)

## 2020-05-05 NOTE — Telephone Encounter (Signed)
Called pt, LVM that labs have been ordered.

## 2020-05-06 LAB — RHEUMATOID FACTOR: Rheumatoid fact SerPl-aCnc: <14 IU/mL (ref <14)

## 2020-05-06 LAB — ANA: Anti Nuclear Antibody (ANA): NEGATIVE

## 2020-05-06 LAB — CYCLIC CITRUL PEPTIDE ANTIBODY, IGG: Cyclic Citrullin Peptide Ab: <16 UNITS

## 2020-05-10 ENCOUNTER — Ambulatory Visit: Payer: 59 | Admitting: Gastroenterology

## 2020-05-24 ENCOUNTER — Other Ambulatory Visit: Payer: Self-pay

## 2020-05-24 ENCOUNTER — Encounter: Payer: Self-pay | Admitting: Internal Medicine

## 2020-05-24 ENCOUNTER — Ambulatory Visit (INDEPENDENT_AMBULATORY_CARE_PROVIDER_SITE_OTHER): Payer: 59 | Admitting: Internal Medicine

## 2020-05-24 VITALS — BP 116/68 | HR 62 | Temp 98.3°F | Ht 71.0 in | Wt 178.0 lb

## 2020-05-24 DIAGNOSIS — M25551 Pain in right hip: Secondary | ICD-10-CM | POA: Diagnosis not present

## 2020-05-24 DIAGNOSIS — Z23 Encounter for immunization: Secondary | ICD-10-CM | POA: Diagnosis not present

## 2020-05-24 DIAGNOSIS — M25552 Pain in left hip: Secondary | ICD-10-CM

## 2020-05-24 DIAGNOSIS — E785 Hyperlipidemia, unspecified: Secondary | ICD-10-CM

## 2020-05-24 DIAGNOSIS — K7581 Nonalcoholic steatohepatitis (NASH): Secondary | ICD-10-CM | POA: Diagnosis not present

## 2020-05-24 DIAGNOSIS — Z0000 Encounter for general adult medical examination without abnormal findings: Secondary | ICD-10-CM

## 2020-05-24 NOTE — Progress Notes (Signed)
Subjective:  Patient ID: Sean Cochran, male    DOB: May 18, 1960  Age: 60 y.o. MRN: [id number]  CC: Annual Exam  This visit occurred during the SARS-CoV-2 public health emergency.  Safety protocols were in place, including screening questions prior to the visit, additional usage of staff PPE, and extensive cleaning of exam room while observing appropriate contact time as indicated for disinfecting solutions.    HPI Sean Cochran presents for a CPX.  He continues to complain of arthralgias in his hips.  He gets symptom relief with Motrin.  His recent labs were negative for inflammatory arthropathy or rheumatological disease.  He plays tennis and does not experience CP, DOE, palpitations, edema, or fatigue.  Outpatient Medications Prior to Visit  Medication Sig Dispense Refill  . finasteride (PROPECIA) 1 MG tablet Take 1 mg by mouth daily.     No facility-administered medications prior to visit.    ROS Review of Systems  Constitutional: Negative.  Negative for appetite change, chills, diaphoresis, fatigue and fever.  HENT: Negative.   Eyes: Negative for visual disturbance.  Respiratory: Negative for cough, chest tightness, shortness of breath and wheezing.   Cardiovascular: Negative for chest pain, palpitations and leg swelling.  Gastrointestinal: Negative for abdominal pain, constipation, diarrhea, nausea and vomiting.  Endocrine: Negative.   Genitourinary: Negative.  Negative for difficulty urinating, scrotal swelling and testicular pain.  Musculoskeletal: Positive for arthralgias. Negative for joint swelling and myalgias.  Skin: Negative.  Negative for color change.  Neurological: Negative.  Negative for weakness.  Hematological: Negative for adenopathy. Does not bruise/bleed easily.  Psychiatric/Behavioral: Negative.     Objective:  BP 116/68   Pulse 62   Temp 98.3 F (36.8 C) (Oral)   Ht 5' 11"  (1.803 m)   Wt 178 lb (80.7 kg)   SpO2 98%   BMI 24.83 kg/m   BP  Readings from Last 3 Encounters:  05/24/20 116/68  05/03/20 122/68  03/22/20 122/78    Wt Readings from Last 3 Encounters:  05/24/20 178 lb (80.7 kg)  05/03/20 177 lb (80.3 kg)  03/22/20 175 lb (79.4 kg)    Physical Exam Vitals reviewed.  Constitutional:      Appearance: Normal appearance.  HENT:     Nose: Nose normal.     Mouth/Throat:     Mouth: Mucous membranes are moist.  Eyes:     General: No scleral icterus.    Conjunctiva/sclera: Conjunctivae normal.  Cardiovascular:     Rate and Rhythm: Normal rate and regular rhythm.     Pulses: Normal pulses.     Heart sounds: No murmur heard.   Pulmonary:     Effort: Pulmonary effort is normal.     Breath sounds: No stridor. No wheezing, rhonchi or rales.  Abdominal:     General: Abdomen is flat.     Palpations: There is no mass.     Tenderness: There is no abdominal tenderness. There is no guarding.     Hernia: No hernia is present.  Genitourinary:    Comments: GU and rectal exams were deferred since he had this done about 4 months ago by a urologist. Musculoskeletal:        General: Normal range of motion.     Cervical back: Neck supple.     Right lower leg: No edema.     Left lower leg: No edema.  Lymphadenopathy:     Cervical: No cervical adenopathy.  Skin:    General: Skin is warm and dry.  Coloration: Skin is not pale.  Neurological:     General: No focal deficit present.     Mental Status: He is alert.  Psychiatric:        Mood and Affect: Mood normal.        Behavior: Behavior normal.     Lab Results  Component Value Date   WBC 4.8 04/20/2020   HGB 15.3 04/20/2020   HCT 44.4 04/20/2020   PLT 198.0 04/20/2020   GLUCOSE 93 04/20/2020   CHOL 235 (H) 05/05/2020   TRIG 111.0 05/05/2020   HDL 47.20 05/05/2020   LDLCALC 166 (H) 05/05/2020   ALT 35 04/20/2020   AST 21 04/20/2020   NA 141 04/20/2020   K 4.1 04/20/2020   CL 102 04/20/2020   CREATININE 0.98 04/20/2020   BUN 22 04/20/2020   CO2 31  04/20/2020   TSH 3.72 06/12/2018   PSA 0.23 05/05/2020   INR 1.1 (H) 06/12/2018    DG Toe 2nd Right  Result Date: 03/17/2020 CLINICAL DATA:  Pain at the PIP joint of the right second toe for 1 month. EXAM: RIGHT SECOND TOE COMPARISON:  None. FINDINGS: The right second toe appears normal without fracture, dislocation or arthropathy. There is mild first MTP osteoarthritis. Soft tissues are negative. IMPRESSION: Normal-appearing second toe. Mild appearing first MTP osteoarthritis. Electronically Signed   By: Inge Rise M.D.   On: 03/17/2020 15:47    Assessment & Plan:   Sean Cochran was seen today for annual exam.  Diagnoses and all orders for this visit:  Nonalcoholic steatohepatitis (NASH)- His liver enzymes have improved with lifestyle modifications.  Routine general medical examination at a health care facility- Exam completed, labs reviewed, vaccines reviewed and updated, cancer screenings are up-to-date, patient education was given.  Hyperlipidemia LDL goal <160- His ASCVD risk score is at 8.3%.  I therefore did not recommend that he take a statin for CV risk reduction.  Bilateral hip pain- Symptoms, exam, and labs are consistent with osteoarthritis.  Other orders -     Tdap vaccine greater than or equal to 7yo IM -     Varicella-zoster vaccine IM (Shingrix)   I am having Sean Cochran maintain his finasteride.  No orders of the defined types were placed in this encounter.    Follow-up: Return in about 4 months (around 09/23/2020).  Scarlette Calico, MD

## 2020-05-24 NOTE — Patient Instructions (Signed)

## 2020-06-20 ENCOUNTER — Other Ambulatory Visit (HOSPITAL_COMMUNITY): Payer: Self-pay

## 2020-06-20 ENCOUNTER — Telehealth (INDEPENDENT_AMBULATORY_CARE_PROVIDER_SITE_OTHER): Payer: 59 | Admitting: Internal Medicine

## 2020-06-20 ENCOUNTER — Encounter: Payer: Self-pay | Admitting: Internal Medicine

## 2020-06-20 DIAGNOSIS — J309 Allergic rhinitis, unspecified: Secondary | ICD-10-CM | POA: Diagnosis not present

## 2020-06-20 DIAGNOSIS — J329 Chronic sinusitis, unspecified: Secondary | ICD-10-CM | POA: Diagnosis not present

## 2020-06-20 DIAGNOSIS — T7840XA Allergy, unspecified, initial encounter: Secondary | ICD-10-CM | POA: Insufficient documentation

## 2020-06-20 MED ORDER — PREDNISONE 10 MG PO TABS
ORAL_TABLET | ORAL | 0 refills | Status: AC
  Filled 2020-06-20: qty 18, 9d supply, fill #0

## 2020-06-20 MED ORDER — CETIRIZINE HCL 10 MG PO TABS
10.0000 mg | ORAL_TABLET | Freq: Every day | ORAL | 11 refills | Status: DC
  Filled 2020-06-20: qty 30, 30d supply, fill #0

## 2020-06-20 MED ORDER — DOXYCYCLINE HYCLATE 100 MG PO TABS
100.0000 mg | ORAL_TABLET | Freq: Two times a day (BID) | ORAL | 0 refills | Status: DC
  Filled 2020-06-20: qty 20, 10d supply, fill #0

## 2020-06-20 MED ORDER — TRIAMCINOLONE ACETONIDE 55 MCG/ACT NA AERO
2.0000 | INHALATION_SPRAY | Freq: Every day | NASAL | 12 refills | Status: DC
  Filled 2020-06-20: qty 1, 1d supply, fill #0

## 2020-06-20 MED ORDER — ALBUTEROL SULFATE HFA 108 (90 BASE) MCG/ACT IN AERS
2.0000 | INHALATION_SPRAY | Freq: Four times a day (QID) | RESPIRATORY_TRACT | 0 refills | Status: DC | PRN
  Filled 2020-06-20: qty 8.5, 25d supply, fill #0

## 2020-06-20 NOTE — Assessment & Plan Note (Signed)
Mild to mod, for antibx course,  to f/u any worsening symptoms or concerns 

## 2020-06-20 NOTE — Patient Instructions (Signed)
Please take all new medication as prescribed 

## 2020-06-20 NOTE — Assessment & Plan Note (Signed)
Mild to mod, for predpac asd, inhaler prn, zyrtec and nasacort asd,  to f/u any worsening symptoms or concerns

## 2020-06-20 NOTE — Progress Notes (Signed)
Patient ID: Sean Cochran, male   DOB: October 25, 1960, 60 y.o.   MRN: [id number]  Virtual Visit via Video Note  I connected with Sean Cochran on 06/20/20 at  1:00 PM EDT by a video enabled telemedicine application and verified that I am speaking with the correct person using two identifiers.  Location of all particpants today Patient: at home Provider: at office   I discussed the limitations of evaluation and management by telemedicine and the availability of in person appointments. The patient expressed understanding and agreed to proceed.  History of Present Illness:  Here with 2-3 days acute onset fever, facial pain, pressure, headache, general weakness and malaise, and greenish d/c, with mild ST and cough, but pt denies chest pain, wheezing, increased sob or doe, orthopnea, PND, increased LE swelling, palpitations, dizziness or syncope.  Does have several wks ongoing nasal allergy symptoms with clearish congestion, itch and sneezing, without fever, pain, ST, cough, swelling but has some midl wheezing starting last PM.  Has been covid neg x 2 in last 3 days Past Medical History:  Diagnosis Date  . Abnormal finding on MRI of brain 10/04/2014  . Back pain   . Colon polyp 02/19/2013   Dr. Collene Mares - Repeat in 5 years  . Constipation   . Hip pain   . History of pineal cyst   . Hyperlipidemia   . Memory loss    Past Surgical History:  Procedure Laterality Date  . BICEPS TENDON REPAIR Left   . COLONOSCOPY    . ROTATOR CUFF REPAIR Right   . TONSILLECTOMY    . VASECTOMY      reports that he has never smoked. He has never used smokeless tobacco. He reports current alcohol use of about 3.0 standard drinks of alcohol per week. He reports that he does not use drugs. family history includes Cancer in his father and maternal grandfather; Depression in his sister; Healthy in his brother and brother; Heart attack in his brother; Prostate cancer in his maternal grandfather and paternal  grandfather. No Known Allergies Current Outpatient Medications on File Prior to Visit  Medication Sig Dispense Refill  . finasteride (PROPECIA) 1 MG tablet Take 1 mg by mouth daily.    . finasteride (PROPECIA) 1 MG tablet TAKE 1 TABLET BY MOUTH ONCE DAILY. 90 tablet 3  . sildenafil (REVATIO) 20 MG tablet TAKE 2 TO 5 TABLETS BY MOUTH DAILY AS NEEDED. 50 tablet 3   No current facility-administered medications on file prior to visit.    Observations/Objective: Alert, NAD, appropriate mood and affect, resps normal, cn 2-12 intact, moves all 4s, no visible rash or swelling Lab Results  Component Value Date   WBC 4.8 04/20/2020   HGB 15.3 04/20/2020   HCT 44.4 04/20/2020   PLT 198.0 04/20/2020   GLUCOSE 93 04/20/2020   CHOL 235 (H) 05/05/2020   TRIG 111.0 05/05/2020   HDL 47.20 05/05/2020   LDLCALC 166 (H) 05/05/2020   ALT 35 04/20/2020   AST 21 04/20/2020   NA 141 04/20/2020   K 4.1 04/20/2020   CL 102 04/20/2020   CREATININE 0.98 04/20/2020   BUN 22 04/20/2020   CO2 31 04/20/2020   TSH 3.72 06/12/2018   PSA 0.23 05/05/2020   INR 1.1 (H) 06/12/2018   Assessment and Plan: See notes  Follow Up Instructions: See notes   I discussed the assessment and treatment plan with the patient. The patient was provided an opportunity to ask questions and all were answered.  The patient agreed with the plan and demonstrated an understanding of the instructions.   The patient was advised to call back or seek an in-person evaluation if the symptoms worsen or if the condition fails to improve as anticipated.   Cathlean Cower, MD

## 2020-08-23 ENCOUNTER — Other Ambulatory Visit (HOSPITAL_COMMUNITY): Payer: Self-pay

## 2020-08-23 DIAGNOSIS — N5201 Erectile dysfunction due to arterial insufficiency: Secondary | ICD-10-CM | POA: Diagnosis not present

## 2020-08-23 DIAGNOSIS — E230 Hypopituitarism: Secondary | ICD-10-CM | POA: Diagnosis not present

## 2020-08-23 DIAGNOSIS — N451 Epididymitis: Secondary | ICD-10-CM | POA: Diagnosis not present

## 2020-08-23 MED ORDER — FINASTERIDE 1 MG PO TABS
1.0000 mg | ORAL_TABLET | Freq: Every day | ORAL | 3 refills | Status: DC
  Filled 2020-08-23 – 2020-08-29 (×2): qty 90, 90d supply, fill #0
  Filled 2020-11-28: qty 90, 90d supply, fill #1

## 2020-08-23 MED ORDER — MELOXICAM 15 MG PO TABS
15.0000 mg | ORAL_TABLET | Freq: Every day | ORAL | 1 refills | Status: DC | PRN
  Filled 2020-08-23: qty 30, 30d supply, fill #0

## 2020-08-24 ENCOUNTER — Other Ambulatory Visit (HOSPITAL_COMMUNITY): Payer: Self-pay

## 2020-08-29 ENCOUNTER — Telehealth: Payer: 59 | Admitting: Physician Assistant

## 2020-08-29 ENCOUNTER — Other Ambulatory Visit (HOSPITAL_COMMUNITY): Payer: Self-pay

## 2020-08-29 DIAGNOSIS — B9689 Other specified bacterial agents as the cause of diseases classified elsewhere: Secondary | ICD-10-CM

## 2020-08-29 DIAGNOSIS — J208 Acute bronchitis due to other specified organisms: Secondary | ICD-10-CM

## 2020-08-29 MED ORDER — ALBUTEROL SULFATE HFA 108 (90 BASE) MCG/ACT IN AERS
2.0000 | INHALATION_SPRAY | Freq: Four times a day (QID) | RESPIRATORY_TRACT | 0 refills | Status: DC | PRN
  Filled 2020-08-29: qty 18, 25d supply, fill #0

## 2020-08-29 MED ORDER — AZITHROMYCIN 250 MG PO TABS
ORAL_TABLET | ORAL | 0 refills | Status: DC
  Filled 2020-08-29: qty 6, 5d supply, fill #0

## 2020-08-29 MED ORDER — PREDNISONE 10 MG (21) PO TBPK
ORAL_TABLET | ORAL | 0 refills | Status: DC
  Filled 2020-08-29: qty 21, 6d supply, fill #0

## 2020-08-29 MED ORDER — BENZONATATE 100 MG PO CAPS
100.0000 mg | ORAL_CAPSULE | Freq: Three times a day (TID) | ORAL | 0 refills | Status: DC | PRN
  Filled 2020-08-29: qty 30, 10d supply, fill #0

## 2020-08-29 NOTE — Progress Notes (Signed)
Mr. zadrian, mccauley are scheduled for a virtual visit with your provider today.    Just as we do with appointments in the office, we must obtain your consent to participate.  Your consent will be active for this visit and any virtual visit you may have with one of our providers in the next 365 days.    If you have a MyChart account, I can also send a copy of this consent to you electronically.  All virtual visits are billed to your insurance company just like a traditional visit in the office.  As this is a virtual visit, video technology does not allow for your provider to perform a traditional examination.  This may limit your provider's ability to fully assess your condition.  If your provider identifies any concerns that need to be evaluated in person or the need to arrange testing such as labs, EKG, etc, we will make arrangements to do so.    Although advances in technology are sophisticated, we cannot ensure that it will always work on either your end or our end.  If the connection with a video visit is poor, we may have to switch to a telephone visit.  With either a video or telephone visit, we are not always able to ensure that we have a secure connection.   I need to obtain your verbal consent now.   Are you willing to proceed with your visit today?   Sean Pardon Rhatigan has provided verbal consent on 08/29/2020 for a virtual visit (video or telephone).   Mar Daring, PA-C 08/29/2020  8:51 AM  Virtual Visit Consent   Sean Pardon Quiles, you are scheduled for a virtual visit with a [facility] provider today.     Just as with appointments in the office, your consent must be obtained to participate.  Your consent will be active for this visit and any virtual visit you may have with one of our providers in the next 365 days.     If you have a MyChart account, a copy of this consent can be sent to you electronically.  All virtual visits are billed to your insurance company just like a  traditional visit in the office.    As this is a virtual visit, video technology does not allow for your provider to perform a traditional examination.  This may limit your provider's ability to fully assess your condition.  If your provider identifies any concerns that need to be evaluated in person or the need to arrange testing (such as labs, EKG, etc.), we will make arrangements to do so.     Although advances in technology are sophisticated, we cannot ensure that it will always work on either your end or our end.  If the connection with a video visit is poor, the visit may have to be switched to a telephone visit.  With either a video or telephone visit, we are not always able to ensure that we have a secure connection.     I need to obtain your verbal consent now.   Are you willing to proceed with your visit today?    Sean Pardon Cuyler has provided verbal consent on 08/29/2020 for a virtual visit (video or telephone).   Mar Daring, PA-C   Date: 08/29/2020 8:51 AM   Virtual Visit via Video Note   Reynolds Bowl, connected [email or web address] ([id number], October 18, 1960) on 08/29/20 at  8:30 AM EDT by a video-enabled telemedicine application and verified that I am  speaking with the correct person using two identifiers.  Location: Patient: Virtual Visit Location Patient: Home Provider: Virtual Visit Location Provider: Home Office   I discussed the limitations of evaluation and management by telemedicine and the availability of in person appointments. The patient expressed understanding and agreed to proceed.    History of Present Illness: Sean Cochran is a 60 y.o. who identifies as a male who was assigned male at birth, and is being seen today for URI/cough.  HPI: URI  This is a new problem. The current episode started 1 to 4 weeks ago. The problem has been gradually worsening (worsened on Saturday). There has been no fever. Associated symptoms include congestion, coughing,  headaches, rhinorrhea, sinus pain and a sore throat. Pertinent negatives include no diarrhea, ear pain, nausea, plugged ear sensation, swollen glands or wheezing. Treatments tried: Sudafed, cough drops. The treatment provided no relief.  Wife had similar symptoms about a week prior to him becoming ill and she was eventually diagnosed with atypical "walking" pneumonia.    Problems:  Patient Active Problem List   Diagnosis Date Noted   Sinusitis 06/20/2020   Allergic rhinitis 06/20/2020   Peroneal tendinitis of right lower extremity 12/29/2019   Bilateral hip pain 16/12/9602   Nonalcoholic steatohepatitis (NASH) 06/17/2018   Eczema 12/31/2016   Routine general medical examination at a health care facility 06/26/2016   Abnormal finding on MRI of brain 10/04/2014   Hyperlipidemia LDL goal <160 05/08/2013    Allergies: No Known Allergies Medications:  Current Outpatient Medications:    albuterol (VENTOLIN HFA) 108 (90 Base) MCG/ACT inhaler, Inhale 2 puffs into the lungs every 6 (six) hours as needed for wheezing or shortness of breath., Disp: 8 g, Rfl: 0   azithromycin (ZITHROMAX) 250 MG tablet, Take 2 tablets by mouth on day 1 then 1 tablet daily thereafter until completed., Disp: 6 tablet, Rfl: 0   benzonatate (TESSALON) 100 MG capsule, Take 1 capsule (100 mg total) by mouth 3 (three) times daily as needed., Disp: 30 capsule, Rfl: 0   predniSONE (STERAPRED UNI-PAK 21 TAB) 10 MG (21) TBPK tablet, Take as directed., Disp: 21 tablet, Rfl: 0   cetirizine (ZYRTEC) 10 MG tablet, Take 1 tablet (10 mg total) by mouth daily., Disp: 30 tablet, Rfl: 11   doxycycline (VIBRA-TABS) 100 MG tablet, Take 1 tablet (100 mg total) by mouth 2 (two) times daily., Disp: 20 tablet, Rfl: 0   finasteride (PROPECIA) 1 MG tablet, Take 1 mg by mouth daily., Disp: , Rfl:    finasteride (PROPECIA) 1 MG tablet, Take 1 tablet (1 mg total) by mouth daily., Disp: 90 tablet, Rfl: 3   finasteride (PROPECIA) 1 MG tablet, TAKE 1  TABLET BY MOUTH ONCE DAILY., Disp: 90 tablet, Rfl: 3   meloxicam (MOBIC) 15 MG tablet, Take 1 tablet (15 mg total) by mouth daily as needed., Disp: 30 tablet, Rfl: 1   sildenafil (REVATIO) 20 MG tablet, TAKE 2 TO 5 TABLETS BY MOUTH DAILY AS NEEDED., Disp: 50 tablet, Rfl: 3   triamcinolone (NASACORT) 55 MCG/ACT AERO nasal inhaler, Place 2 sprays into the nose daily., Disp: 1 each, Rfl: 12  Observations/Objective: Patient is well-developed, well-nourished in no acute distress.  Resting comfortably sitting at home.  Head is normocephalic, atraumatic.  No labored breathing. No adventitious lung sounds audibly heard through video Speech is clear and coherent with logical content.  Patient is alert and oriented at baseline.   Assessment and Plan: 1. Acute bacterial bronchitis - azithromycin (ZITHROMAX)  250 MG tablet; Take 2 tablets by mouth on day 1 then 1 tablet daily thereafter until completed.  Dispense: 6 tablet; Refill: 0 - predniSONE (STERAPRED UNI-PAK 21 TAB) 10 MG (21) TBPK tablet; Take as directed.  Dispense: 21 tablet; Refill: 0 - benzonatate (TESSALON) 100 MG capsule; Take 1 capsule (100 mg total) by mouth 3 (three) times daily as needed.  Dispense: 30 capsule; Refill: 0 - albuterol (VENTOLIN HFA) 108 (90 Base) MCG/ACT inhaler; Inhale 2 puffs into the lungs every 6 (six) hours as needed for wheezing or shortness of breath.  Dispense: 8 g; Refill: 0 - Worsening acutely on Saturday, despite OTC management. - - Will treat with zpak, prednisone, albuterol, and tessalon perles.  - Push fluids.  - Rest.  - Seek in person evaluation if worsening or symptoms fail to improve.   Follow Up Instructions: I discussed the assessment and treatment plan with the patient. The patient was provided an opportunity to ask questions and all were answered. The patient agreed with the plan and demonstrated an understanding of the instructions.  A copy of instructions were sent to the patient via  MyChart.  The patient was advised to call back or seek an in-person evaluation if the symptoms worsen or if the condition fails to improve as anticipated.  Time:  I spent 12 minutes with the patient via telehealth technology discussing the above problems/concerns.    Mar Daring, PA-C

## 2020-08-29 NOTE — Patient Instructions (Signed)
Sean Cochran, thank you for joining Mar Daring, PA-C for today's virtual visit.  While this provider is not your primary care provider (PCP), if your PCP is located in our provider database this encounter information will be shared with them immediately following your visit.  Consent: (Patient) Sean Cochran provided verbal consent for this virtual visit at the beginning of the encounter.  Current Medications:  Current Outpatient Medications:    albuterol (VENTOLIN HFA) 108 (90 Base) MCG/ACT inhaler, Inhale 2 puffs into the lungs every 6 (six) hours as needed for wheezing or shortness of breath., Disp: 8 g, Rfl: 0   azithromycin (ZITHROMAX) 250 MG tablet, Take 2 tablets by mouth on day 1 then 1 tablet daily thereafter until completed., Disp: 6 tablet, Rfl: 0   benzonatate (TESSALON) 100 MG capsule, Take 1 capsule (100 mg total) by mouth 3 (three) times daily as needed., Disp: 30 capsule, Rfl: 0   predniSONE (STERAPRED UNI-PAK 21 TAB) 10 MG (21) TBPK tablet, Take as directed., Disp: 21 tablet, Rfl: 0   cetirizine (ZYRTEC) 10 MG tablet, Take 1 tablet (10 mg total) by mouth daily., Disp: 30 tablet, Rfl: 11   doxycycline (VIBRA-TABS) 100 MG tablet, Take 1 tablet (100 mg total) by mouth 2 (two) times daily., Disp: 20 tablet, Rfl: 0   finasteride (PROPECIA) 1 MG tablet, Take 1 mg by mouth daily., Disp: , Rfl:    finasteride (PROPECIA) 1 MG tablet, Take 1 tablet (1 mg total) by mouth daily., Disp: 90 tablet, Rfl: 3   finasteride (PROPECIA) 1 MG tablet, TAKE 1 TABLET BY MOUTH ONCE DAILY., Disp: 90 tablet, Rfl: 3   meloxicam (MOBIC) 15 MG tablet, Take 1 tablet (15 mg total) by mouth daily as needed., Disp: 30 tablet, Rfl: 1   sildenafil (REVATIO) 20 MG tablet, TAKE 2 TO 5 TABLETS BY MOUTH DAILY AS NEEDED., Disp: 50 tablet, Rfl: 3   triamcinolone (NASACORT) 55 MCG/ACT AERO nasal inhaler, Place 2 sprays into the nose daily., Disp: 1 each, Rfl: 12   Medications ordered in this encounter:   Meds ordered this encounter  Medications   azithromycin (ZITHROMAX) 250 MG tablet    Sig: Take 2 tablets by mouth on day 1 then 1 tablet daily thereafter until completed.    Dispense:  6 tablet    Refill:  0    Order Specific Question:   Supervising Provider    Answer:   MILLER, BRIAN [[id number]]   predniSONE (STERAPRED UNI-PAK 21 TAB) 10 MG (21) TBPK tablet    Sig: Take as directed.    Dispense:  21 tablet    Refill:  0    Order Specific Question:   Supervising Provider    Answer:   MILLER, BRIAN [[id number]]   benzonatate (TESSALON) 100 MG capsule    Sig: Take 1 capsule (100 mg total) by mouth 3 (three) times daily as needed.    Dispense:  30 capsule    Refill:  0    Order Specific Question:   Supervising Provider    Answer:   MILLER, BRIAN [[id number]]   albuterol (VENTOLIN HFA) 108 (90 Base) MCG/ACT inhaler    Sig: Inhale 2 puffs into the lungs every 6 (six) hours as needed for wheezing or shortness of breath.    Dispense:  8 g    Refill:  0    Order Specific Question:   Supervising Provider    Answer:   Sabra Heck, BRIAN [[id number]]     *If you need  refills on other medications prior to your next appointment, please contact your pharmacy*  Follow-Up: Call back or seek an in-person evaluation if the symptoms worsen or if the condition fails to improve as anticipated.  If you have been instructed to have an in-person evaluation today at a local Urgent Care facility, please use the link below. It will take you to a list of all of our available [facility], including address, phone number and hours of operation. Please do not delay care.  [facility]  If you or a family member do not have a primary care provider, use the link below to schedule a visit and establish care. When you choose a [facility] primary care physician or advanced practice provider, you gain a long-term partner in health. Find a Primary Care Provider  Learn more about [facility]'s in-office and virtual  care options: [facility]  Acute Bronchitis, Adult  Acute bronchitis is sudden or acute swelling of the air tubes (bronchi) in the lungs. Acute bronchitis causes these tubes to fill with mucus, whichcan make it hard to breathe. It can also cause coughing or wheezing. In adults, acute bronchitis usually goes away within 2 weeks. A cough caused by bronchitis may last up to 3 weeks. Smoking, allergies, and asthma can make thecondition worse. What are the causes? This condition can be caused by germs and by substances that irritate the lungs, including: Cold and flu viruses. The most common cause of this condition is the virus that causes the common cold. Bacteria. Substances that irritate the lungs, including: Smoke from cigarettes and other forms of tobacco. Dust and pollen. Fumes from chemical products, gases, or burned fuel. Other materials that pollute indoor or outdoor air. Close contact with someone who has acute bronchitis. What increases the risk? The following factors may make you more likely to develop this condition: A weak body's defense system, also called the immune system. A condition that affects your lungs and breathing, such as asthma. What are the signs or symptoms? Common symptoms of this condition include: Lung and breathing problems, such as: Coughing. This may bring up clear, yellow, or green mucus from your lungs (sputum). Wheezing. Having too much mucus in your lungs (chest congestion). Having shortness of breath. A fever. Chills. Aches and pains, including: Tightness in your chest and other body aches. A sore throat. How is this diagnosed? This condition is usually diagnosed based on: Your symptoms and medical history. A physical exam. You may also have other tests, including tests to rule out other conditions, such as pneumonia. These tests include: A test of lung function. Test of a mucus sample to look for the presence of  bacteria. Tests to check the oxygen level in your blood. Blood tests. Chest X-ray. How is this treated? Most cases of acute bronchitis clear up over time without treatment. Your health care provider may recommend: Drinking more fluids. This can thin your mucus, which may improve your breathing. Using a device that gets medicine into your lungs (inhaler) to help improve breathing and control coughing. Using a vaporizer or a humidifier. These are machines that add water to the air to help you breathe better. Taking a medicine for a fever. Taking a medicine that thins mucus and clears congestion (expectorant). Taking a medicine that prevents or stops coughing (cough suppressant). Follow these instructions at home: Activity Get plenty of rest. Return to your normal activities as told by your health care provider.  Ask your health care provider what activities are safe for you. Lifestyle  Drink enough fluid to keep your urine pale yellow. Do not drink alcohol. Do not use any products that contain nicotine or tobacco, such as cigarettes, e-cigarettes, and chewing tobacco. If you need help quitting, ask your health care provider. Be aware that: Your bronchitis will get worse if you smoke or breathe in other people's smoke (secondhand smoke). Your lungs will heal faster if you quit smoking.  General instructions Take over-the-counter and prescription medicines only as told by your health care provider. Use an inhaler, vaporizer, or humidifier as told by your health care provider. If you have a sore throat, gargle with a salt-water mixture 3-4 times a day or as needed. To make a salt-water mixture, completely dissolve -1 tsp (3-6 g) of salt in 1 cup (237 mL) of warm water. Take two teaspoons of honey at bedtime to lessen coughing at night. Keep all follow-up visits as told by your health care provider. This is important. How is this prevented? To lower your risk of getting this condition  again: Wash your hands often with soap and water. If soap and water are not available, use hand sanitizer. Avoid contact with people who have cold symptoms. Try not to touch your mouth, nose, or eyes with your hands. Avoid places where there are fumes from chemicals. Breathing these fumes will make your condition worse. Get the flu shot every year. Contact a health care provider if: Your symptoms do not improve after 2 weeks of treatment. You vomit more than once or twice. You have symptoms of dehydration such as: Dark urine. Dry skin or eyes. Increased thirst. Headaches. Confusion. Muscle cramps. Get help right away if you: Cough up blood. Feel pain in your chest. Have severe shortness of breath. Faint or keep feeling like you are going to faint. Have a severe headache. Have fever or chills that get worse. These symptoms may represent a serious problem that is an emergency. Do not wait to see if the symptoms will go away. Get medical help right away. Call your local emergency services (911 in the [place] Do not drive yourself to the hospital. Summary Acute bronchitis is sudden (acute) inflammation of the air tubes (bronchi) between the windpipe and the lungs. In adults, acute bronchitis usually goes away within 2 weeks, although coughing may last 3 weeks or longer. Take over-the-counter and prescription medicines only as told by your health care provider. Drink enough fluid to keep your urine pale yellow. Contact a health care provider if your symptoms do not improve after 2 weeks of treatment. Get help right away if you cough up blood, faint, or have chest pain or shortness of breath. This information is not intended to replace advice given to you by your health care provider. Make sure you discuss any questions you have with your healthcare provider. Document Revised: 01/20/2020 Document Reviewed: 09/12/2018 Elsevier Patient Education  [place].

## 2020-09-02 ENCOUNTER — Other Ambulatory Visit (HOSPITAL_COMMUNITY): Payer: Self-pay

## 2020-09-02 MED ORDER — CARESTART COVID-19 HOME TEST VI KIT
PACK | 0 refills | Status: DC
  Filled 2020-09-02: qty 4, 4d supply, fill #0

## 2020-11-04 ENCOUNTER — Other Ambulatory Visit: Payer: Self-pay

## 2020-11-04 ENCOUNTER — Ambulatory Visit (INDEPENDENT_AMBULATORY_CARE_PROVIDER_SITE_OTHER): Payer: 59

## 2020-11-04 ENCOUNTER — Ambulatory Visit: Payer: 59

## 2020-11-04 DIAGNOSIS — Z23 Encounter for immunization: Secondary | ICD-10-CM

## 2020-11-04 NOTE — Progress Notes (Signed)
Pt came into the office to receive his Shingrix #2 and his flu shot. He tolerated the injections well. No questions or concerns at this time.

## 2020-11-09 ENCOUNTER — Other Ambulatory Visit (HOSPITAL_COMMUNITY): Payer: Self-pay

## 2020-11-09 ENCOUNTER — Telehealth: Payer: 59 | Admitting: Physician Assistant

## 2020-11-09 DIAGNOSIS — J069 Acute upper respiratory infection, unspecified: Secondary | ICD-10-CM | POA: Diagnosis not present

## 2020-11-09 MED ORDER — BENZONATATE 100 MG PO CAPS
100.0000 mg | ORAL_CAPSULE | Freq: Three times a day (TID) | ORAL | 0 refills | Status: DC | PRN
  Filled 2020-11-09: qty 20, 7d supply, fill #0

## 2020-11-09 MED ORDER — FLUTICASONE PROPIONATE 50 MCG/ACT NA SUSP
2.0000 | Freq: Every day | NASAL | 0 refills | Status: DC
Start: 2020-11-09 — End: 2021-10-04
  Filled 2020-11-09: qty 16, 30d supply, fill #0

## 2020-11-09 MED ORDER — PREDNISONE 20 MG PO TABS
40.0000 mg | ORAL_TABLET | Freq: Every day | ORAL | 0 refills | Status: DC
Start: 2020-11-09 — End: 2021-07-07
  Filled 2020-11-09: qty 10, 5d supply, fill #0

## 2020-11-09 MED ORDER — ALBUTEROL SULFATE HFA 108 (90 BASE) MCG/ACT IN AERS
2.0000 | INHALATION_SPRAY | RESPIRATORY_TRACT | 0 refills | Status: DC | PRN
  Filled 2020-11-09: qty 18, 10d supply, fill #0

## 2020-11-09 MED ORDER — AEROCHAMBER PLUS FLO-VU MEDIUM MISC
1.0000 | Freq: Once | 0 refills | Status: AC
  Filled 2020-11-09: qty 1, 1d supply, fill #0

## 2020-11-09 MED ORDER — DOXYCYCLINE HYCLATE 100 MG PO CAPS
100.0000 mg | ORAL_CAPSULE | Freq: Two times a day (BID) | ORAL | 0 refills | Status: DC
  Filled 2020-11-09: qty 20, 10d supply, fill #0

## 2020-11-09 NOTE — Patient Instructions (Signed)
1. Viral upper respiratory tract infection  - Albuterol and spacer 2 puffs every 2 hours as needed for wheezing, persistent cough  - doxycycline 2x per day - wear sunscreen while taking this medication  - Prednisone 1x per day for 5 days  - Tessalon - as needed for cough  - OTC Zyrtec 1x per day  - Mucinex 1279m 2x per day (without decongestant) for nasal congestion  - flonase 1-2x per day while nasal congestion is present  - avoid sudafed, afrin and other decongestants as they can worsen a sinus infection  - Follow-up with PCP as needed  - Follow-up with Urgent Care or ER if symptoms worsen.

## 2020-11-09 NOTE — Progress Notes (Signed)
Mr. Sean Cochran, Sean Cochran are scheduled for a virtual visit with your provider today.    Just as we do with appointments in the office, we must obtain your consent to participate.  Your consent will be active for this visit and any virtual visit you may have with one of our providers in the next 365 days.    If you have a MyChart account, I can also send a copy of this consent to you electronically.  All virtual visits are billed to your insurance company just like a traditional visit in the office.  As this is a virtual visit, video technology does not allow for your provider to perform a traditional examination.  This may limit your provider's ability to fully assess your condition.  If your provider identifies any concerns that need to be evaluated in person or the need to arrange testing such as labs, EKG, etc, we will make arrangements to do so.    Although advances in technology are sophisticated, we cannot ensure that it will always work on either your end or our end.  If the connection with a video visit is poor, we may have to switch to a telephone visit.  With either a video or telephone visit, we are not always able to ensure that we have a secure connection.   I need to obtain your verbal consent now.   Are you willing to proceed with your visit today?   Sean Cochran has provided verbal consent on 11/09/2020 for a virtual visit (video or telephone).   Abigail Butts, PA-C 11/09/2020  4:46 PM   Date:  11/09/2020   ID:  Sean Cochran, DOB April 19, 1960, MRN [id number]  Patient Location: Home Provider Location: Home Office   Participants: Patient and Provider for Visit and Wrap up  Method of visit: Video  Location of Patient: Home Location of Provider: Home Office Consent was obtain for visit over the video. Services rendered by provider: Visit was performed via video  A video enabled telemedicine application was used and I verified that I am speaking with the correct person using two  identifiers.  PCP:  Janith Lima, MD   Chief Complaint:  cough  History of Present Illness:    Sean Cochran is a 60 y.o. male with history as stated below. Presents video telehealth for an acute care visit.  Pt reports persistent and gradually worsening nasal congestion and cough onset Saturday night.  Pt reports worsening productivity of the cough in the last 24 hours.  Pt reports he has been taking sudafed without significant relief.   Pt is not a smoker.  Pt reports hx of bronchitis and asthma as a child. Pt reports he has used prednisone in the past.   Pt does have an albuterol inhaler, but it is expired. Is not currently using allergy medication or nasal steroid.    Pt tested for COVID this afternoon and was negative. No know COVID exposures.  No other aggravating or relieving factors.  No other c/o.  Past Medical, Surgical, Social History, Allergies, and Medications have been Reviewed.  Patient Active Problem List   Diagnosis Date Noted   Sinusitis 06/20/2020   Allergic rhinitis 06/20/2020   Peroneal tendinitis of right lower extremity 12/29/2019   Bilateral hip pain 17/61/6073   Nonalcoholic steatohepatitis (NASH) 06/17/2018   Eczema 12/31/2016   Routine general medical examination at a health care facility 06/26/2016   Abnormal finding on MRI of brain 10/04/2014   Hyperlipidemia LDL goal <160  05/08/2013    Social History   Tobacco Use   Smoking status: Never   Smokeless tobacco: Never  Substance Use Topics   Alcohol use: Yes    Alcohol/week: 3.0 standard drinks    Types: 3 Glasses of wine per week     Current Outpatient Medications:    albuterol (VENTOLIN HFA) 108 (90 Base) MCG/ACT inhaler, Inhale 2 puffs into the lungs every 2 (two) hours as needed for wheezing or shortness of breath (cough)., Disp: 8 g, Rfl: 0   benzonatate (TESSALON PERLES) 100 MG capsule, Take 1 capsule (100 mg total) by mouth 3 (three) times daily as needed for cough (cough).,  Disp: 20 capsule, Rfl: 0   doxycycline (VIBRAMYCIN) 100 MG capsule, Take 1 capsule (100 mg total) by mouth 2 (two) times daily., Disp: 20 capsule, Rfl: 0   fluticasone (FLONASE) 50 MCG/ACT nasal spray, Place 2 sprays into both nostrils daily., Disp: 9.9 g, Rfl: 0   predniSONE (DELTASONE) 20 MG tablet, Take 2 tablets (40 mg total) by mouth daily., Disp: 10 tablet, Rfl: 0   Spacer/Aero-Holding Chambers (AEROCHAMBER PLUS FLO-VU MEDIUM) MISC, 1 each by Other route once for 1 dose., Disp: 1 each, Rfl: 0   azithromycin (ZITHROMAX) 250 MG tablet, Take 2 tablets by mouth on day 1 then 1 tablet daily thereafter until completed., Disp: 6 tablet, Rfl: 0   cetirizine (ZYRTEC) 10 MG tablet, Take 1 tablet (10 mg total) by mouth daily., Disp: 30 tablet, Rfl: 11   COVID-19 At Home Antigen Test (CARESTART COVID-19 HOME TEST) KIT, Use as directed, Disp: 4 each, Rfl: 0   finasteride (PROPECIA) 1 MG tablet, Take 1 mg by mouth daily., Disp: , Rfl:    finasteride (PROPECIA) 1 MG tablet, Take 1 tablet (1 mg total) by mouth daily., Disp: 90 tablet, Rfl: 3   finasteride (PROPECIA) 1 MG tablet, TAKE 1 TABLET BY MOUTH ONCE DAILY., Disp: 90 tablet, Rfl: 3   meloxicam (MOBIC) 15 MG tablet, Take 1 tablet (15 mg total) by mouth daily as needed., Disp: 30 tablet, Rfl: 1   sildenafil (REVATIO) 20 MG tablet, TAKE 2 TO 5 TABLETS BY MOUTH DAILY AS NEEDED., Disp: 50 tablet, Rfl: 3   No Known Allergies   Review of Systems  Constitutional:  Negative for chills and fever.  HENT:  Positive for congestion and sore throat. Negative for ear pain.   Eyes:  Negative for blurred vision and double vision.  Respiratory:  Positive for cough and wheezing. Negative for shortness of breath.   Cardiovascular:  Negative for chest pain, palpitations and leg swelling.  Gastrointestinal:  Negative for abdominal pain, diarrhea, nausea and vomiting.  Genitourinary:  Negative for dysuria.  Musculoskeletal:  Negative for myalgias.  Skin:  Negative for  rash.  Neurological:  Negative for loss of consciousness, weakness and headaches.  Psychiatric/Behavioral:  The patient is not nervous/anxious.   See HPI for history of present illness.  Physical Exam Constitutional:      General: He is not in acute distress.    Appearance: Normal appearance. He is well-developed. He is not ill-appearing.  HENT:     Head: Normocephalic and atraumatic.     Nose: Congestion present.  Eyes:     General: No scleral icterus.    Conjunctiva/sclera: Conjunctivae normal.  Pulmonary:     Effort: Pulmonary effort is normal.     Comments: Speaks in full sentences Congested cough Musculoskeletal:        General: Normal range of motion.  Cervical back: Normal range of motion.  Skin:    Coloration: Skin is not pale.  Neurological:     General: No focal deficit present.     Mental Status: He is alert.  Psychiatric:        Mood and Affect: Mood normal.              A&P  1. Viral upper respiratory tract infection  - Albuterol and spacer 2 puffs every 2 hours as needed for wheezing, persistent cough  - doxycycline 2x per day - wear sunscreen while taking this medication  - Prednisone 1x per day for 5 days  - Tessalon - as needed for cough  - OTC Zyrtec 1x per day  - Mucinex 1234m 2x per day (without decongestant) for nasal congestion  - flonase 1-2x per day while nasal congestion is present  - avoid sudafed, afrin and other decongestants as they can worsen a sinus infection  - Follow-up with PCP as needed  - Follow-up with Urgent Care or ER if symptoms worsen.   Patient voiced understanding and agreement to plan.   Time:   Today, I have spent 15 minutes with the patient with telehealth technology discussing the above problems, reviewing the chart, previous notes, medications and orders.    Tests Ordered: No orders of the defined types were placed in this encounter.   Medication Changes: Meds ordered this encounter  Medications    albuterol (VENTOLIN HFA) 108 (90 Base) MCG/ACT inhaler    Sig: Inhale 2 puffs into the lungs every 2 (two) hours as needed for wheezing or shortness of breath (cough).    Dispense:  8 g    Refill:  0   Spacer/Aero-Holding Chambers (AEROCHAMBER PLUS FLO-VU MEDIUM) MISC    Sig: 1 each by Other route once for 1 dose.    Dispense:  1 each    Refill:  0   doxycycline (VIBRAMYCIN) 100 MG capsule    Sig: Take 1 capsule (100 mg total) by mouth 2 (two) times daily.    Dispense:  20 capsule    Refill:  0   predniSONE (DELTASONE) 20 MG tablet    Sig: Take 2 tablets (40 mg total) by mouth daily.    Dispense:  10 tablet    Refill:  0   benzonatate (TESSALON PERLES) 100 MG capsule    Sig: Take 1 capsule (100 mg total) by mouth 3 (three) times daily as needed for cough (cough).    Dispense:  20 capsule    Refill:  0   fluticasone (FLONASE) 50 MCG/ACT nasal spray    Sig: Place 2 sprays into both nostrils daily.    Dispense:  9.9 g    Refill:  0     Disposition:  Follow up PCP as needed  Signed, HAbigail Butts PA-C  11/09/2020 4:46 PM

## 2020-11-28 ENCOUNTER — Other Ambulatory Visit (HOSPITAL_COMMUNITY): Payer: Self-pay

## 2020-12-19 DIAGNOSIS — R351 Nocturia: Secondary | ICD-10-CM | POA: Diagnosis not present

## 2020-12-19 DIAGNOSIS — E291 Testicular hypofunction: Secondary | ICD-10-CM | POA: Diagnosis not present

## 2020-12-19 DIAGNOSIS — N401 Enlarged prostate with lower urinary tract symptoms: Secondary | ICD-10-CM | POA: Diagnosis not present

## 2021-02-07 ENCOUNTER — Other Ambulatory Visit (HOSPITAL_COMMUNITY): Payer: Self-pay

## 2021-02-07 DIAGNOSIS — N5201 Erectile dysfunction due to arterial insufficiency: Secondary | ICD-10-CM | POA: Diagnosis not present

## 2021-02-07 DIAGNOSIS — N451 Epididymitis: Secondary | ICD-10-CM | POA: Diagnosis not present

## 2021-02-07 DIAGNOSIS — Z125 Encounter for screening for malignant neoplasm of prostate: Secondary | ICD-10-CM | POA: Diagnosis not present

## 2021-02-07 MED ORDER — MELOXICAM 15 MG PO TABS
ORAL_TABLET | ORAL | 1 refills | Status: DC
  Filled 2021-02-07: qty 30, 30d supply, fill #0

## 2021-02-07 MED ORDER — FINASTERIDE 1 MG PO TABS
ORAL_TABLET | ORAL | 3 refills | Status: DC
  Filled 2021-02-07: qty 90, 90d supply, fill #0
  Filled 2021-05-22: qty 90, 90d supply, fill #1
  Filled 2021-08-17: qty 90, 90d supply, fill #2
  Filled 2021-11-20: qty 90, 90d supply, fill #3

## 2021-02-08 ENCOUNTER — Other Ambulatory Visit (HOSPITAL_COMMUNITY): Payer: Self-pay

## 2021-02-23 DIAGNOSIS — H5213 Myopia, bilateral: Secondary | ICD-10-CM | POA: Diagnosis not present

## 2021-03-08 ENCOUNTER — Other Ambulatory Visit: Payer: Self-pay

## 2021-04-04 ENCOUNTER — Other Ambulatory Visit (HOSPITAL_COMMUNITY): Payer: Self-pay

## 2021-04-04 DIAGNOSIS — N451 Epididymitis: Secondary | ICD-10-CM | POA: Diagnosis not present

## 2021-04-04 DIAGNOSIS — R102 Pelvic and perineal pain: Secondary | ICD-10-CM | POA: Diagnosis not present

## 2021-04-04 DIAGNOSIS — N50812 Left testicular pain: Secondary | ICD-10-CM | POA: Diagnosis not present

## 2021-04-04 MED ORDER — CYCLOBENZAPRINE HCL ER 15 MG PO CP24
ORAL_CAPSULE | ORAL | 0 refills | Status: DC
  Filled 2021-04-04: qty 20, 20d supply, fill #0

## 2021-04-05 ENCOUNTER — Other Ambulatory Visit (HOSPITAL_COMMUNITY): Payer: Self-pay

## 2021-04-06 ENCOUNTER — Other Ambulatory Visit (HOSPITAL_COMMUNITY): Payer: Self-pay

## 2021-04-07 ENCOUNTER — Other Ambulatory Visit (HOSPITAL_COMMUNITY): Payer: Self-pay

## 2021-04-07 MED ORDER — CYCLOBENZAPRINE HCL 10 MG PO TABS
ORAL_TABLET | ORAL | 0 refills | Status: DC
  Filled 2021-04-07: qty 20, 7d supply, fill #0

## 2021-04-11 ENCOUNTER — Other Ambulatory Visit: Payer: Self-pay | Admitting: Urology

## 2021-04-11 DIAGNOSIS — R102 Pelvic and perineal pain: Secondary | ICD-10-CM

## 2021-04-11 DIAGNOSIS — N50812 Left testicular pain: Secondary | ICD-10-CM

## 2021-04-13 ENCOUNTER — Other Ambulatory Visit (HOSPITAL_COMMUNITY): Payer: Self-pay

## 2021-04-23 ENCOUNTER — Other Ambulatory Visit: Payer: 59

## 2021-04-25 ENCOUNTER — Other Ambulatory Visit: Payer: Self-pay

## 2021-04-25 ENCOUNTER — Other Ambulatory Visit (HOSPITAL_COMMUNITY): Payer: Self-pay

## 2021-04-25 ENCOUNTER — Ambulatory Visit: Payer: 59 | Admitting: Sports Medicine

## 2021-04-25 DIAGNOSIS — M7671 Peroneal tendinitis, right leg: Secondary | ICD-10-CM | POA: Diagnosis not present

## 2021-04-25 DIAGNOSIS — M7712 Lateral epicondylitis, left elbow: Secondary | ICD-10-CM

## 2021-04-25 MED ORDER — MELOXICAM 15 MG PO TABS
ORAL_TABLET | ORAL | 0 refills | Status: AC
  Filled 2021-04-25: qty 40, 40d supply, fill #0

## 2021-04-25 MED ORDER — NITROGLYCERIN 0.2 MG/HR TD PT24
MEDICATED_PATCH | TRANSDERMAL | 1 refills | Status: DC
Start: 2021-04-25 — End: 2021-10-04
  Filled 2021-04-25: qty 12, 48d supply, fill #0
  Filled 2021-06-12: qty 12, 48d supply, fill #1

## 2021-04-25 NOTE — Patient Instructions (Signed)

## 2021-04-26 NOTE — Progress Notes (Signed)
° °  Subjective:    Patient ID: Sean Cochran, male    DOB: 1960/06/12, 61 y.o.   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  Pain initially began along the lateral aspect of the elbow but he will get radiating pain now into the posterior and medial aspect of the elbow on occasion.  His symptoms are not constantly present.  They are intermittent but are quite uncomfortable when present.  This pain will also radiate into the dorsal forearm.  His wife was diagnosed with tennis elbow a while back so he has been doing the same exercises that were provided to her.  He has a copy of those available for my review.  He denies any problems with this elbow in the past.  He recently stopped playing tennis to allow his elbow to rest.  Past medical history reviewed Medications reviewed Allergies reviewed    Review of Systems As above    Objective:   Physical Exam  Developed, fit appearing.  No acute distress  Left elbow: Full range of motion.  No effusion.  No soft tissue swelling.  There is slight tenderness to palpation over the lateral epicondyle but reproducible pain with resisted ECRB testing.  No tenderness over the medial epicondyle.  Good strength.  Pulses distally.  Limited MSK ultrasound of the lateral left elbow does show a small area of hypoechoic change in the deep fibers of the proximal common extensor tendon.  Small calcification within the tendon is also seen.  Findings are consistent with lateral epicondylitis.      Assessment & Plan:   Left elbow pain secondary to lateral epicondylitis  I reviewed his home exercise program and it is appropriate.  I would like for him to perform these exercises daily.  We will also try a course of meloxicam 15 mg daily for 7 days then as needed.  Alternatively, he may use Voltaren gel if he finds it to be more  effective.  I also recommended that he try either a compression sleeve or a counterforce brace with activity.  Since a small tear was seen on ultrasound we will also start a topical nitroglycerin patch protocol with instructions to utilize a quarter patch daily.  He will follow-up with me again in 4 weeks for reevaluation.  In the meantime, I have asked him to consult a tennis pro about his tennis racquet to see whether or not he needs to have the tension on his strings were his grip adjusted.   This note was dictated using Dragon naturally speaking software and may contain errors in syntax, spelling, or content which have not been identified prior to signing this note.

## 2021-04-27 ENCOUNTER — Ambulatory Visit
Admission: RE | Admit: 2021-04-27 | Discharge: 2021-04-27 | Disposition: A | Discharge status: Home / Self Care | Payer: 59 | Source: Ambulatory Visit | Attending: Urology | Admitting: Urology

## 2021-04-27 ENCOUNTER — Other Ambulatory Visit: Payer: Self-pay

## 2021-04-27 ENCOUNTER — Other Ambulatory Visit (HOSPITAL_COMMUNITY): Payer: Self-pay

## 2021-04-27 DIAGNOSIS — M47816 Spondylosis without myelopathy or radiculopathy, lumbar region: Secondary | ICD-10-CM | POA: Diagnosis not present

## 2021-04-27 DIAGNOSIS — N50812 Left testicular pain: Secondary | ICD-10-CM

## 2021-04-27 DIAGNOSIS — R102 Pelvic and perineal pain: Secondary | ICD-10-CM

## 2021-04-27 DIAGNOSIS — M1612 Unilateral primary osteoarthritis, left hip: Secondary | ICD-10-CM | POA: Diagnosis not present

## 2021-04-27 DIAGNOSIS — M48061 Spinal stenosis, lumbar region without neurogenic claudication: Secondary | ICD-10-CM | POA: Diagnosis not present

## 2021-04-27 MED ORDER — GADOBENATE DIMEGLUMINE 529 MG/ML IV SOLN
16.0000 mL | Freq: Once | INTRAVENOUS | Status: AC | PRN
  Administered 2021-04-27: 16 mL via INTRAVENOUS

## 2021-05-23 ENCOUNTER — Ambulatory Visit: Payer: 59 | Admitting: Sports Medicine

## 2021-05-23 ENCOUNTER — Other Ambulatory Visit (HOSPITAL_COMMUNITY): Payer: Self-pay

## 2021-05-23 VITALS — BP 122/72 | Ht 71.0 in | Wt 175.0 lb

## 2021-05-23 DIAGNOSIS — M7712 Lateral epicondylitis, left elbow: Secondary | ICD-10-CM

## 2021-05-24 ENCOUNTER — Other Ambulatory Visit (HOSPITAL_COMMUNITY): Payer: Self-pay

## 2021-05-24 NOTE — Progress Notes (Addendum)
? ?  Subjective:  ? ? Patient ID: Sean Cochran, male    DOB: [birth year], 61 y.o.   MRN: [id number] ? ?HPI ? ?Sean Cochran presents today for follow-up on left elbow pain secondary to lateral epicondylitis.  He is improving.  He is doing well with his home exercises (he did have a few questions regarding the exercises) and his topical nitroglycerin patch.  Since his last office visit he has had his tennis racquet rhe strong.  He was also able to play tennis with minimal discomfort.  He is also using a counterforce brace but may want to try a compression sleeve as well. ? ? ?Review of Systems ?As above ?   ?Objective:  ? Physical Exam ? ?Well-developed, well-nourished.  No acute distress ? ?Left elbow: Full range of motion.  No effusion.  No soft tissue swelling.  Minimal tenderness to palpation over the lateral epicondyle with minimal pain with ECRB testing.  Good grip strength distally.  Good pulses distally. ? ? ?   ?Assessment & Plan:  ? ?Improving left elbow pain secondary to lateral epicondylitis ? ?I recommended the patient continue on his topical nitroglycerin patches for 4 more weeks.  He may then discontinue them at that time.  He will also continue with his home exercises.  I did discuss the possibility of a referral to formal physical therapy if his improvement plateaus Barbaraann Barthel).  He will follow-up with me for ongoing or recalcitrant issues. ? ?This note was dictated using Dragon naturally speaking software and may contain errors in syntax, spelling, or content which have not been identified prior to signing this note.  ?

## 2021-06-12 ENCOUNTER — Ambulatory Visit: Payer: 59 | Admitting: Sports Medicine

## 2021-06-12 ENCOUNTER — Other Ambulatory Visit (HOSPITAL_COMMUNITY): Payer: Self-pay

## 2021-06-12 VITALS — BP 110/74 | Ht 71.0 in | Wt 175.0 lb

## 2021-06-12 DIAGNOSIS — M7522 Bicipital tendinitis, left shoulder: Secondary | ICD-10-CM | POA: Diagnosis not present

## 2021-06-12 DIAGNOSIS — M7712 Lateral epicondylitis, left elbow: Secondary | ICD-10-CM

## 2021-06-12 MED ORDER — MELOXICAM 15 MG PO TABS
15.0000 mg | ORAL_TABLET | Freq: Every day | ORAL | 0 refills | Status: DC | PRN
  Filled 2021-06-12: qty 40, 40d supply, fill #0

## 2021-06-12 NOTE — Progress Notes (Signed)
? ?  Subjective:  ? ? Patient ID: Sean Cochran, male    DOB: [birth year], 61 y.o.   MRN: [id number] ? ?HPI ? ?Sean Cochran presents today with some persistent left elbow pain.  The pain related to his lateral epicondylitis is about the same but he has began to experience some pain in the antecubital fossa recently.  No known trauma.  He does have a history of a left biceps tendon repair after biceps tendon rupture in 2014.  This was performed by Dr. Mardelle Matte.  He has done well since then.  Pain in the antecubital fossa will occasionally radiate up into the biceps.  He continues to perform his home exercises for his lateral epicondylitis. ? ? ? ?Review of Systems ?As above ?   ?Objective:  ? Physical Exam ? ?Well-developed, well-nourished.  No acute distress ? ?Left elbow: Full range of motion.  No effusion.  No soft tissue swelling.  Slight tenderness to palpation at the lateral epicondyles with mild pain with resisted ECRB testing.  Slight tenderness to palpation in the antecubital fossa at the biceps tendon with reproducible pain with resisted supination.  Biceps tendon is palpable on hook testing.  No swelling.  No ecchymosis.  Good strength.  Good pulses distally. ? ? ?   ?Assessment & Plan:  ? ?Left elbow biceps tendinitis ?Lateral epicondylitis, left elbow ?Status post remote biceps tendon rupture, left elbow ? ?I am going to refill [facility] with instructions to take 15 mg daily for the next few days.  I would like for him to lay off those home exercises for his lateral epicondylitis that reproduce pain such as exercises involving supination.  I think he is okay to continue playing tennis using pain as his guide.  I explained to him that it would be quite unusual for him to rupture his biceps tendon simply playing tennis.  He has a prescription for Barbaraann Barthel if symptoms do not improve.  Follow-up for ongoing or recalcitrant issues. ? ?This note was dictated using Dragon naturally speaking software and may  contain errors in syntax, spelling, or content which have not been identified prior to signing this note.  ? ?

## 2021-07-04 DIAGNOSIS — M5416 Radiculopathy, lumbar region: Secondary | ICD-10-CM | POA: Diagnosis not present

## 2021-07-05 ENCOUNTER — Telehealth: Payer: Self-pay | Admitting: Internal Medicine

## 2021-07-05 NOTE — Telephone Encounter (Signed)
Pt called in and is requesting to have lab work completed, checking liver enzymes and CBC before physical appt scheduled for next month.  ? ?Please let pt know when orders are placed.  ?

## 2021-07-05 NOTE — Telephone Encounter (Signed)
Pt also requesting lipid panel be checked prior to appointment.  ?

## 2021-07-07 ENCOUNTER — Other Ambulatory Visit (INDEPENDENT_AMBULATORY_CARE_PROVIDER_SITE_OTHER): Payer: 59

## 2021-07-07 ENCOUNTER — Other Ambulatory Visit: Payer: Self-pay | Admitting: Internal Medicine

## 2021-07-07 DIAGNOSIS — Z125 Encounter for screening for malignant neoplasm of prostate: Secondary | ICD-10-CM | POA: Diagnosis not present

## 2021-07-07 DIAGNOSIS — K7581 Nonalcoholic steatohepatitis (NASH): Secondary | ICD-10-CM

## 2021-07-07 DIAGNOSIS — E785 Hyperlipidemia, unspecified: Secondary | ICD-10-CM

## 2021-07-07 DIAGNOSIS — Z0000 Encounter for general adult medical examination without abnormal findings: Secondary | ICD-10-CM

## 2021-07-07 LAB — BASIC METABOLIC PANEL
BUN: 15 mg/dL (ref 6–23)
CO2: 31 mEq/L (ref 19–32)
Calcium: 9.2 mg/dL (ref 8.4–10.5)
Chloride: 98 mEq/L (ref 96–112)
Creatinine, Ser: 0.95 mg/dL (ref 0.40–1.50)
GFR: 87.45 mL/min (ref >60.00)
Glucose, Bld: 82 mg/dL (ref 70–99)
Potassium: 4.2 mEq/L (ref 3.5–5.1)
Sodium: 136 mEq/L (ref 135–145)

## 2021-07-07 LAB — CBC WITH DIFFERENTIAL/PLATELET
Basophils Absolute: 0.1 10*3/uL (ref 0.0–0.1)
Basophils Relative: 2.1 % (ref 0.0–3.0)
Eosinophils Absolute: 0.5 10*3/uL (ref 0.0–0.7)
Eosinophils Relative: 8.3 % — ABNORMAL HIGH (ref 0.0–5.0)
HCT: 41.2 % (ref 39.0–52.0)
Hemoglobin: 14.2 g/dL (ref 13.0–17.0)
Lymphocytes Relative: 21.2 % (ref 12.0–46.0)
Lymphs Abs: 1.2 10*3/uL (ref 0.7–4.0)
MCHC: 34.4 g/dL (ref 30.0–36.0)
MCV: 89.3 fl (ref 78.0–100.0)
Monocytes Absolute: 0.6 10*3/uL (ref 0.1–1.0)
Monocytes Relative: 9.9 % (ref 3.0–12.0)
Neutro Abs: 3.3 10*3/uL (ref 1.4–7.7)
Neutrophils Relative %: 58.5 % (ref 43.0–77.0)
Platelets: 204 10*3/uL (ref 150.0–400.0)
RBC: 4.61 Mil/uL (ref 4.22–5.81)
RDW: 13.6 % (ref 11.5–15.5)
WBC: 5.7 10*3/uL (ref 4.0–10.5)

## 2021-07-07 LAB — HEPATIC FUNCTION PANEL
ALT: 29 U/L (ref 0–53)
AST: 22 U/L (ref 0–37)
Albumin: 4.6 g/dL (ref 3.5–5.2)
Alkaline Phosphatase: 57 U/L (ref 39–117)
Bilirubin, Direct: 0.1 mg/dL (ref 0.0–0.3)
Total Bilirubin: 0.6 mg/dL (ref 0.2–1.2)
Total Protein: 6.5 g/dL (ref 6.0–8.3)

## 2021-07-07 LAB — LIPID PANEL
Cholesterol: 225 mg/dL — ABNORMAL HIGH (ref 0–200)
HDL: 40.8 mg/dL (ref >39.00)
NonHDL: 183.89
Total CHOL/HDL Ratio: 6
Triglycerides: 226 mg/dL — ABNORMAL HIGH (ref 0.0–149.0)
VLDL: 45.2 mg/dL — ABNORMAL HIGH (ref 0.0–40.0)

## 2021-07-07 LAB — LDL CHOLESTEROL, DIRECT: Direct LDL: 148 mg/dL

## 2021-07-07 LAB — TSH: TSH: 2.73 u[IU]/mL (ref 0.35–5.50)

## 2021-07-07 LAB — PROTIME-INR
INR: 1 ratio (ref 0.8–1.0)
Prothrombin Time: 11.3 s (ref 9.6–13.1)

## 2021-07-07 LAB — PSA: PSA: 0.12 ng/mL (ref 0.10–4.00)

## 2021-07-07 NOTE — Telephone Encounter (Signed)
Pt informed that labs are entered.  ?

## 2021-07-09 ENCOUNTER — Encounter: Payer: Self-pay | Admitting: Sports Medicine

## 2021-07-10 ENCOUNTER — Other Ambulatory Visit: Payer: Self-pay

## 2021-07-10 DIAGNOSIS — M7712 Lateral epicondylitis, left elbow: Secondary | ICD-10-CM

## 2021-07-11 ENCOUNTER — Ambulatory Visit: Payer: 59 | Admitting: Sports Medicine

## 2021-07-12 ENCOUNTER — Ambulatory Visit (HOSPITAL_COMMUNITY)
Admission: RE | Admit: 2021-07-12 | Discharge: 2021-07-12 | Disposition: A | Discharge status: Home / Self Care | Payer: 59 | Source: Ambulatory Visit | Attending: Sports Medicine | Admitting: Sports Medicine

## 2021-07-12 ENCOUNTER — Other Ambulatory Visit (HOSPITAL_COMMUNITY): Payer: 59

## 2021-07-12 DIAGNOSIS — M7712 Lateral epicondylitis, left elbow: Secondary | ICD-10-CM | POA: Insufficient documentation

## 2021-07-12 DIAGNOSIS — S56512A Strain of other extensor muscle, fascia and tendon at forearm level, left arm, initial encounter: Secondary | ICD-10-CM | POA: Diagnosis not present

## 2021-07-12 DIAGNOSIS — M25522 Pain in left elbow: Secondary | ICD-10-CM | POA: Diagnosis not present

## 2021-07-14 ENCOUNTER — Encounter: Payer: Self-pay | Admitting: Internal Medicine

## 2021-07-14 ENCOUNTER — Ambulatory Visit (INDEPENDENT_AMBULATORY_CARE_PROVIDER_SITE_OTHER): Payer: 59 | Admitting: Internal Medicine

## 2021-07-14 ENCOUNTER — Other Ambulatory Visit (HOSPITAL_COMMUNITY): Payer: Self-pay

## 2021-07-14 DIAGNOSIS — L259 Unspecified contact dermatitis, unspecified cause: Secondary | ICD-10-CM | POA: Insufficient documentation

## 2021-07-14 MED ORDER — PREDNISONE 10 MG PO TABS
ORAL_TABLET | ORAL | 0 refills | Status: AC
  Filled 2021-07-14: qty 18, 9d supply, fill #0

## 2021-07-14 MED ORDER — TRIAMCINOLONE ACETONIDE 0.5 % EX CREA
1.0000 "application " | TOPICAL_CREAM | Freq: Three times a day (TID) | CUTANEOUS | 0 refills | Status: DC
  Filled 2021-07-14: qty 30, 10d supply, fill #0

## 2021-07-14 MED ORDER — METHYLPREDNISOLONE ACETATE 80 MG/ML IJ SUSP
80.0000 mg | Freq: Once | INTRAMUSCULAR | Status: AC
  Administered 2021-07-14: 80 mg via INTRAMUSCULAR

## 2021-07-14 NOTE — Progress Notes (Signed)
Patient ID: Sean Cochran, male   DOB: January 27, 1961, 61 y.o.   MRN: [id number] ? ? ? ?    Chief Complaint: follow up spreading rash ? ?     HPI:  Sean Cochran is a 61 y.o. male here with c/o initial rash to forearms left > right x 2 wks after working in the yard; but despite showers and soap, and otc cortisone , the rash has been spreading some up the arms and also to the abdomen both sides as well, with itching discomfort.  No fever drainage, Pt denies chest pain, increased sob or doe, wheezing, orthopnea, PND, increased LE swelling, palpitations, dizziness or syncope.   Pt denies polydipsia, polyuria, or new focal neuro s/s.   ?      ?Wt Readings from Last 3 Encounters:  ?07/14/21 183 lb (83 kg)  ?06/12/21 175 lb (79.4 kg)  ?05/23/21 175 lb (79.4 kg)  ? ?BP Readings from Last 3 Encounters:  ?07/14/21 134/66  ?06/12/21 110/74  ?05/23/21 122/72  ? ?      ?Past Medical History:  ?Diagnosis Date  ? Abnormal finding on MRI of brain 10/04/2014  ? Back pain   ? Colon polyp 02/19/2013  ? Dr. Collene Mares - Repeat in 5 years  ? Constipation   ? Hip pain   ? History of pineal cyst   ? Hyperlipidemia   ? Memory loss   ? ?Past Surgical History:  ?Procedure Laterality Date  ? BICEPS TENDON REPAIR Left   ? COLONOSCOPY    ? ROTATOR CUFF REPAIR Right   ? TONSILLECTOMY    ? VASECTOMY    ? ? reports that he has never smoked. He has never used smokeless tobacco. He reports current alcohol use of about 3.0 standard drinks per week. He reports that he does not use drugs. ?family history includes Cancer in his father and maternal grandfather; Depression in his sister; Healthy in his brother and brother; Heart attack in his brother; Prostate cancer in his maternal grandfather and paternal grandfather. ?No Known Allergies ?Current Outpatient Medications on File Prior to Visit  ?Medication Sig Dispense Refill  ? finasteride (PROPECIA) 1 MG tablet Take 1 tablet by mouth once a day 90 tablet 3  ? fluticasone (FLONASE) 50 MCG/ACT nasal spray Place  2 sprays into both nostrils daily. 16 g 0  ? meloxicam (MOBIC) 15 MG tablet Take 1 tablet (15 mg total) by mouth daily as needed for pain. Take with food. 40 tablet 0  ? nitroGLYCERIN (NITRODUR - DOSED IN MG/24 HR) 0.2 mg/hr patch Use 1/4 patch daily to the affected area. 30 patch 1  ? cetirizine (ZYRTEC) 10 MG tablet Take 1 tablet (10 mg total) by mouth daily. 30 tablet 11  ? ?No current facility-administered medications on file prior to visit.  ? ?     ROS:  All others reviewed and negative. ? ?Objective  ? ?     PE:  BP 134/66 (BP Location: Right Arm, Patient Position: Sitting, Cuff Size: Normal)   Pulse 69   Temp 98.8 ?F (37.1 ?C) (Oral)   Ht 5' 11"  (1.803 m)   Wt 183 lb (83 kg)   SpO2 96%   BMI 25.52 kg/m?  ? ?              Constitutional: Pt appears in NAD ?              HENT: Head: NCAT.  ?  Right Ear: External ear normal.   ?              Left Ear: External ear normal.  ?              Eyes: . Pupils are equal, round, and reactive to light. Conjunctivae and EOM are normal ?              Nose: without d/c or deformity ?              Neck: Neck supple. Gross normal ROM ?              Cardiovascular: Normal rate and regular rhythm.   ?              Pulmonary/Chest: Effort normal and breath sounds without rales or wheezing.  ?               ?              Neurological: Pt is alert. At baseline orientation, motor grossly intact ?              Skin: LE edema - none, numerous typical contact derm lesions to both arms and kissing abd lesions as well ?              Psychiatric: Pt behavior is normal without agitation  ? ?Micro: none ? ?Cardiac tracings I have personally interpreted today:  none ? ?Pertinent Radiological findings (summarize): none  ? ?Lab Results  ?Component Value Date  ? WBC 5.7 07/07/2021  ? HGB 14.2 07/07/2021  ? HCT 41.2 07/07/2021  ? PLT 204.0 07/07/2021  ? GLUCOSE 82 07/07/2021  ? CHOL 225 (H) 07/07/2021  ? TRIG 226.0 (H) 07/07/2021  ? HDL 40.80 07/07/2021  ? LDLDIRECT 148.0  07/07/2021  ? LDLCALC 166 (H) 05/05/2020  ? ALT 29 07/07/2021  ? AST 22 07/07/2021  ? NA 136 07/07/2021  ? K 4.2 07/07/2021  ? CL 98 07/07/2021  ? CREATININE 0.95 07/07/2021  ? BUN 15 07/07/2021  ? CO2 31 07/07/2021  ? TSH 2.73 07/07/2021  ? PSA 0.12 07/07/2021  ? INR 1.0 07/07/2021  ? ?Assessment/Plan:  ?Sean Cochran is a 61 y.o. White or Caucasian [1] male with  has a past medical history of Abnormal finding on MRI of brain (10/04/2014), Back pain, Colon polyp (02/19/2013), Constipation, Hip pain, History of pineal cyst, Hyperlipidemia, and Memory loss. ? ?Contact dermatitis ?Rather dramatic presentation than usual, but o/w typical lesions - for depomedrol im 80, prednisone taper, triam cr prn ? ?Followup: Return if symptoms worsen or fail to improve. ? ?Cathlean Cower, MD 07/16/2021 4:10 PM ?[place] ?[place] ?Internal Medicine ?

## 2021-07-14 NOTE — Patient Instructions (Signed)
You had the steroid shot today ? ?Please take all new medication as prescribed - the prednisone, and the cream as needed ? ?Please continue all other medications as before, and refills have been done if requested. ? ?Please have the pharmacy call with any other refills you may need. ? ?Please keep your appointments with your specialists as you may have planned ? ? ? ?

## 2021-07-16 ENCOUNTER — Encounter: Payer: Self-pay | Admitting: Internal Medicine

## 2021-07-16 NOTE — Assessment & Plan Note (Signed)
Rather dramatic presentation than usual, but o/w typical lesions - for depomedrol im 80, prednisone taper, triam cr prn ?

## 2021-07-17 ENCOUNTER — Telehealth: Payer: Self-pay | Admitting: Sports Medicine

## 2021-07-17 DIAGNOSIS — M5416 Radiculopathy, lumbar region: Secondary | ICD-10-CM | POA: Diagnosis not present

## 2021-07-17 NOTE — Telephone Encounter (Signed)
Dr. Mardelle Matte ?[facility] ?[address] ?[phone] ? ?Appt: 07/19/21 @ 2:30 pm ?

## 2021-07-17 NOTE — Telephone Encounter (Signed)
?  I spoke with Sean Cochran on the phone today after reviewing MRI findings of his left elbow.  He has intermediate to high-grade partial tearing of the common extensor tendon at the lateral epicondyle.  Based on these findings I recommended surgical consultation with Dr. Mardelle Matte to discuss further treatment.  Sean Cochran is in agreement with that plan.  I will defer further work-up and treatment to the discretion of Dr. Mardelle Matte and Sean Cochran will follow-up with me as needed. ? ?

## 2021-07-19 DIAGNOSIS — M7712 Lateral epicondylitis, left elbow: Secondary | ICD-10-CM | POA: Diagnosis not present

## 2021-08-01 DIAGNOSIS — M5416 Radiculopathy, lumbar region: Secondary | ICD-10-CM | POA: Diagnosis not present

## 2021-08-08 DIAGNOSIS — M5416 Radiculopathy, lumbar region: Secondary | ICD-10-CM | POA: Diagnosis not present

## 2021-08-10 ENCOUNTER — Ambulatory Visit (INDEPENDENT_AMBULATORY_CARE_PROVIDER_SITE_OTHER): Payer: 59 | Admitting: Internal Medicine

## 2021-08-10 ENCOUNTER — Encounter: Payer: Self-pay | Admitting: Internal Medicine

## 2021-08-10 VITALS — BP 118/78 | HR 74 | Temp 97.9°F | Resp 16 | Ht 71.0 in | Wt 177.0 lb

## 2021-08-10 DIAGNOSIS — E785 Hyperlipidemia, unspecified: Secondary | ICD-10-CM | POA: Diagnosis not present

## 2021-08-10 DIAGNOSIS — K7581 Nonalcoholic steatohepatitis (NASH): Secondary | ICD-10-CM

## 2021-08-10 DIAGNOSIS — Z0000 Encounter for general adult medical examination without abnormal findings: Secondary | ICD-10-CM | POA: Diagnosis not present

## 2021-08-10 NOTE — Progress Notes (Signed)
Subjective:  Patient ID: Sean Cochran, male    DOB: 1960/07/01  Age: 61 y.o. MRN: [id number]  CC: Annual Exam   HPI Sean Cochran presents for a CPX and f/up -  He has felt well recently and offers no complaints.  He is active and denies chest pain, shortness of breath, diaphoresis, dizziness, lightheadedness, or edema.  Outpatient Medications Prior to Visit  Medication Sig Dispense Refill   finasteride (PROPECIA) 1 MG tablet Take 1 tablet by mouth once a day 90 tablet 3   fluticasone (FLONASE) 50 MCG/ACT nasal spray Place 2 sprays into both nostrils daily. 16 g 0   meloxicam (MOBIC) 15 MG tablet Take 1 tablet (15 mg total) by mouth daily as needed for pain. Take with food. 40 tablet 0   nitroGLYCERIN (NITRODUR - DOSED IN MG/24 HR) 0.2 mg/hr patch Use 1/4 patch daily to the affected area. 30 patch 1   triamcinolone cream (KENALOG) 0.5 % Apply 1 application. topically 3 (three) times daily. 30 g 0   cetirizine (ZYRTEC) 10 MG tablet Take 1 tablet (10 mg total) by mouth daily. 30 tablet 11   No facility-administered medications prior to visit.    ROS Review of Systems  Constitutional: Negative.   HENT: Negative.    Eyes: Negative.   Respiratory:  Negative for cough and shortness of breath.   Cardiovascular:  Negative for chest pain, palpitations and leg swelling.  Gastrointestinal:  Negative for abdominal pain.  Endocrine: Negative.   Genitourinary: Negative.   Musculoskeletal:  Positive for arthralgias. Negative for myalgias.  Skin: Negative.   Neurological:  Negative for dizziness, weakness and light-headedness.  Hematological:  Negative for adenopathy. Does not bruise/bleed easily.  Psychiatric/Behavioral: Negative.    All other systems reviewed and are negative.   Objective:  BP 118/78 (BP Location: Left Arm, Patient Position: Sitting, Cuff Size: Large)   Pulse 74   Temp 97.9 F (36.6 C) (Oral)   Resp 16   Ht 5' 11"  (1.803 m)   Wt 177 lb (80.3 kg)   SpO2 98%    BMI 24.69 kg/m   BP Readings from Last 3 Encounters:  08/10/21 118/78  07/14/21 134/66  06/12/21 110/74    Wt Readings from Last 3 Encounters:  08/10/21 177 lb (80.3 kg)  07/14/21 183 lb (83 kg)  06/12/21 175 lb (79.4 kg)    Physical Exam Vitals reviewed.  HENT:     Nose: Nose normal.     Mouth/Throat:     Mouth: Mucous membranes are moist.  Eyes:     General: No scleral icterus.    Conjunctiva/sclera: Conjunctivae normal.  Cardiovascular:     Rate and Rhythm: Normal rate and regular rhythm.     Heart sounds: No murmur heard. Pulmonary:     Effort: Pulmonary effort is normal.     Breath sounds: No stridor. No wheezing, rhonchi or rales.  Abdominal:     General: Abdomen is flat.     Palpations: There is no mass.     Tenderness: There is no abdominal tenderness. There is no guarding.     Hernia: No hernia is present.  Musculoskeletal:        General: Normal range of motion.     Cervical back: Neck supple.     Right lower leg: No edema.     Left lower leg: No edema.  Lymphadenopathy:     Cervical: No cervical adenopathy.  Skin:    General: Skin is warm.  Neurological:     General: No focal deficit present.     Mental Status: He is alert.  Psychiatric:        Mood and Affect: Mood normal.        Behavior: Behavior normal.     Lab Results  Component Value Date   WBC 5.7 07/07/2021   HGB 14.2 07/07/2021   HCT 41.2 07/07/2021   PLT 204.0 07/07/2021   GLUCOSE 82 07/07/2021   CHOL 225 (H) 07/07/2021   TRIG 226.0 (H) 07/07/2021   HDL 40.80 07/07/2021   LDLDIRECT 148.0 07/07/2021   LDLCALC 166 (H) 05/05/2020   ALT 29 07/07/2021   AST 22 07/07/2021   NA 136 07/07/2021   K 4.2 07/07/2021   CL 98 07/07/2021   CREATININE 0.95 07/07/2021   BUN 15 07/07/2021   CO2 31 07/07/2021   TSH 2.73 07/07/2021   PSA 0.12 07/07/2021   INR 1.0 07/07/2021    MR ELBOW LEFT WO CONTRAST  Result Date: 07/14/2021 CLINICAL DATA:  Elbow pain, chronic, no prior imaging  chronic left elbow pain EXAM: MRI OF THE LEFT ELBOW WITHOUT CONTRAST TECHNIQUE: Multiplanar, multisequence MR imaging of the elbow was performed. No intravenous contrast was administered. COMPARISON:  None FINDINGS: TENDONS Common forearm flexor origin: Intact Common forearm extensor origin: There is intermediate to high-grade tearing of the common extensor tendon at the lateral epicondyle Biceps: Evidence of prior biceps tendon tear with prior repair, and bulky ossification at the radial tuberosity. Triceps: Intact LIGAMENTS Medial stabilizers: Intact ulnar collateral ligament. Lateral stabilizers: Intact radial collateral and lateral ulnar collateral ligaments. Cartilage: Mild ulnotrochlear and radiocapitellar chondrosis. Joint: No joint effusion or synovitis. Cubital tunnel: Normal. Bones: No evidence of acute fracture. There is artifactual increased signal on coronal T2 images in the distal humerus and proximal ulna, no corresponding signal abnormality on axial T2. Soft Tissues: Muscles are normal. No fluid collection or hematoma. IMPRESSION: Intermediate to high-grade partial tearing of the common extensor tendon at the lateral epicondyle. Prior biceps tendon repair with bulky ossification of the radial tuberosity. Intact medial and lateral elbow ligaments. Mild ulnotrochlear and radiocapitellar chondrosis. Electronically Signed   By: Maurine Simmering M.D.   On: 07/14/2021 15:27    Assessment & Plan:   [name] was seen today for annual exam.  Diagnoses and all orders for this visit:  Routine general medical examination at a health care facility- Exam completed, labs reviewed, vaccines are up-to-date, cancer screenings are up-to-date, patient education material was given.  Hyperlipidemia LDL goal <160- Statin therapy is not indicated.  Nonalcoholic steatohepatitis (NASH)- His LFTs are normal now.   I am having Sean L. Parson "[name]" maintain his cetirizine, fluticasone, finasteride, nitroGLYCERIN,  meloxicam, and triamcinolone cream.  No orders of the defined types were placed in this encounter.    Follow-up: Return in about 1 year (around 08/11/2022).  Scarlette Calico, MD

## 2021-08-10 NOTE — Patient Instructions (Signed)
Health Maintenance, Male Adopting a healthy lifestyle and getting preventive care are important in promoting health and wellness. Ask your health care provider about: The right schedule for you to have regular tests and exams. Things you can do on your own to prevent diseases and keep yourself healthy. What should I know about diet, weight, and exercise? Eat a healthy diet  Eat a diet that includes plenty of vegetables, fruits, low-fat dairy products, and lean protein. Do not eat a lot of foods that are high in solid fats, added sugars, or sodium. Maintain a healthy weight Body mass index (BMI) is a measurement that can be used to identify possible weight problems. It estimates body fat based on height and weight. Your health care provider can help determine your BMI and help you achieve or maintain a healthy weight. Get regular exercise Get regular exercise. This is one of the most important things you can do for your health. Most adults should: Exercise for at least 150 minutes each week. The exercise should increase your heart rate and make you sweat (moderate-intensity exercise). Do strengthening exercises at least twice a week. This is in addition to the moderate-intensity exercise. Spend less time sitting. Even light physical activity can be beneficial. Watch cholesterol and blood lipids Have your blood tested for lipids and cholesterol at 60 years of age, then have this test every 5 years. You may need to have your cholesterol levels checked more often if: Your lipid or cholesterol levels are high. You are older than 60 years of age. You are at high risk for heart disease. What should I know about cancer screening? Many types of cancers can be detected early and may often be prevented. Depending on your health history and family history, you may need to have cancer screening at various ages. This may include screening for: Colorectal cancer. Prostate cancer. Skin cancer. Lung  cancer. What should I know about heart disease, diabetes, and high blood pressure? Blood pressure and heart disease High blood pressure causes heart disease and increases the risk of stroke. This is more likely to develop in people who have high blood pressure readings or are overweight. Talk with your health care provider about your target blood pressure readings. Have your blood pressure checked: Every 3-5 years if you are 18-39 years of age. Every year if you are 40 years old or older. If you are between the ages of 65 and 75 and are a current or former smoker, ask your health care provider if you should have a one-time screening for abdominal aortic aneurysm (AAA). Diabetes Have regular diabetes screenings. This checks your fasting blood sugar level. Have the screening done: Once every three years after age 45 if you are at a normal weight and have a low risk for diabetes. More often and at a younger age if you are overweight or have a high risk for diabetes. What should I know about preventing infection? Hepatitis B If you have a higher risk for hepatitis B, you should be screened for this virus. Talk with your health care provider to find out if you are at risk for hepatitis B infection. Hepatitis C Blood testing is recommended for: Everyone born from 1945 through 1965. Anyone with known risk factors for hepatitis C. Sexually transmitted infections (STIs) You should be screened each year for STIs, including gonorrhea and chlamydia, if: You are sexually active and are younger than 60 years of age. You are older than 60 years of age and your   health care provider tells you that you are at risk for this type of infection. Your sexual activity has changed since you were last screened, and you are at increased risk for chlamydia or gonorrhea. Ask your health care provider if you are at risk. Ask your health care provider about whether you are at high risk for HIV. Your health care provider  may recommend a prescription medicine to help prevent HIV infection. If you choose to take medicine to prevent HIV, you should first get tested for HIV. You should then be tested every 3 months for as long as you are taking the medicine. Follow these instructions at home: Alcohol use Do not drink alcohol if your health care provider tells you not to drink. If you drink alcohol: Limit how much you have to 0-2 drinks a day. Know how much alcohol is in your drink. In the [place] one drink equals one 12 oz bottle of beer (355 mL), one 5 oz glass of wine (148 mL), or one 1 oz glass of hard liquor (44 mL). Lifestyle Do not use any products that contain nicotine or tobacco. These products include cigarettes, chewing tobacco, and vaping devices, such as e-cigarettes. If you need help quitting, ask your health care provider. Do not use street drugs. Do not share needles. Ask your health care provider for help if you need support or information about quitting drugs. General instructions Schedule regular health, dental, and eye exams. Stay current with your vaccines. Tell your health care provider if: You often feel depressed. You have ever been abused or do not feel safe at home. Summary Adopting a healthy lifestyle and getting preventive care are important in promoting health and wellness. Follow your health care provider's instructions about healthy diet, exercising, and getting tested or screened for diseases. Follow your health care provider's instructions on monitoring your cholesterol and blood pressure. This information is not intended to replace advice given to you by your health care provider. Make sure you discuss any questions you have with your health care provider. Document Revised: 07/11/2020 Document Reviewed: 07/11/2020 Elsevier Patient Education  2023 Elsevier Inc[employer]  

## 2021-08-15 DIAGNOSIS — M5416 Radiculopathy, lumbar region: Secondary | ICD-10-CM | POA: Diagnosis not present

## 2021-08-22 ENCOUNTER — Other Ambulatory Visit (HOSPITAL_COMMUNITY): Payer: Self-pay

## 2021-08-22 DIAGNOSIS — M5416 Radiculopathy, lumbar region: Secondary | ICD-10-CM | POA: Diagnosis not present

## 2021-08-22 NOTE — H&P (Signed)
PREOPERATIVE H&P  Chief Complaint: left elbow pain  HPI: Sean Cochran is a 61 y.o. male who is here for evaluation of left elbow pain.  I have not seen him in a long time.  I did a left elbow distal biceps repair a long time ago.  He has had about six months of lateral-sided elbow pain with extensive conservative treatment including nitroglycerin patches, dry needling, physical therapy, anti-inflammatories and bracing.  None of these have provided significant relief.  Every time he tries to play tennis it gets worse.  He does get some pain medially and some pain anteriorly as well, but the majority of it really is laterally.  He has an MRI as well.  He is otherwise healthy and he is left-hand-dominant.  He has elected surgical management.    Past Medical History:  Diagnosis Date   Abnormal finding on MRI of brain 10/04/2014   Back pain    Colon polyp 02/19/2013   Dr. Collene Mares - Repeat in 5 years   Constipation    Hip pain    History of pineal cyst    Hyperlipidemia    Memory loss    Past Surgical History:  Procedure Laterality Date   BICEPS TENDON REPAIR Left    COLONOSCOPY     ROTATOR CUFF REPAIR Right    TONSILLECTOMY     VASECTOMY     Social History   Socioeconomic History   Marital status: Married    Spouse name: Not on file   Number of children: 2   Years of education: master's   Highest education level: Not on file  Occupational History   Occupation: Chartered certified accountant   Tobacco Use   Smoking status: Never   Smokeless tobacco: Never  Vaping Use   Vaping Use: Never used  Substance and Sexual Activity   Alcohol use: Yes    Alcohol/week: 3.0 standard drinks of alcohol    Types: 3 Glasses of wine per week   Drug use: No   Sexual activity: Yes    Partners: Female  Other Topics Concern   Not on file  Social History Narrative   Marital Status: Married Radiographer, therapeutic)   Children:  Daughter Apolonio Schneiders); Son Randall Hiss)   Pets:  None    Living Situation: Lives with spouse and  children   Occupation: Doctor, 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     Drug Use:  None   Diet:  Regular   Exercise:  Cardio/Weights/Cutting Grass   Hobbies:  Golf, Music, Motorcycles      Patient drinks 2-3 cups of caffeine daily.   Patient is left handed.   Social Determinants of Health   Financial Resource Strain: Not on file  Food Insecurity: Not on file  Transportation Needs: Not on file  Physical Activity: Not on file  Stress: Not on file  Social Connections: Not on file   Family History  Problem Relation Age of Onset   Cancer Father        Lymphoma   Depression Sister    Heart attack Brother    Healthy Brother    Healthy Brother    Cancer Maternal Grandfather        Paternal Grandfather   Prostate cancer Maternal Grandfather    Prostate cancer Paternal Grandfather    Colon cancer Neg Hx    Colon polyps Neg Hx  Esophageal cancer Neg Hx    Rectal cancer Neg Hx    Stomach cancer Neg Hx    No Known Allergies Prior to Admission medications   Medication Sig Start Date End Date Taking? Authorizing Provider  cetirizine (ZYRTEC) 10 MG tablet Take 1 tablet (10 mg total) by mouth daily. 06/20/20 06/20/21  Biagio Borg, MD  finasteride (PROPECIA) 1 MG tablet Take 1 tablet by mouth once a day 02/07/21     fluticasone (FLONASE) 50 MCG/ACT nasal spray Place 2 sprays into both nostrils daily. 11/09/20   Muthersbaugh, Jarrett Soho, PA-C  meloxicam (MOBIC) 15 MG tablet Take 1 tablet (15 mg total) by mouth daily as needed for pain. Take with food. 06/12/21   Thurman Coyer, DO  nitroGLYCERIN (NITRODUR - DOSED IN MG/24 HR) 0.2 mg/hr patch Use 1/4 patch daily to the affected area. 04/25/21   Lilia Argue R, DO  triamcinolone cream (KENALOG) 0.5 % Apply 1 application. topically 3 (three) times daily. 07/14/21   Biagio Borg, MD     Positive ROS: All other systems have been reviewed and  were otherwise negative with the exception of those mentioned in the HPI and as above.  Physical Exam: General: Alert, no acute distress Cardiovascular: No pedal edema Respiratory: No cyanosis, no use of accessory musculature GI: No organomegaly, abdomen is soft and non-tender Skin: No lesions in the area of chief complaint Neurologic: Sensation intact distally Psychiatric: Patient is competent for consent with normal mood and affect Lymphatic: No axillary or cervical lymphadenopathy  MUSCULOSKELETAL: He has full motion of the left elbow.  Supination and pronation strength is intact.  Biceps repair looks intact.  Surgical wounds have healed well.  He does have pain with resisted extension of the wrist at the ECRB origin.    X-RAYS: MRI demonstrates high grade intrasubstance tearing of the ECRB.  He also has some bulky osteophyte formation from his previous biceps repair.    Assessment: Left ECRB high grade tendinopathy/tennis elbow   Plan: This has been a persistent chronic problem.  It has been six months without significant improvement.  He would like to proceed with surgical intervention to try to optimize his long-term function and allow him to get back to tennis.  Risks, benefits and alternatives have been discussed at length.  All questions have been answered.  We will plan to proceed accordingly.  We will likely repair his tendon back to the bone.     Plan for Procedure(s): TENNIS ELBOW RELEASE/NIRSCHEL PROCEDURE  The risks benefits and alternatives were discussed with the patient including but not limited to the risks of nonoperative treatment, versus surgical intervention including infection, bleeding, nerve injury,  blood clots, cardiopulmonary complications, morbidity, mortality, among others, and they were willing to proceed.    Ventura Bruns, PA-C    08/22/2021 2:01 PM

## 2021-08-22 NOTE — Discharge Instructions (Addendum)
Diet: As you were doing prior to hospitalization   Shower:  May shower but keep the wounds dry, use an occlusive plastic wrap, NO SOAKING IN TUB.  If the bandage gets wet, change with a clean dry gauze.  If you have a splint on, leave the splint in place and keep the splint dry with a plastic bag.  Dressing:  You may change your dressing 3-5 days after surgery, unless you have a splint.  If you have a splint, then just leave the splint in place and we will change your bandages during your first follow-up appointment.    If you had hand or foot surgery, we will plan to remove your stitches in about 2 weeks in the office.  For all other surgeries, there are sticky tapes (steri-strips) on your wounds and all the stitches are absorbable.  Leave the steri-strips in place when changing your dressings, they will peel off with time, usually 2-3 weeks.  Activity:  Increase activity slowly as tolerated, but follow the weight bearing instructions below.  The rules on driving is that you can not be taking narcotics while you drive, and you must feel in control of the vehicle.    Weight Bearing:   no weight bearing left arm until release by surgeon  To prevent constipation: you may use a stool softener such as -  Colace (over the counter) 100 mg by mouth twice a day  Drink plenty of fluids (prune juice may be helpful) and high fiber foods Miralax (over the counter) for constipation as needed.    Itching:  If you experience itching with your medications, try taking only a single pain pill, or even half a pain pill at a time.  You may take up to 10 pain pills per day, and you can also use benadryl over the counter for itching or also to help with sleep.   Precautions:  If you experience chest pain or shortness of breath - call 911 immediately for transfer to the hospital emergency department!!  If you develop a fever greater that 101 F, purulent drainage from wound, increased redness or drainage from wound,  or calf pain -- Call the office at [phone]                                                Follow- Up Appointment:  Please call for an appointment to be seen in 2 weeks [place] - [phone]   Post Anesthesia Home Care Instructions  Activity: Get plenty of rest for the remainder of the day. A responsible individual must stay with you for 24 hours following the procedure.  For the next 24 hours, DO NOT: -Drive a car -Paediatric nurse -Drink alcoholic beverages -Take any medication unless instructed by your physician -Make any legal decisions or sign important papers.  Meals: Start with liquid foods such as gelatin or soup. Progress to regular foods as tolerated. Avoid greasy, spicy, heavy foods. If nausea and/or vomiting occur, drink only clear liquids until the nausea and/or vomiting subsides. Call your physician if vomiting continues.  Special Instructions/Symptoms: Your throat may feel dry or sore from the anesthesia or the breathing tube placed in your throat during surgery. If this causes discomfort, gargle with warm salt water. The discomfort should disappear within 24 hours.  If you had a scopolamine patch placed behind your ear for  the management of post- operative nausea and/or vomiting:  1. The medication in the patch is effective for 72 hours, after which it should be removed.  Wrap patch in a tissue and discard in the trash. Wash hands thoroughly with soap and water. 2. You may remove the patch earlier than 72 hours if you experience unpleasant side effects which may include dry mouth, dizziness or visual disturbances. 3. Avoid touching the patch. Wash your hands with soap and water after contact with the patch.

## 2021-08-24 ENCOUNTER — Encounter (HOSPITAL_BASED_OUTPATIENT_CLINIC_OR_DEPARTMENT_OTHER): Payer: Self-pay | Admitting: Orthopedic Surgery

## 2021-08-24 ENCOUNTER — Other Ambulatory Visit: Payer: Self-pay

## 2021-08-31 ENCOUNTER — Ambulatory Visit (HOSPITAL_BASED_OUTPATIENT_CLINIC_OR_DEPARTMENT_OTHER): Payer: 59 | Admitting: Certified Registered"

## 2021-08-31 ENCOUNTER — Other Ambulatory Visit (HOSPITAL_COMMUNITY): Payer: Self-pay

## 2021-08-31 ENCOUNTER — Other Ambulatory Visit: Payer: Self-pay

## 2021-08-31 ENCOUNTER — Ambulatory Visit (HOSPITAL_BASED_OUTPATIENT_CLINIC_OR_DEPARTMENT_OTHER)
Admission: RE | Admit: 2021-08-31 | Discharge: 2021-08-31 | Disposition: A | Discharge status: Home / Self Care | Payer: 59 | Attending: Orthopedic Surgery | Admitting: Orthopedic Surgery

## 2021-08-31 ENCOUNTER — Encounter (HOSPITAL_BASED_OUTPATIENT_CLINIC_OR_DEPARTMENT_OTHER)
Admission: RE | Disposition: A | Discharge status: Home / Self Care | Payer: Self-pay | Source: Home / Self Care | Attending: Orthopedic Surgery

## 2021-08-31 ENCOUNTER — Encounter (HOSPITAL_BASED_OUTPATIENT_CLINIC_OR_DEPARTMENT_OTHER): Payer: Self-pay | Admitting: Orthopedic Surgery

## 2021-08-31 DIAGNOSIS — M25522 Pain in left elbow: Secondary | ICD-10-CM | POA: Diagnosis present

## 2021-08-31 DIAGNOSIS — M67834 Other specified disorders of tendon, left wrist: Secondary | ICD-10-CM | POA: Diagnosis not present

## 2021-08-31 DIAGNOSIS — M7712 Lateral epicondylitis, left elbow: Secondary | ICD-10-CM | POA: Diagnosis not present

## 2021-08-31 DIAGNOSIS — M67824 Other specified disorders of tendon, left elbow: Secondary | ICD-10-CM | POA: Insufficient documentation

## 2021-08-31 DIAGNOSIS — M7702 Medial epicondylitis, left elbow: Secondary | ICD-10-CM | POA: Diagnosis not present

## 2021-08-31 DIAGNOSIS — E785 Hyperlipidemia, unspecified: Secondary | ICD-10-CM | POA: Insufficient documentation

## 2021-08-31 DIAGNOSIS — Z79899 Other long term (current) drug therapy: Secondary | ICD-10-CM | POA: Insufficient documentation

## 2021-08-31 HISTORY — PX: TENNIS ELBOW RELEASE/NIRSCHEL PROCEDURE: SHX6651

## 2021-08-31 SURGERY — TENNIS ELBOW RELEASE/NIRSCHEL PROCEDURE
Anesthesia: General | Site: Elbow | Laterality: Left

## 2021-08-31 MED ORDER — FENTANYL CITRATE (PF) 100 MCG/2ML IJ SOLN
INTRAMUSCULAR | Status: AC
  Filled 2021-08-31: qty 2

## 2021-08-31 MED ORDER — ACETAMINOPHEN 500 MG PO TABS
ORAL_TABLET | ORAL | Status: AC
  Filled 2021-08-31: qty 2

## 2021-08-31 MED ORDER — MIDAZOLAM HCL 2 MG/2ML IJ SOLN
INTRAMUSCULAR | Status: AC
  Filled 2021-08-31: qty 2

## 2021-08-31 MED ORDER — ACETAMINOPHEN 500 MG PO TABS
1000.0000 mg | ORAL_TABLET | Freq: Once | ORAL | Status: AC
  Administered 2021-08-31: 1000 mg via ORAL

## 2021-08-31 MED ORDER — HYDROCODONE-ACETAMINOPHEN 10-325 MG PO TABS
1.0000 | ORAL_TABLET | Freq: Four times a day (QID) | ORAL | 0 refills | Status: DC | PRN
  Filled 2021-08-31: qty 28, 7d supply, fill #0

## 2021-08-31 MED ORDER — ONDANSETRON HCL 4 MG/2ML IJ SOLN
INTRAMUSCULAR | Status: DC | PRN
  Administered 2021-08-31: 4 mg via INTRAVENOUS

## 2021-08-31 MED ORDER — EPHEDRINE SULFATE (PRESSORS) 50 MG/ML IJ SOLN
INTRAMUSCULAR | Status: DC | PRN
  Administered 2021-08-31: 10 mg via INTRAVENOUS

## 2021-08-31 MED ORDER — LIDOCAINE 2% (20 MG/ML) 5 ML SYRINGE
INTRAMUSCULAR | Status: AC
  Filled 2021-08-31: qty 5

## 2021-08-31 MED ORDER — OXYCODONE HCL 5 MG PO TABS: 5.0000 mg | ORAL_TABLET | Freq: Once | ORAL | Status: DC | PRN

## 2021-08-31 MED ORDER — FENTANYL CITRATE (PF) 100 MCG/2ML IJ SOLN: 25.0000 ug | INTRAMUSCULAR | Status: DC | PRN

## 2021-08-31 MED ORDER — ONDANSETRON HCL 4 MG PO TABS
4.0000 mg | ORAL_TABLET | Freq: Three times a day (TID) | ORAL | 0 refills | Status: DC | PRN
  Filled 2021-08-31: qty 10, 4d supply, fill #0

## 2021-08-31 MED ORDER — ONDANSETRON HCL 4 MG/2ML IJ SOLN
INTRAMUSCULAR | Status: AC
  Filled 2021-08-31: qty 2

## 2021-08-31 MED ORDER — 0.9 % SODIUM CHLORIDE (POUR BTL) OPTIME
TOPICAL | Status: DC | PRN
  Administered 2021-08-31: 1000 mL

## 2021-08-31 MED ORDER — POVIDONE-IODINE 10 % EX SWAB: 2.0000 | Freq: Once | CUTANEOUS | Status: DC

## 2021-08-31 MED ORDER — CELECOXIB 200 MG PO CAPS
ORAL_CAPSULE | ORAL | Status: AC
  Filled 2021-08-31: qty 1

## 2021-08-31 MED ORDER — POVIDONE-IODINE 7.5 % EX SOLN
Freq: Once | CUTANEOUS | Status: DC
  Filled 2021-08-31: qty 118

## 2021-08-31 MED ORDER — OXYCODONE HCL 5 MG/5ML PO SOLN: 5.0000 mg | Freq: Once | ORAL | Status: DC | PRN

## 2021-08-31 MED ORDER — CEFAZOLIN SODIUM-DEXTROSE 2-4 GM/100ML-% IV SOLN
INTRAVENOUS | Status: AC
  Filled 2021-08-31: qty 100

## 2021-08-31 MED ORDER — CEFAZOLIN SODIUM-DEXTROSE 2-4 GM/100ML-% IV SOLN
2.0000 g | INTRAVENOUS | Status: AC
  Administered 2021-08-31: 2 g via INTRAVENOUS

## 2021-08-31 MED ORDER — MIDAZOLAM HCL 5 MG/5ML IJ SOLN
INTRAMUSCULAR | Status: DC | PRN
  Administered 2021-08-31: 2 mg via INTRAVENOUS

## 2021-08-31 MED ORDER — BUPIVACAINE HCL (PF) 0.5 % IJ SOLN
INTRAMUSCULAR | Status: DC | PRN
  Administered 2021-08-31: 10 mL

## 2021-08-31 MED ORDER — DEXAMETHASONE SODIUM PHOSPHATE 10 MG/ML IJ SOLN
INTRAMUSCULAR | Status: DC | PRN
  Administered 2021-08-31: 10 mg via INTRAVENOUS

## 2021-08-31 MED ORDER — LACTATED RINGERS IV SOLN: INTRAVENOUS | Status: DC

## 2021-08-31 MED ORDER — DEXAMETHASONE SODIUM PHOSPHATE 10 MG/ML IJ SOLN
INTRAMUSCULAR | Status: AC
  Filled 2021-08-31: qty 1

## 2021-08-31 MED ORDER — PROPOFOL 10 MG/ML IV BOLUS
INTRAVENOUS | Status: AC
  Filled 2021-08-31: qty 20

## 2021-08-31 MED ORDER — CELECOXIB 200 MG PO CAPS
200.0000 mg | ORAL_CAPSULE | Freq: Once | ORAL | Status: AC
  Administered 2021-08-31: 200 mg via ORAL

## 2021-08-31 MED ORDER — PROPOFOL 10 MG/ML IV BOLUS
INTRAVENOUS | Status: DC | PRN
  Administered 2021-08-31: 170 mg via INTRAVENOUS

## 2021-08-31 MED ORDER — LIDOCAINE HCL (CARDIAC) PF 100 MG/5ML IV SOSY
PREFILLED_SYRINGE | INTRAVENOUS | Status: DC | PRN
  Administered 2021-08-31: 60 mg via INTRAVENOUS

## 2021-08-31 MED ORDER — ONDANSETRON HCL 4 MG/2ML IJ SOLN: 4.0000 mg | Freq: Once | INTRAMUSCULAR | Status: DC | PRN

## 2021-08-31 MED ORDER — FENTANYL CITRATE (PF) 100 MCG/2ML IJ SOLN
INTRAMUSCULAR | Status: DC | PRN
  Administered 2021-08-31: 50 ug via INTRAVENOUS

## 2021-08-31 SURGICAL SUPPLY — 53 items
BLADE SURG 15 STRL LF DISP TIS (BLADE) ×2 IMPLANT
BLADE SURG 15 STRL SS (BLADE) ×2
BNDG CMPR 9X4 STRL LF SNTH (GAUZE/BANDAGES/DRESSINGS) ×1
BNDG ELASTIC 3X5.8 VLCR STR LF (GAUZE/BANDAGES/DRESSINGS) ×6 IMPLANT
BNDG ESMARK 4X9 LF (GAUZE/BANDAGES/DRESSINGS) ×1 IMPLANT
BNDG GAUZE DERMACEA FLUFF (GAUZE/BANDAGES/DRESSINGS) ×1
BNDG GAUZE DERMACEA FLUFF 4 (GAUZE/BANDAGES/DRESSINGS) IMPLANT
BNDG GZE DERMACEA 4 6PLY (GAUZE/BANDAGES/DRESSINGS) ×1
CLSR STERI-STRIP ANTIMIC 1/2X4 (GAUZE/BANDAGES/DRESSINGS) ×3 IMPLANT
CORD BIPOLAR FORCEPS 12FT (ELECTRODE) IMPLANT
COVER BACK TABLE 60X90IN (DRAPES) ×3 IMPLANT
CUFF TOURN SGL QUICK 18X3 (MISCELLANEOUS) ×4 IMPLANT
CUFF TOURN SGL QUICK 18X4 (TOURNIQUET CUFF) ×1 IMPLANT
DRAPE EXTREMITY T 121X128X90 (DISPOSABLE) ×3 IMPLANT
DRAPE IMP U-DRAPE 54X76 (DRAPES) ×3 IMPLANT
DRAPE SURG 17X23 STRL (DRAPES) ×3 IMPLANT
DURAPREP 26ML APPLICATOR (WOUND CARE) ×3 IMPLANT
GAUZE SPONGE 4X4 12PLY STRL (GAUZE/BANDAGES/DRESSINGS) ×3 IMPLANT
GLOVE BIO SURGEON STRL SZ7 (GLOVE) ×3 IMPLANT
GLOVE BIOGEL PI IND STRL 7.0 (GLOVE) ×2 IMPLANT
GLOVE BIOGEL PI IND STRL 8 (GLOVE) ×4 IMPLANT
GLOVE BIOGEL PI INDICATOR 7.0 (GLOVE) ×1
GLOVE BIOGEL PI INDICATOR 8 (GLOVE) ×2
GLOVE ORTHO TXT STRL SZ7.5 (GLOVE) ×3 IMPLANT
GLOVE SURG ORTHO 8.0 STRL STRW (GLOVE) ×3 IMPLANT
GOWN STRL REUS W/ TWL LRG LVL3 (GOWN DISPOSABLE) ×2 IMPLANT
GOWN STRL REUS W/ TWL XL LVL3 (GOWN DISPOSABLE) ×4 IMPLANT
GOWN STRL REUS W/TWL LRG LVL3 (GOWN DISPOSABLE) ×2
GOWN STRL REUS W/TWL XL LVL3 (GOWN DISPOSABLE) ×4
NDL HYPO 25X1 1.5 SAFETY (NEEDLE) IMPLANT
NEEDLE HYPO 25X1 1.5 SAFETY (NEEDLE) ×2 IMPLANT
NS IRRIG 1000ML POUR BTL (IV SOLUTION) ×3 IMPLANT
PACK BASIN DAY SURGERY FS (CUSTOM PROCEDURE TRAY) ×3 IMPLANT
PADDING CAST ABS 3INX4YD NS (CAST SUPPLIES) ×1
PADDING CAST ABS 4INX4YD NS (CAST SUPPLIES) ×1
PADDING CAST ABS COTTON 3X4 (CAST SUPPLIES) ×4 IMPLANT
PADDING CAST ABS COTTON 4X4 ST (CAST SUPPLIES) ×2 IMPLANT
PASSER SUT SWANSON 36MM LOOP (INSTRUMENTS) IMPLANT
SLEEVE SCD COMPRESS KNEE MED (STOCKING) ×3 IMPLANT
SLING ARM FOAM STRAP XLG (SOFTGOODS) ×1 IMPLANT
SPLINT FAST PLASTER 5X30 (CAST SUPPLIES) ×5
SPLINT PLASTER CAST FAST 5X30 (CAST SUPPLIES) IMPLANT
SPLINT PLASTER CAST XFAST 3X15 (CAST SUPPLIES) IMPLANT
SPLINT PLASTER XTRA FASTSET 3X (CAST SUPPLIES)
STOCKINETTE 4X48 STRL (DRAPES) ×3 IMPLANT
SUT ETHIBOND 2 OS 4 DA (SUTURE) ×3 IMPLANT
SUT MNCRL AB 4-0 PS2 18 (SUTURE) ×3 IMPLANT
SUT VICRYL 0 SH 27 (SUTURE) ×1 IMPLANT
SUT VICRYL 3-0 CR8 SH (SUTURE) ×3 IMPLANT
SYR BULB EAR ULCER 3OZ GRN STR (SYRINGE) ×3 IMPLANT
SYR CONTROL 10ML LL (SYRINGE) ×1 IMPLANT
TOWEL GREEN STERILE FF (TOWEL DISPOSABLE) ×3 IMPLANT
UNDERPAD 30X36 HEAVY ABSORB (UNDERPADS AND DIAPERS) ×3 IMPLANT

## 2021-08-31 NOTE — Interval H&P Note (Signed)
History and Physical Interval Note:  08/31/2021 1:29 PM  Sean Cochran  has presented today for surgery, with the diagnosis of medial epicondylitis.  The various methods of treatment have been discussed with the patient and family. After consideration of risks, benefits and other options for treatment, the patient has consented to  left tennis elbow repair as a surgical intervention.  The patient's history has been reviewed, patient examined, no change in status, stable for surgery.  I have reviewed the patient's chart and labs.  Questions were answered to the patient's satisfaction.     Sean Cochran

## 2021-08-31 NOTE — Anesthesia Procedure Notes (Signed)
Procedure Name: LMA Insertion Date/Time: 08/31/2021 1:42 PM  Performed by: Maryella Shivers, CRNAPre-anesthesia Checklist: Patient identified, Emergency Drugs available, Suction available and Patient being monitored Patient Re-evaluated:Patient Re-evaluated prior to induction Oxygen Delivery Method: Circle system utilized Preoxygenation: Pre-oxygenation with 100% oxygen Induction Type: IV induction Ventilation: Mask ventilation without difficulty LMA: LMA inserted LMA Size: 5.0 Number of attempts: 1 Airway Equipment and Method: Bite block Placement Confirmation: positive ETCO2 Tube secured with: Tape Dental Injury: Teeth and Oropharynx as per pre-operative assessment

## 2021-08-31 NOTE — Op Note (Signed)
08/31/2021  2:09 PM  PATIENT:  Sean Cochran    PRE-OPERATIVE DIAGNOSIS: Left extensor carpi radialis brevis tendinopathy  POST-OPERATIVE DIAGNOSIS:  Same  PROCEDURE: Repair of left extensor carpi radialis brevis tendon/tennis elbow repair  SURGEON:  Johnny Bridge, MD  PHYSICIAN ASSISTANT: Chriss Czar, PA-C, present and scrubbed throughout the case, critical for completion in a timely fashion, and for retraction, instrumentation, and closure.  ANESTHESIA:   General  PREOPERATIVE INDICATIONS:  Sean Cochran is a  61 y.o. male with a diagnosis of medial epicondylitis who failed conservative measures and elected for surgical management.    The risks benefits and alternatives were discussed with the patient preoperatively including but not limited to the risks of infection, bleeding, nerve injury, cardiopulmonary complications, the need for revision surgery, among others, incomplete relief of pain, and the patient was willing to proceed.  ESTIMATED BLOOD LOSS: Minimal  OPERATIVE IMPLANTS: 0 Vicryl for the tendon  OPERATIVE FINDINGS: Minimal gross macroscopic tearing  OPERATIVE PROCEDURE: The patient was brought to the operating room and placed in the supine position.  General anesthesia was administered.  The left upper extremity was prepped and draped in usual sterile fashion.  Timeout performed.  The arm was elevated and exsanguinated and a tourniquet was inflated.  250 mmHg was utilized for approximately 20 minutes.  Incision was made over the lateral epicondyle of the humerus, and extended distally.  Dissection was carried down, cauterization of large veins performed, and then incision through the extensor carpi radialis longus was carried out, exposing some of the deep muscular fibers, which were bluntly dissected and then the extensor carpi radialis brevis tendon was identified, this was still attached to the bone.  I took care to remain on the anterior aspect of the  epicondyle, and released the tendon in line with its fibers.  Dissection carried down into the joint capsule, but not through the capsule.  The tendon was debrided, rongeur used to roughen up the bony edge of the epicondyle as well as the fibrinous material within the tendon, and then the wounds were irrigated copiously, and the tendon repaired with a running Vicryl suture.  This was repaired back to the periosteum of the distal humerus, and then subcutaneous tissue closed with Vicryl and Monocryl for the skin with Steri-Strips and sterile gauze and the wounds were injected and he was placed in a posterior splint after sterile gauze applied.  He  He was awakened and returned to the PACU in stable and satisfactory condition.  There were no complications and he tolerated the procedure well.

## 2021-08-31 NOTE — Transfer of Care (Signed)
Immediate Anesthesia Transfer of Care Note  Patient: Sean Cochran  Procedure(s) Performed: TENNIS ELBOW RELEASE/NIRSCHEL PROCEDURE (Left: Elbow)  Patient Location: PACU  Anesthesia Type:General  Level of Consciousness: sedated  Airway & Oxygen Therapy: Patient Spontanous Breathing and Patient connected to face mask oxygen  Post-op Assessment: Report given to RN and Post -op Vital signs reviewed and stable  Post vital signs: Reviewed and stable  Last Vitals:  Vitals Value Taken Time  BP 115/72 08/31/21 1438  Temp    Pulse 95 08/31/21 1442  Resp 14 08/31/21 1442  SpO2 100 % 08/31/21 1442  Vitals shown include unvalidated device data.  Last Pain:  Vitals:   08/31/21 1244  TempSrc: Oral  PainSc: 3       Patients Stated Pain Goal: 3 ([id number])  Complications: No notable events documented.

## 2021-08-31 NOTE — Anesthesia Postprocedure Evaluation (Signed)
Anesthesia Post Note  Patient: Sean Cochran  Procedure(s) Performed: TENNIS ELBOW RELEASE/NIRSCHEL PROCEDURE (Left: Elbow)     Patient location during evaluation: PACU Anesthesia Type: General Level of consciousness: awake and alert Pain management: pain level controlled Vital Signs Assessment: post-procedure vital signs reviewed and stable Respiratory status: spontaneous breathing, nonlabored ventilation and respiratory function stable Cardiovascular status: stable and blood pressure returned to baseline Anesthetic complications: no   No notable events documented.  Last Vitals:  Vitals:   08/31/21 1445 08/31/21 1500  BP: 122/79 121/76  Pulse: 89 85  Resp: 12 12  Temp:    SpO2: 100% 97%    Last Pain:  Vitals:   08/31/21 1500  TempSrc:   PainSc: 0-No pain                 Audry Pili

## 2021-08-31 NOTE — Anesthesia Preprocedure Evaluation (Addendum)
Anesthesia Evaluation  Patient identified by MRN, date of birth, ID band Patient awake    Reviewed: Allergy & Precautions, NPO status , Patient's Chart, lab work & pertinent test results  History of Anesthesia Complications Negative for: history of anesthetic complications  Airway Mallampati: III  TM Distance: <3 FB Neck ROM: Full    Dental  (+) Dental Advisory Given, Teeth Intact   Pulmonary neg pulmonary ROS,    Pulmonary exam normal        Cardiovascular negative cardio ROS Normal cardiovascular exam     Neuro/Psych  Hx memory loss  negative psych ROS   GI/Hepatic negative GI ROS, (+) Hepatitis - ([facility])  Endo/Other  negative endocrine ROS  Renal/GU negative Renal ROS     Musculoskeletal negative musculoskeletal ROS (+)   Abdominal   Peds  Hematology negative hematology ROS (+)   Anesthesia Other Findings   Reproductive/Obstetrics                            Anesthesia Physical Anesthesia Plan  ASA: 1  Anesthesia Plan: General   Post-op Pain Management: Tylenol PO (pre-op)* and Celebrex PO (pre-op)*   Induction: Intravenous  PONV Risk Score and Plan: 2 and Treatment may vary due to age or medical condition, Ondansetron, Dexamethasone and Midazolam  Airway Management Planned: LMA  Additional Equipment: None  Intra-op Plan:   Post-operative Plan: Extubation in OR  Informed Consent: I have reviewed the patients History and Physical, chart, labs and discussed the procedure including the risks, benefits and alternatives for the proposed anesthesia with the patient or authorized representative who has indicated his/her understanding and acceptance.     Dental advisory given  Plan Discussed with: CRNA and Anesthesiologist  Anesthesia Plan Comments:       Anesthesia Quick Evaluation

## 2021-09-01 ENCOUNTER — Encounter (HOSPITAL_BASED_OUTPATIENT_CLINIC_OR_DEPARTMENT_OTHER): Payer: Self-pay | Admitting: Orthopedic Surgery

## 2021-09-13 DIAGNOSIS — M7712 Lateral epicondylitis, left elbow: Secondary | ICD-10-CM | POA: Diagnosis not present

## 2021-10-04 ENCOUNTER — Other Ambulatory Visit (HOSPITAL_COMMUNITY): Payer: Self-pay

## 2021-10-04 ENCOUNTER — Telehealth: Payer: 59 | Admitting: Physician Assistant

## 2021-10-04 DIAGNOSIS — U071 COVID-19: Secondary | ICD-10-CM | POA: Diagnosis not present

## 2021-10-04 MED ORDER — NIRMATRELVIR/RITONAVIR (PAXLOVID)TABLET
3.0000 | ORAL_TABLET | Freq: Two times a day (BID) | ORAL | 0 refills | Status: AC
  Filled 2021-10-04: qty 30, 5d supply, fill #0

## 2021-10-04 MED ORDER — BENZONATATE 100 MG PO CAPS
100.0000 mg | ORAL_CAPSULE | Freq: Three times a day (TID) | ORAL | 0 refills | Status: DC | PRN
  Filled 2021-10-04: qty 30, 10d supply, fill #0

## 2021-10-04 MED ORDER — ALBUTEROL SULFATE HFA 108 (90 BASE) MCG/ACT IN AERS
2.0000 | INHALATION_SPRAY | Freq: Four times a day (QID) | RESPIRATORY_TRACT | 0 refills | Status: AC | PRN
  Filled 2021-10-04: qty 8.5, 25d supply, fill #0

## 2021-10-04 NOTE — Patient Instructions (Signed)
Sean Cochran, thank you for joining [name], PA-C for today's virtual visit.  While this provider is not your primary care provider (PCP), if your PCP is located in our provider database this encounter information will be shared with them immediately following your visit.  Consent: (Patient) Sean Cochran provided verbal consent for this virtual visit at the beginning of the encounter.  Current Medications:  Current Outpatient Medications:    finasteride (PROPECIA) 1 MG tablet, Take 1 tablet by mouth once a day, Disp: 90 tablet, Rfl: 3   fluticasone (FLONASE) 50 MCG/ACT nasal spray, Place 2 sprays into both nostrils daily., Disp: 16 g, Rfl: 0   HYDROcodone-acetaminophen (NORCO) 10-325 MG tablet, Take 1 tablet by mouth every 6 (six) hours as needed., Disp: 28 tablet, Rfl: 0   nitroGLYCERIN (NITRODUR - DOSED IN MG/24 HR) 0.2 mg/hr patch, Use 1/4 patch daily to the affected area., Disp: 30 patch, Rfl: 1   ondansetron (ZOFRAN) 4 MG tablet, Take 1 tablet (4 mg total) by mouth every 8 (eight) hours as needed for nausea or vomiting., Disp: 10 tablet, Rfl: 0   triamcinolone cream (KENALOG) 0.5 %, Apply 1 application. topically 3 (three) times daily., Disp: 30 g, Rfl: 0   Medications ordered in this encounter:  No orders of the defined types were placed in this encounter.    *If you need refills on other medications prior to your next appointment, please contact your pharmacy*  Follow-Up: Call back or seek an in-person evaluation if the symptoms worsen or if the condition fails to improve as anticipated.  Other Instructions Please keep well-hydrated and get plenty of rest. Start a saline nasal rinse to flush out your nasal passages. You can use plain Mucinex to help thin congestion. If you have a humidifier, running in the bedroom at night. I want you to start OTC vitamin D3 1000 units daily, vitamin C 1000 mg daily, and a zinc supplement. Please take prescribed medications  as directed.  You have been enrolled in a MyChart symptom monitoring program. Please answer these questions daily so we can keep track of how you are doing.  You were to quarantine for 5 days from onset of your symptoms.  After day 5, if you have had no fever and you are feeling better, you can end quarantine but need to mask for an additional 5 days. After day 5 if you have a fever or are having significant symptoms, please quarantine for full 10 days.  If you note any worsening of symptoms, any significant shortness of breath or any chest pain, please seek ER evaluation ASAP.  Please do not delay care!  COVID-19: What to Do if You Are Sick If you test positive and are an older adult or someone who is at high risk of getting very sick from COVID-19, treatment may be available. Contact a healthcare provider right away after a positive test to determine if you are eligible, even if your symptoms are mild right now. You can also visit a Test to Treat location and, if eligible, receive a prescription from a provider. Don't delay: Treatment must be started within the first few days to be effective. If you have a fever, cough, or other symptoms, you might have COVID-19. Most people have mild illness and are able to recover at home. If you are sick: Keep track of your symptoms. If you have an emergency warning sign (including trouble breathing), call 911. Steps to help prevent the spread of COVID-19  if you are sick If you are sick with COVID-19 or think you might have COVID-19, follow the steps below to care for yourself and to help protect other people in your home and community. Stay home except to get medical care Stay home. Most people with COVID-19 have mild illness and can recover at home without medical care. Do not leave your home, except to get medical care. Do not visit public areas and do not go to places where you are unable to wear a mask. Take care of yourself. Get rest and stay hydrated.  Take over-the-counter medicines, such as acetaminophen, to help you feel better. Stay in touch with your doctor. Call before you get medical care. Be sure to get care if you have trouble breathing, or have any other emergency warning signs, or if you think it is an emergency. Avoid public transportation, ride-sharing, or taxis if possible. Get tested If you have symptoms of COVID-19, get tested. While waiting for test results, stay away from others, including staying apart from those living in your household. Get tested as soon as possible after your symptoms start. Treatments may be available for people with COVID-19 who are at risk for becoming very sick. Don't delay: Treatment must be started early to be effective--some treatments must begin within 5 days of your first symptoms. Contact your healthcare provider right away if your test result is positive to determine if you are eligible. Self-tests are one of several options for testing for the virus that causes COVID-19 and may be more convenient than laboratory-based tests and point-of-care tests. Ask your healthcare provider or your local health department if you need help interpreting your test results. You can visit your state, tribal, local, and territorial health department's website to look for the latest local information on testing sites. Separate yourself from other people As much as possible, stay in a specific room and away from other people and pets in your home. If possible, you should use a separate bathroom. If you need to be around other people or animals in or outside of the home, wear a well-fitting mask. Tell your close contacts that they may have been exposed to COVID-19. An infected person can spread COVID-19 starting 48 hours (or 2 days) before the person has any symptoms or tests positive. By letting your close contacts know they may have been exposed to COVID-19, you are helping to protect everyone. See COVID-19 and Animals if  you have questions about pets. If you are diagnosed with COVID-19, someone from the health department may call you. Answer the call to slow the spread. Monitor your symptoms Symptoms of COVID-19 include fever, cough, or other symptoms. Follow care instructions from your healthcare provider and local health department. Your local health authorities may give instructions on checking your symptoms and reporting information. When to seek emergency medical attention Look for emergency warning signs* for COVID-19. If someone is showing any of these signs, seek emergency medical care immediately: Trouble breathing Persistent pain or pressure in the chest New confusion Inability to wake or stay awake Pale, gray, or blue-colored skin, lips, or nail beds, depending on skin tone *This list is not all possible symptoms. Please call your medical provider for any other symptoms that are severe or concerning to you. Call 911 or call ahead to your local emergency facility: Notify the operator that you are seeking care for someone who has or may have COVID-19. Call ahead before visiting your doctor Call ahead. Many medical visits for  routine care are being postponed or done by phone or telemedicine. If you have a medical appointment that cannot be postponed, call your doctor's office, and tell them you have or may have COVID-19. This will help the office protect themselves and other patients. If you are sick, wear a well-fitting mask You should wear a mask if you must be around other people or animals, including pets (even at home). Wear a mask with the best fit, protection, and comfort for you. You don't need to wear the mask if you are alone. If you can't put on a mask (because of trouble breathing, for example), cover your coughs and sneezes in some other way. Try to stay at least 6 feet away from other people. This will help protect the people around you. Masks should not be placed on young children under  age 25 years, anyone who has trouble breathing, or anyone who is not able to remove the mask without help. Cover your coughs and sneezes Cover your mouth and nose with a tissue when you cough or sneeze. Throw away used tissues in a lined trash can. Immediately wash your hands with soap and water for at least 20 seconds. If soap and water are not available, clean your hands with an alcohol-based hand sanitizer that contains at least 60% alcohol. Clean your hands often Wash your hands often with soap and water for at least 20 seconds. This is especially important after blowing your nose, coughing, or sneezing; going to the bathroom; and before eating or preparing food. Use hand sanitizer if soap and water are not available. Use an alcohol-based hand sanitizer with at least 60% alcohol, covering all surfaces of your hands and rubbing them together until they feel dry. Soap and water are the best option, especially if hands are visibly dirty. Avoid touching your eyes, nose, and mouth with unwashed hands. Handwashing Tips Avoid sharing personal household items Do not share dishes, drinking glasses, cups, eating utensils, towels, or bedding with other people in your home. Wash these items thoroughly after using them with soap and water or put in the dishwasher. Clean surfaces in your home regularly Clean and disinfect high-touch surfaces (for example, doorknobs, tables, handles, light switches, and countertops) in your "sick room" and bathroom. In shared spaces, you should clean and disinfect surfaces and items after each use by the person who is ill. If you are sick and cannot clean, a caregiver or other person should only clean and disinfect the area around you (such as your bedroom and bathroom) on an as needed basis. Your caregiver/other person should wait as long as possible (at least several hours) and wear a mask before entering, cleaning, and disinfecting shared spaces that you use. Clean and  disinfect areas that may have blood, stool, or body fluids on them. Use household cleaners and disinfectants. Clean visible dirty surfaces with household cleaners containing soap or detergent. Then, use a household disinfectant. Use a product from [employer] List N: Disinfectants for Coronavirus (FUXNA-35). Be sure to follow the instructions on the label to ensure safe and effective use of the product. Many products recommend keeping the surface wet with a disinfectant for a certain period of time (look at "contact time" on the product label). You may also need to wear personal protective equipment, such as gloves, depending on the directions on the product label. Immediately after disinfecting, wash your hands with soap and water for 20 seconds. For completed guidance on cleaning and disinfecting your home, visit Complete  Disinfection Guidance. Take steps to improve ventilation at home Improve ventilation (air flow) at home to help prevent from spreading COVID-19 to other people in your household. Clear out COVID-19 virus particles in the air by opening windows, using air filters, and turning on fans in your home. Use this interactive tool to learn how to improve air flow in your home. When you can be around others after being sick with COVID-19 Deciding when you can be around others is different for different situations. Find out when you can safely end home isolation. For any additional questions about your care, contact your healthcare provider or state or local health department. 05/24/2020 Content source: [facility] ([facility]), Division of Viral Diseases This information is not intended to replace advice given to you by your health care provider. Make sure you discuss any questions you have with your health care provider. Document Revised: 07/07/2020 Document Reviewed: 07/07/2020 Elsevier Patient Education  2022 [employer].      If you have been  instructed to have an in-person evaluation today at a local Urgent Care facility, please use the link below. It will take you to a list of all of our available [facility], including address, phone number and hours of operation. Please do not delay care.  [facility]  If you or a family member do not have a primary care provider, use the link below to schedule a visit and establish care. When you choose a  primary care physician or advanced practice provider, you gain a long-term partner in health. Find a Primary Care Provider  Learn more about 's in-office and virtual care options: [facility]

## 2021-10-04 NOTE — Progress Notes (Signed)
Virtual Visit Consent   Sean Pardon Zaremba, you are scheduled for a virtual visit with a [facility] provider today. Just as with appointments in the office, your consent must be obtained to participate. Your consent will be active for this visit and any virtual visit you may have with one of our providers in the next 365 days. If you have a MyChart account, a copy of this consent can be sent to you electronically.  As this is a virtual visit, video technology does not allow for your provider to perform a traditional examination. This may limit your provider's ability to fully assess your condition. If your provider identifies any concerns that need to be evaluated in person or the need to arrange testing (such as labs, EKG, etc.), we will make arrangements to do so. Although advances in technology are sophisticated, we cannot ensure that it will always work on either your end or our end. If the connection with a video visit is poor, the visit may have to be switched to a telephone visit. With either a video or telephone visit, we are not always able to ensure that we have a secure connection.  By engaging in this virtual visit, you consent to the provision of healthcare and authorize for your insurance to be billed (if applicable) for the services provided during this visit. Depending on your insurance coverage, you may receive a charge related to this service.  I need to obtain your verbal consent now. Are you willing to proceed with your visit today? Sean Cochran has provided verbal consent on 10/04/2021 for a virtual visit (video or telephone). [name], [place]  Date: 10/04/2021 2:00 PM  Virtual Visit via Video Note   I, [name], connected with  Sean Cochran  ([id number], [birth year]) on 10/04/21 at  2:00 PM EDT by a video-enabled telemedicine application and verified that I am speaking with the correct person using two identifiers.  Location: Patient: Virtual Visit  Location Patient: Home Provider: Virtual Visit Location Provider: Home Office   I discussed the limitations of evaluation and management by telemedicine and the availability of in person appointments. The patient expressed understanding and agreed to proceed.    History of Present Illness: Sean Cochran is a 61 y.o. who identifies as a male who was assigned male at birth, and is being seen today for COVID-19. Endorses symptoms starting a couple of days ago with mild sore throat and cold symptoms. As of last night, noting significant increase in sore throat with fever, chills, aches. Initially thought related to allergies but giving worsening he took a home test today which was positive. Denies any SOB or chest pain. Denies any significant GI symptoms. Has taken Sudafed and Ibuprofen OTC.   HPI: HPI  Problems:  Patient Active Problem List   Diagnosis Date Noted   Allergic rhinitis 64/33/2951   Nonalcoholic steatohepatitis (NASH) 06/17/2018   Eczema 12/31/2016   Routine general medical examination at a health care facility 06/26/2016   Abnormal finding on MRI of brain 10/04/2014   Hyperlipidemia LDL goal <160 05/08/2013    Allergies: No Known Allergies Medications:  Current Outpatient Medications:    albuterol (VENTOLIN HFA) 108 (90 Base) MCG/ACT inhaler, Inhale 2 puffs into the lungs every 6 (six) hours as needed for wheezing or shortness of breath., Disp: 8 g, Rfl: 0   benzonatate (TESSALON) 100 MG capsule, Take 1 capsule (100 mg total) by mouth 3 (three) times daily as needed for cough.,  Disp: 30 capsule, Rfl: 0   nirmatrelvir/ritonavir EUA (PAXLOVID) 20 x 150 MG & 10 x 100MG TABS, Take 3 tablets by mouth 2 (two) times daily for 5 days. (Take nirmatrelvir 150 mg two tablets twice daily for 5 days and ritonavir 100 mg one tablet twice daily for 5 days) Patient GFR is 87, Disp: 30 tablet, Rfl: 0   finasteride (PROPECIA) 1 MG tablet, Take 1 tablet by mouth once a day, Disp: 90 tablet, Rfl:  3  Observations/Objective: Patient is well-developed, well-nourished in no acute distress.  Resting comfortably at home.  Head is normocephalic, atraumatic.  No labored breathing. Speech is clear and coherent with logical content.  Patient is alert and oriented at baseline.   Assessment and Plan: 1. COVID-19 - MyChart COVID-19 home monitoring program; Future - benzonatate (TESSALON) 100 MG capsule; Take 1 capsule (100 mg total) by mouth 3 (three) times daily as needed for cough.  Dispense: 30 capsule; Refill: 0 - albuterol (VENTOLIN HFA) 108 (90 Base) MCG/ACT inhaler; Inhale 2 puffs into the lungs every 6 (six) hours as needed for wheezing or shortness of breath.  Dispense: 8 g; Refill: 0 - nirmatrelvir/ritonavir EUA (PAXLOVID) 20 x 150 MG & 10 x 100MG TABS; Take 3 tablets by mouth 2 (two) times daily for 5 days. (Take nirmatrelvir 150 mg two tablets twice daily for 5 days and ritonavir 100 mg one tablet twice daily for 5 days) Patient GFR is 87  Dispense: 30 tablet; Refill: 0  Patient with multiple risk factors for complicated course of illness. Discussed risks/benefits of antiviral medications including most common potential ADRs. Patient voiced understanding and would like to proceed with antiviral medication. They are candidate for Paxlovid with recent GFR > 80. Rx sent to pharmacy. Supportive measures, OTC medications and vitamin regimen reviewed. Tessalon per orders. Patient has been enrolled in a MyChart COVID symptom monitoring program. Samule Dry reviewed in detail. Strict ER precautions discussed with patient.   Follow Up Instructions: I discussed the assessment and treatment plan with the patient. The patient was provided an opportunity to ask questions and all were answered. The patient agreed with the plan and demonstrated an understanding of the instructions.  A copy of instructions were sent to the patient via MyChart unless otherwise noted below.   The patient was advised to  call back or seek an in-person evaluation if the symptoms worsen or if the condition fails to improve as anticipated.  Time:  I spent 8 minutes with the patient via telehealth technology discussing the above problems/concerns.    [name], PA-C

## 2021-10-05 ENCOUNTER — Ambulatory Visit: Payer: 59 | Admitting: Physical Therapy

## 2021-10-11 DIAGNOSIS — M7712 Lateral epicondylitis, left elbow: Secondary | ICD-10-CM | POA: Diagnosis not present

## 2021-10-14 ENCOUNTER — Telehealth: Payer: 59 | Admitting: Family Medicine

## 2021-10-14 ENCOUNTER — Other Ambulatory Visit (HOSPITAL_COMMUNITY): Payer: Self-pay

## 2021-10-14 DIAGNOSIS — J019 Acute sinusitis, unspecified: Secondary | ICD-10-CM

## 2021-10-14 DIAGNOSIS — B9689 Other specified bacterial agents as the cause of diseases classified elsewhere: Secondary | ICD-10-CM | POA: Diagnosis not present

## 2021-10-14 MED ORDER — PREDNISONE 10 MG (21) PO TBPK
ORAL_TABLET | ORAL | 0 refills | Status: DC
  Filled 2021-10-14: qty 21, 6d supply, fill #0

## 2021-10-14 MED ORDER — AMOXICILLIN-POT CLAVULANATE 875-125 MG PO TABS
1.0000 | ORAL_TABLET | Freq: Two times a day (BID) | ORAL | 0 refills | Status: DC
  Filled 2021-10-14: qty 20, 10d supply, fill #0

## 2021-10-14 NOTE — Patient Instructions (Signed)

## 2021-10-14 NOTE — Progress Notes (Signed)
Virtual Visit Consent   Sean Cochran, you are scheduled for a virtual visit with a [facility] provider today. Just as with appointments in the office, your consent must be obtained to participate. Your consent will be active for this visit and any virtual visit you may have with one of our providers in the next 365 days. If you have a MyChart account, a copy of this consent can be sent to you electronically.  As this is a virtual visit, video technology does not allow for your provider to perform a traditional examination. This may limit your provider's ability to fully assess your condition. If your provider identifies any concerns that need to be evaluated in person or the need to arrange testing (such as labs, EKG, etc.), we will make arrangements to do so. Although advances in technology are sophisticated, we cannot ensure that it will always work on either your end or our end. If the connection with a video visit is poor, the visit may have to be switched to a telephone visit. With either a video or telephone visit, we are not always able to ensure that we have a secure connection.  By engaging in this virtual visit, you consent to the provision of healthcare and authorize for your insurance to be billed (if applicable) for the services provided during this visit. Depending on your insurance coverage, you may receive a charge related to this service.  I need to obtain your verbal consent now. Are you willing to proceed with your visit today? Sean Cochran has provided verbal consent on 10/14/2021 for a virtual visit (video or telephone). Dellia Nims, FNP  Date: 10/14/2021 10:23 AM  Virtual Visit via Video Note   I, Dellia Nims, connected with  Sean Cochran  ([id number], 11-02-1960) on 10/14/21 at 10:15 AM EDT by a video-enabled telemedicine application and verified that I am speaking with the correct person using two identifiers.  Location: Patient: Virtual Visit Location Patient:  Home Provider: Virtual Visit Location Provider: Home Office   I discussed the limitations of evaluation and management by telemedicine and the availability of in person appointments. The patient expressed understanding and agreed to proceed.    History of Present Illness: Sean Cochran is a 61 y.o. who identifies as a male who was assigned male at birth, and is being seen today for sinus pressure and pain a week after covid and taking paxlovid. Also has post nasal drainage, no fever, using tessalon for cough. Also says he usually needs a steroid pack to take with antibiotic for sinuses. Marland Kitchen  HPI: HPI  Problems:  Patient Active Problem List   Diagnosis Date Noted   Allergic rhinitis 65/46/5035   Nonalcoholic steatohepatitis (NASH) 06/17/2018   Eczema 12/31/2016   Routine general medical examination at a health care facility 06/26/2016   Abnormal finding on MRI of brain 10/04/2014   Hyperlipidemia LDL goal <160 05/08/2013    Allergies: No Known Allergies Medications:  Current Outpatient Medications:    albuterol (VENTOLIN HFA) 108 (90 Base) MCG/ACT inhaler, Inhale 2 puffs into the lungs every 6 (six) hours as needed for wheezing or shortness of breath., Disp: 8.5 g, Rfl: 0   benzonatate (TESSALON) 100 MG capsule, Take 1 capsule (100 mg total) by mouth 3 (three) times daily as needed for cough., Disp: 30 capsule, Rfl: 0   finasteride (PROPECIA) 1 MG tablet, Take 1 tablet by mouth once a day, Disp: 90 tablet, Rfl: 3  Observations/Objective: Patient is well-developed, well-nourished  in no acute distress.  Resting comfortably  at home.  Head is normocephalic, atraumatic.  No labored breathing.  Speech is clear and coherent with logical content.  Patient is alert and oriented at baseline.    Assessment and Plan: 1. Acute bacterial sinusitis  Increase fluids, rest, med use and side effects discussed, recommended probiotic with augmentin. Urgent care for worsening sx.   Follow Up  Instructions: I discussed the assessment and treatment plan with the patient. The patient was provided an opportunity to ask questions and all were answered. The patient agreed with the plan and demonstrated an understanding of the instructions.  A copy of instructions were sent to the patient via MyChart unless otherwise noted below.     The patient was advised to call back or seek an in-person evaluation if the symptoms worsen or if the condition fails to improve as anticipated.  Time:  I spent 10 minutes with the patient via telehealth technology discussing the above problems/concerns.    Dellia Nims, FNP

## 2021-10-18 ENCOUNTER — Encounter: Payer: Self-pay | Admitting: Physical Therapy

## 2021-10-18 ENCOUNTER — Other Ambulatory Visit: Payer: Self-pay

## 2021-10-18 ENCOUNTER — Ambulatory Visit: Payer: 59 | Attending: Orthopedic Surgery | Admitting: Physical Therapy

## 2021-10-18 DIAGNOSIS — M25522 Pain in left elbow: Secondary | ICD-10-CM | POA: Diagnosis not present

## 2021-10-18 DIAGNOSIS — M6281 Muscle weakness (generalized): Secondary | ICD-10-CM | POA: Insufficient documentation

## 2021-10-18 NOTE — Therapy (Signed)
OUTPATIENT PHYSICAL THERAPY ELBOW EVALUATION, POST-OP   Patient Name: Sean Cochran MRN: [id number] DOB:18-Apr-1960, 61 y.o., male Today's Date: 10/18/2021   PT End of Session - 10/18/21 1350     Visit Number 1    Date for PT Re-Evaluation 12/13/21    Authorization Type UMR    Authorization Time Period medical review after 25 visits    Authorization - Visit Number 1    Authorization - Number of Visits 25    PT Start Time 1791    PT Stop Time 1100    PT Time Calculation (min) 45 min    Activity Tolerance Patient tolerated treatment well    Behavior During Therapy [facility] for tasks assessed/performed             Past Medical History:  Diagnosis Date   Abnormal finding on MRI of brain 10/04/2014   Back pain    Colon polyp 02/19/2013   Dr. Collene Mares - Repeat in 5 years   Constipation    Hip pain    History of pineal cyst    Hyperlipidemia    Memory loss    Past Surgical History:  Procedure Laterality Date   BICEPS TENDON REPAIR Left    COLONOSCOPY     ROTATOR CUFF REPAIR Right    TENNIS ELBOW RELEASE/NIRSCHEL PROCEDURE Left 08/31/2021   Procedure: TENNIS ELBOW RELEASE/NIRSCHEL PROCEDURE;  Surgeon: Marchia Bond, MD;  Location: [facility];  Service: Orthopedics;  Laterality: Left;   TONSILLECTOMY     VASECTOMY     Patient Active Problem List   Diagnosis Date Noted   Allergic rhinitis 50/56/9794   Nonalcoholic steatohepatitis (NASH) 06/17/2018   Eczema 12/31/2016   Routine general medical examination at a health care facility 06/26/2016   Abnormal finding on MRI of brain 10/04/2014   Hyperlipidemia LDL goal <160 05/08/2013      REFERRING PROVIDER: Marchia Bond, MD   REFERRING DIAG: M77.10 (ICD-10-CM) - Tennis elbow (post-op release surgery 08/31/21)  THERAPY DIAG:  Pain in left elbow  Muscle weakness (generalized)  Rationale for Evaluation and Treatment Rehabilitation  ONSET DATE: surgery 08/31/21 release for tennis elbow  SUBJECTIVE:                                                                                                                                                                                       SUBJECTIVE STATEMENT: Pt is status post Lt elbow release surgery for tennis elbow.  He is nearly 7 weeks post-op with surgical date 08/31/21.  Pt is Lt handed and plays tennis.  Symptoms started in Dec 2022, continued to play through Spring with some exercises from MD.  Symptoms became severe in April after a match.   Pt has sleeve and counterforce brace he used to try avoiding symptoms as they worsened but not helpful.   Pt had a cast x 2 weeks, now has wrist brace to avoid extreme stretches that he can use as needed in community.  He does not wear brace at home.    PERTINENT HISTORY: Lt elbow release 08/31/21 Lt bicep tendon repair at elbow for partial tear 2014 Rt RC surgery 2001   PAIN:  PAIN:  Are you having pain? No NPRS scale: 0/10 Pain location: lateral elbow Pain orientation: Left and Lateral  PAIN TYPE: occasional soreness and tenderness Pain description: intermittent  Aggravating factors: if I bump elbow Relieving factors: n/a   PRECAUTIONS: Other: Pt is s/p tennis elbow release surgery performed 6/29  WEIGHT BEARING RESTRICTIONS No  FALLS:  Has patient fallen in last 6 months? No  LIVING ENVIRONMENT: Lives with: lives with their family Lives in: House/apartment Has following equipment at home: None  OCCUPATION: Full time work as Chartered certified accountant at [employer]  return to yoga, tennis  OBJECTIVE:   DIAGNOSTIC FINDINGS:  N/A  PATIENT SURVEYS:  FOTO 59, goal 52  COGNITION:  Overall cognitive status: Within functional limits for tasks assessed     SENSATION: WFL  POSTURE: WNL  UPPER EXTREMITY ROM:   All UE A/ROM full and painfree, symmetrical bil  UPPER EXTREMITY MMT:  Bil UE strength 4+/5 shoulders, Lt 4+/5 elbow, 5/5 wrists  Grip  squeeze: Rt 96lb, Lt 61lb    PALPATION:  Non-tender with good scar mobility   TODAY'S TREATMENT:  Discussion of return to activity, avoid high rep activities, use pain as guide for exercise progression, use of counter force brace as needed during therex   PATIENT EDUCATION: Education details: Access Code: [email or web address] Person educated: Patient Education method: Explanation, Demonstration, Verbal cues, and Handouts Education comprehension: verbalized understanding and returned demonstration   HOME EXERCISE PROGRAM: Access Code: [email or web address] URL: [email or web address] Date: 10/18/2021 Prepared by: Venetia Night Rayla Pember  Exercises - Seated Gripping Towel  - 3 x daily - 7 x weekly - 1 sets - 5 reps - 10 hold - Seated Wrist Flexion and Extension with Towel Twist  - 3 x daily - 7 x weekly - 1 sets - 10 reps - Seated Eccentric Wrist Extension  - 3 x daily - 7 x weekly - 1 sets - 10 reps - Seated Eccentric Wrist Flexion with Dumbbell  - 3 x daily - 7 x weekly - 1 sets - 10 reps - Seated Single Arm Elbow Flexion with Dumbbell  - 3 x daily - 7 x weekly - 1 sets - 10 reps - Standing Row with Anchored Resistance  - 3 x daily - 7 x weekly - 1 sets - 10 reps - Standing Tricep Extensions with Resistance  - 3 x daily - 7 x weekly - 1 sets - 10 reps - Shoulder extension with resistance - Neutral  - 3 x daily - 7 x weekly - 1 sets - 10 reps  ASSESSMENT:  CLINICAL IMPRESSION: Patient is a 62 y.o. male who was seen today for physical therapy evaluation and treatment for post-op treatment for Lt tennis elbow release performed nearly 7 weeks ago on 08/31/21. He currently has full A/ROM in Lt elbow and wrist and is painfree.  His goals are to return to yoga and tennis.   OBJECTIVE IMPAIRMENTS decreased strength and impaired flexibility.  ACTIVITY LIMITATIONS lifting  PARTICIPATION LIMITATIONS: community activity and sports  PERSONAL FACTORS Time since onset of injury/illness/exacerbation are  also affecting patient's functional outcome.   REHAB POTENTIAL: Excellent  CLINICAL DECISION MAKING: Stable/uncomplicated  EVALUATION COMPLEXITY: Low   GOALS: Goals reviewed with patient? Yes  SHORT TERM GOALS: Target date: 11/15/21  Pt will be ind with initial HEP without exacerbation of pain. Baseline: Goal status: INITIAL   LONG TERM GOALS: Target date: 12/13/21  Pt will be ind with advanced HEP and understand how to safely progress Baseline:  Goal status: INITIAL  2.  Pt will be able to perform heavy activities around the house and at work without elbow pain Baseline:  Goal status: INITIAL  3.  Pt will achieve 5/5 strength of shoulder and elbow on Lt to allow full return to activity. Baseline:  Goal status: INITIAL  4.  Pt will be able to play tennis for up to 20 min without pain Baseline:  Goal status: INITIAL  5.  Pt will return to yoga without pain or restriction Baseline:  Goal status: INITIAL     PLAN: PT FREQUENCY: 2x/week  PT DURATION: 8 weeks  PLANNED INTERVENTIONS: Therapeutic exercises, Therapeutic activity, Neuromuscular re-education, Patient/Family education, Self Care, Joint mobilization, Dry Needling, Electrical stimulation, Cryotherapy, scar mobilization, and Ionotophoresis 77m/ml Dexamethasone  PLAN FOR NEXT SESSION: review HEP and progress as tol, use counterforce brace as needed, over time incorporate yoga WB positions and tennis swings, trunk pulleys, RC and scapular strength, grip strength   Jeanelle Dake, PT 10/18/21 1:51 PM

## 2021-10-31 ENCOUNTER — Ambulatory Visit: Payer: 59 | Admitting: Physical Therapy

## 2021-10-31 ENCOUNTER — Encounter: Payer: Self-pay | Admitting: Physical Therapy

## 2021-10-31 DIAGNOSIS — M6281 Muscle weakness (generalized): Secondary | ICD-10-CM | POA: Diagnosis not present

## 2021-10-31 DIAGNOSIS — M25522 Pain in left elbow: Secondary | ICD-10-CM

## 2021-10-31 NOTE — Therapy (Signed)
OUTPATIENT PHYSICAL THERAPY TREATMENT NOTE   Patient Name: Sean Cochran MRN: [id number] DOB:[birth year], 61 y.o., male Today's Date: 10/31/2021   REFERRING PROVIDER: Marchia Bond, MD   END OF SESSION:   PT End of Session - 10/31/21 1333     Visit Number 2    Date for PT Re-Evaluation 12/13/21    Authorization Type UMR    Authorization Time Period medical review after 25 visits    Authorization - Visit Number 2    Authorization - Number of Visits 25    PT Start Time 1100    PT Stop Time 1141    PT Time Calculation (min) 41 min    Activity Tolerance Patient tolerated treatment well    Behavior During Therapy [facility] for tasks assessed/performed             Past Medical History:  Diagnosis Date   Abnormal finding on MRI of brain 10/04/2014   Back pain    Colon polyp 02/19/2013   Dr. Collene Mares - Repeat in 5 years   Constipation    Hip pain    History of pineal cyst    Hyperlipidemia    Memory loss    Past Surgical History:  Procedure Laterality Date   BICEPS TENDON REPAIR Left    COLONOSCOPY     ROTATOR CUFF REPAIR Right    TENNIS ELBOW RELEASE/NIRSCHEL PROCEDURE Left 08/31/2021   Procedure: TENNIS ELBOW RELEASE/NIRSCHEL PROCEDURE;  Surgeon: Marchia Bond, MD;  Location: [facility];  Service: Orthopedics;  Laterality: Left;   TONSILLECTOMY     VASECTOMY     Patient Active Problem List   Diagnosis Date Noted   Allergic rhinitis 40/98/1191   Nonalcoholic steatohepatitis (NASH) 06/17/2018   Eczema 12/31/2016   Routine general medical examination at a health care facility 06/26/2016   Abnormal finding on MRI of brain 10/04/2014   Hyperlipidemia LDL goal <160 05/08/2013    REFERRING DIAG: M77.10 (ICD-10-CM) - Tennis elbow (post-op release surgery 08/31/21)   THERAPY DIAG:  Pain in left elbow  Muscle weakness (generalized)  Rationale for Evaluation and Treatment Rehabilitation  PERTINENT HISTORY: Lt elbow release 08/31/21, Lt bicep tendon repair  at elbow for partial tear 2014, Rt RC surgery 2001  PRECAUTIONS: n/a  EVAL SUBJECTIVE:  Pt is status post Lt elbow release surgery for tennis elbow.  He is nearly 7 weeks post-op with surgical date 08/31/21.  Pt is Lt handed and plays tennis.  Symptoms started in Dec 2022, continued to play through Spring with some exercises from MD.  Symptoms became severe in April after a match.   Pt has sleeve and counterforce brace he used to try avoiding symptoms as they worsened but not helpful.   Pt had a cast x 2 weeks, now has wrist brace to avoid extreme stretches that he can use as needed in community.  He does not wear brace at home.    TODAY'S SUBJECTIVE: I did the HEP 3x/day and went three days and then the HEP started giving me random pains.  I then had to drive on a long car trip and felt soreness everywhere after loading/unloading the car.  I am paranoid and don't want to go backwards.   PAIN:  Are you having pain? No NPRS scale: 5/10 Pain location: medial and lateral elbow, UE musculature Pain orientation: Left  PAIN TYPE: sore Pain description:  sore   Aggravating factors: driving and maybe HEP Relieving factors: rest    OBJECTIVE:  DIAGNOSTIC FINDINGS:  N/A   PATIENT SURVEYS:  FOTO 59, goal 39   COGNITION:           Overall cognitive status: Within functional limits for tasks assessed                                  SENSATION: WFL   POSTURE: WNL   UPPER EXTREMITY ROM:             All UE A/ROM full and painfree, symmetrical bil   UPPER EXTREMITY MMT:            Bil UE strength 4+/5 shoulders, Lt 4+/5 elbow, 5/5 wrists            Grip squeeze: Rt 96lb, Lt 61lb      PALPATION:  Non-tender with good scar mobility             TODAY'S TREATMENT:  10/31/21: UBE L1 x 4' alt each min PT present to discuss symptoms and progress Seated Lt wrist extension and flexion, slow eccentric phase, unweighted 2x10 Seated Lt towel squeeze 70-80% effort 10x10" Lt grip squeeze 66lb  today (61lb at eval) STM scar massage and wrist extensors Lt Standing Lt wrist flexion with overpressure 2x20" Lt 1x10 green tband ER/IR  Eval: Discussion of return to activity, avoid high rep activities, use pain as guide for exercise progression, use of counter force brace as needed during therex     PATIENT EDUCATION: Education details: Access Code: [email or web address] Person educated: Patient Education method: Explanation, Demonstration, Verbal cues, and Handouts Education comprehension: verbalized understanding and returned demonstration     HOME EXERCISE PROGRAM: Access Code: [email or web address] URL: [email or web address] Date: 10/18/2021 Prepared by: Venetia Night Yovanny Coats   Exercises - Seated Gripping Towel  - 3 x daily - 7 x weekly - 1 sets - 5 reps - 10 hold - Seated Wrist Flexion and Extension with Towel Twist  - 3 x daily - 7 x weekly - 1 sets - 10 reps - Seated Eccentric Wrist Extension  - 3 x daily - 7 x weekly - 1 sets - 10 reps - Seated Eccentric Wrist Flexion with Dumbbell  - 3 x daily - 7 x weekly - 1 sets - 10 reps - Seated Single Arm Elbow Flexion with Dumbbell  - 3 x daily - 7 x weekly - 1 sets - 10 reps - Standing Row with Anchored Resistance  - 3 x daily - 7 x weekly - 1 sets - 10 reps - Standing Tricep Extensions with Resistance  - 3 x daily - 7 x weekly - 1 sets - 10 reps - Shoulder extension with resistance - Neutral  - 3 x daily - 7 x weekly - 1 sets - 10 reps   ASSESSMENT:   CLINICAL IMPRESSION: Patient reported compliance with HEP and did report some intermittent pain, which upon discussion sounds like delayed onset muscle soreness.  PT progressed Lt wrist flexion/extension stretches and added Lt shoulder RC therex.  He tolerated UBE without pain.  PT did perform STM to extensors and tendons along with scar mobilization which reduced Pt report of some "popping" he was experiencing with wrist extension therex.  Continue along POC with ongoing assessment of response to  treatment.     OBJECTIVE IMPAIRMENTS decreased strength and impaired flexibility.    ACTIVITY LIMITATIONS lifting   PARTICIPATION LIMITATIONS: community activity and sports   PERSONAL FACTORS Time since onset of injury/illness/exacerbation are also  affecting patient's functional outcome.    REHAB POTENTIAL: Excellent   CLINICAL DECISION MAKING: Stable/uncomplicated   EVALUATION COMPLEXITY: Low     GOALS: Goals reviewed with patient? Yes   SHORT TERM GOALS: Target date: 11/15/21   Pt will be ind with initial HEP without exacerbation of pain. Baseline: Goal status: ongoing     LONG TERM GOALS: Target date: 12/13/21   Pt will be ind with advanced HEP and understand how to safely progress Baseline:  Goal status: INITIAL   2.  Pt will be able to perform heavy activities around the house and at work without elbow pain Baseline:  Goal status: INITIAL   3.  Pt will achieve 5/5 strength of shoulder and elbow on Lt to allow full return to activity. Baseline:  Goal status: INITIAL   4.  Pt will be able to play tennis for up to 20 min without pain Baseline:  Goal status: INITIAL   5.  Pt will return to yoga without pain or restriction Baseline:  Goal status: INITIAL         PLAN: PT FREQUENCY: 2x/week   PT DURATION: 8 weeks   PLANNED INTERVENTIONS: Therapeutic exercises, Therapeutic activity, Neuromuscular re-education, Patient/Family education, Self Care, Joint mobilization, Dry Needling, Electrical stimulation, Cryotherapy, scar mobilization, and Ionotophoresis 63m/ml Dexamethasone   PLAN FOR NEXT SESSION: review HEP and progress as tol, use counterforce brace as needed, over time incorporate yoga WB positions and tennis swings, trunk pulleys, RC and scapular strength, grip strength   Access Code: [email or web address]: [email or web address] Date: 10/31/2021 Prepared by: JVenetia NightBeuhring  Exercises - Seated Gripping Towel  - 3 x daily - 7 x weekly - 1  sets - 5 reps - 10 hold - Seated Wrist Flexion and Extension with Towel Twist  - 3 x daily - 7 x weekly - 1 sets - 10 reps - Seated Eccentric Wrist Extension  - 3 x daily - 7 x weekly - 1 sets - 10 reps - Seated Eccentric Wrist Flexion with Dumbbell  - 3 x daily - 7 x weekly - 1 sets - 10 reps - Seated Single Arm Elbow Flexion with Dumbbell  - 3 x daily - 7 x weekly - 1 sets - 10 reps - Standing Row with Anchored Resistance  - 3 x daily - 7 x weekly - 1 sets - 10 reps - Standing Tricep Extensions with Resistance  - 3 x daily - 7 x weekly - 1 sets - 10 reps - Shoulder extension with resistance - Neutral  - 3 x daily - 7 x weekly - 1 sets - 10 reps - Standing Wrist Flexion Stretch  - 2-3 x daily - 7 x weekly - 1 sets - 2 reps - 20 hold - Standing Wrist Extension Stretch  - 2-3 x daily - 7 x weekly - 1 sets - 2 reps - 20 hold - Shoulder External Rotation with Anchored Resistance  - 1 x daily - 7 x weekly - 1 sets - 10 reps - Shoulder Internal Rotation with Resistance  - 1 x daily - 7 x weekly - 1 sets - 10 reps    Lasasha Brophy, PT 10/31/21 1:33 PM

## 2021-11-02 ENCOUNTER — Encounter: Payer: Self-pay | Admitting: Physical Therapy

## 2021-11-02 ENCOUNTER — Ambulatory Visit: Payer: 59 | Admitting: Physical Therapy

## 2021-11-02 DIAGNOSIS — M6281 Muscle weakness (generalized): Secondary | ICD-10-CM | POA: Diagnosis not present

## 2021-11-02 DIAGNOSIS — M25522 Pain in left elbow: Secondary | ICD-10-CM

## 2021-11-02 NOTE — Therapy (Signed)
OUTPATIENT PHYSICAL THERAPY TREATMENT NOTE   Patient Name: Sean Cochran MRN: [id number] DOB:Aug 18, 1960, 61 y.o., male Today's Date: 11/02/2021   REFERRING PROVIDER: Marchia Bond, MD   END OF SESSION:   PT End of Session - 11/02/21 1145     Visit Number 3    Date for PT Re-Evaluation 12/13/21    Authorization Type UMR    Authorization Time Period medical review after 25 visits    Authorization - Visit Number 3    Authorization - Number of Visits 25    PT Start Time 6226    PT Stop Time 1230    PT Time Calculation (min) 45 min    Activity Tolerance Patient tolerated treatment well    Behavior During Therapy [facility] for tasks assessed/performed              Past Medical History:  Diagnosis Date   Abnormal finding on MRI of brain 10/04/2014   Back pain    Colon polyp 02/19/2013   Dr. Collene Mares - Repeat in 5 years   Constipation    Hip pain    History of pineal cyst    Hyperlipidemia    Memory loss    Past Surgical History:  Procedure Laterality Date   BICEPS TENDON REPAIR Left    COLONOSCOPY     ROTATOR CUFF REPAIR Right    TENNIS ELBOW RELEASE/NIRSCHEL PROCEDURE Left 08/31/2021   Procedure: TENNIS ELBOW RELEASE/NIRSCHEL PROCEDURE;  Surgeon: Marchia Bond, MD;  Location: [facility];  Service: Orthopedics;  Laterality: Left;   TONSILLECTOMY     VASECTOMY     Patient Active Problem List   Diagnosis Date Noted   Allergic rhinitis 33/35/4562   Nonalcoholic steatohepatitis (NASH) 06/17/2018   Eczema 12/31/2016   Routine general medical examination at a health care facility 06/26/2016   Abnormal finding on MRI of brain 10/04/2014   Hyperlipidemia LDL goal <160 05/08/2013    REFERRING DIAG: M77.10 (ICD-10-CM) - Tennis elbow (post-op release surgery 08/31/21)   THERAPY DIAG:  Pain in left elbow  Muscle weakness (generalized)  Rationale for Evaluation and Treatment Rehabilitation  PERTINENT HISTORY: Lt elbow release 08/31/21, Lt bicep tendon  repair at elbow for partial tear 2014, Rt RC surgery 2001  PRECAUTIONS: n/a  EVAL SUBJECTIVE:  Pt is status post Lt elbow release surgery for tennis elbow.  He is nearly 7 weeks post-op with surgical date 08/31/21.  Pt is Lt handed and plays tennis.  Symptoms started in Dec 2022, continued to play through Spring with some exercises from MD.  Symptoms became severe in April after a match.   Pt has sleeve and counterforce brace he used to try avoiding symptoms as they worsened but not helpful.   Pt had a cast x 2 weeks, now has wrist brace to avoid extreme stretches that he can use as needed in community.  He does not wear brace at home.    TODAY'S SUBJECTIVE: "I think I'm gonna make it."  I am not having much pain.  I did one round of my HEP this morning.   PAIN:  Are you having pain? No NPRS scale: 1-2/10 Pain location: medial and lateral elbow, UE musculature Pain orientation: Left  PAIN TYPE: sore Pain description:  sore   Aggravating factors: driving and maybe HEP Relieving factors: rest    OBJECTIVE:  DIAGNOSTIC FINDINGS:  N/A   PATIENT SURVEYS:  FOTO 55, goal 37   COGNITION:  Overall cognitive status: Within functional limits for tasks assessed                                  SENSATION: WFL   POSTURE: WNL   UPPER EXTREMITY ROM:             All UE A/ROM full and painfree, symmetrical bil   UPPER EXTREMITY MMT:            Bil UE strength 4+/5 shoulders, Lt 4+/5 elbow, 5/5 wrists            Grip squeeze: Rt 96lb, Lt 61lb      PALPATION:  Non-tender with good scar mobility             TODAY'S TREATMENT:  11/02/21: UBE L2 2x2' fwd/bwd, PT present to discuss HEP and symptoms Standing green band bil rows, shoulder ext, Lt shoulder ER/IR 2x10 Lt arm at side 20 ball squeezes Red plyoball toss and catch against wall x 20 Red band Lt UE D1/2 Lt UE velcro board long handle sup/pron/flex/ext 2 laps each Body blade at side and at 45 deg shoulder flexion  horiz/vertical x 20" each Lt wrist flexion/ext stretch 1x20" Ball on wall 2x20 circles Lt UE  Seated Lt wrist eccentric extension, wrist curls, wrist circles each way, 1lb 1x10 each  10/31/21: UBE L1 x 4' alt each min PT present to discuss symptoms and progress Seated Lt wrist extension and flexion, slow eccentric phase, unweighted 2x10 Seated Lt towel squeeze 70-80% effort 10x10" Lt grip squeeze 66lb today (61lb at eval) STM scar massage and wrist extensors Lt Standing Lt wrist flexion with overpressure 2x20" Lt 1x10 green tband ER/IR  Eval: Discussion of return to activity, avoid high rep activities, use pain as guide for exercise progression, use of counter force brace as needed during therex     PATIENT EDUCATION: Education details: Access Code: [email or web address] Person educated: Patient Education method: Explanation, Demonstration, Verbal cues, and Handouts Education comprehension: verbalized understanding and returned demonstration     HOME EXERCISE PROGRAM: Access Code: [email or web address] URL: [email or web address] Date: 11/02/2021 Prepared by: Venetia Night [place]  Exercises - Seated Gripping Towel  - 3 x daily - 7 x weekly - 1 sets - 5 reps - 10 hold - Seated Wrist Flexion and Extension with Towel Twist  - 3 x daily - 7 x weekly - 1 sets - 10 reps - Seated Eccentric Wrist Extension  - 3 x daily - 7 x weekly - 1 sets - 10 reps - Seated Eccentric Wrist Flexion with Dumbbell  - 3 x daily - 7 x weekly - 1 sets - 10 reps - Seated Single Arm Elbow Flexion with Dumbbell  - 3 x daily - 7 x weekly - 1 sets - 10 reps - Standing Row with Anchored Resistance  - 3 x daily - 7 x weekly - 1 sets - 10 reps - Standing Tricep Extensions with Resistance  - 3 x daily - 7 x weekly - 1 sets - 10 reps - Shoulder extension with resistance - Neutral  - 3 x daily - 7 x weekly - 1 sets - 10 reps - Standing Wrist Flexion Stretch  - 2-3 x daily - 7 x weekly - 1 sets - 2 reps - 20 hold - Standing Wrist  Extension Stretch  - 2-3 x daily - 7 x weekly - 1 sets - 2 reps - 20 hold - Shoulder  External Rotation with Anchored Resistance  - 1 x daily - 7 x weekly - 1 sets - 10 reps - Shoulder Internal Rotation with Resistance  - 1 x daily - 7 x weekly - 1 sets - 10 reps - Standing Single Arm Shoulder PNF D1 Extension with Anchored Resistance  - 1 x daily - 7 x weekly - 1 sets - 15 reps - Standing Shoulder Single Arm PNF D2 Flexion with Anchored Resistance  - 1 x daily - 7 x weekly - 1 sets - 15 reps   ASSESSMENT:   CLINICAL IMPRESSION: Patient arrived with 1-2/10 pain and continued compliance with HEP.  He was able to tolerate progression of shoulder D2/D1 patterning using red tband, wrist velcro board, ball on wall, and return to use of 1lb for wrist strength today.  He needs intermittent cueing for shoulder posture before performing reps of therex.  Gave D1/2 red band for HEP progression and encouraged Pt to add 1lb to wrist therex as tol.     OBJECTIVE IMPAIRMENTS decreased strength and impaired flexibility.    ACTIVITY LIMITATIONS lifting   PARTICIPATION LIMITATIONS: community activity and sports   PERSONAL FACTORS Time since onset of injury/illness/exacerbation are also affecting patient's functional outcome.    REHAB POTENTIAL: Excellent   CLINICAL DECISION MAKING: Stable/uncomplicated   EVALUATION COMPLEXITY: Low     GOALS: Goals reviewed with patient? Yes   SHORT TERM GOALS: Target date: 11/15/21   Pt will be ind with initial HEP without exacerbation of pain. Baseline: Goal status: met     LONG TERM GOALS: Target date: 12/13/21   Pt will be ind with advanced HEP and understand how to safely progress Baseline:  Goal status: ongoing   2.  Pt will be able to perform heavy activities around the house and at work without elbow pain Baseline:  Goal status: INITIAL   3.  Pt will achieve 5/5 strength of shoulder and elbow on Lt to allow full return to activity. Baseline:  Goal  status: INITIAL   4.  Pt will be able to play tennis for up to 20 min without pain Baseline:  Goal status: INITIAL   5.  Pt will return to yoga without pain or restriction Baseline:  Goal status: INITIAL         PLAN: PT FREQUENCY: 2x/week   PT DURATION: 8 weeks   PLANNED INTERVENTIONS: Therapeutic exercises, Therapeutic activity, Neuromuscular re-education, Patient/Family education, Self Care, Joint mobilization, Dry Needling, Electrical stimulation, Cryotherapy, scar mobilization, and Ionotophoresis 50m/ml Dexamethasone   PLAN FOR NEXT SESSION: review HEP and progress as tol, use counterforce brace as needed, over time incorporate yoga WB positions and tennis swings, trunk pulleys, RC and scapular strength, grip strength   Access Code: [email or web address]: [email or web address] Date: 10/31/2021 Prepared by: JVenetia NightBeuhring  Exercises - Seated Gripping Towel  - 3 x daily - 7 x weekly - 1 sets - 5 reps - 10 hold - Seated Wrist Flexion and Extension with Towel Twist  - 3 x daily - 7 x weekly - 1 sets - 10 reps - Seated Eccentric Wrist Extension  - 3 x daily - 7 x weekly - 1 sets - 10 reps - Seated Eccentric Wrist Flexion with Dumbbell  - 3 x daily - 7 x weekly - 1 sets - 10 reps - Seated Single Arm Elbow Flexion with Dumbbell  - 3 x daily - 7 x weekly - 1 sets - 10 reps - Standing Row with Anchored Resistance  -  3 x daily - 7 x weekly - 1 sets - 10 reps - Standing Tricep Extensions with Resistance  - 3 x daily - 7 x weekly - 1 sets - 10 reps - Shoulder extension with resistance - Neutral  - 3 x daily - 7 x weekly - 1 sets - 10 reps - Standing Wrist Flexion Stretch  - 2-3 x daily - 7 x weekly - 1 sets - 2 reps - 20 hold - Standing Wrist Extension Stretch  - 2-3 x daily - 7 x weekly - 1 sets - 2 reps - 20 hold - Shoulder External Rotation with Anchored Resistance  - 1 x daily - 7 x weekly - 1 sets - 10 reps - Shoulder Internal Rotation with Resistance  - 1 x daily - 7 x  weekly - 1 sets - 10 reps    Ezekeil Bethel, PT 11/02/21 12:30 PM

## 2021-11-03 ENCOUNTER — Other Ambulatory Visit: Payer: Self-pay

## 2021-11-03 ENCOUNTER — Telehealth: Payer: Self-pay

## 2021-11-03 DIAGNOSIS — K760 Fatty (change of) liver, not elsewhere classified: Secondary | ICD-10-CM

## 2021-11-03 NOTE — Telephone Encounter (Signed)
Follow up scheduled for 12/19/21 at 3:40 pm with Dr. Tarri Glenn. Patient notified via mychart. Lab orders placed & patient advised to have lab work completed at least two weeks prior to appointment.

## 2021-11-03 NOTE — Telephone Encounter (Signed)
-----   Message from Carl Best, RN sent at 07/26/2021 11:52 AM EDT ----- Order NASH FibroSURE. Must be completed anytime between 11/03/21 through 05/04/22. Will need a 2 week f/u to review results

## 2021-11-08 ENCOUNTER — Encounter: Payer: Self-pay | Admitting: Physical Therapy

## 2021-11-08 ENCOUNTER — Ambulatory Visit: Payer: 59 | Attending: Orthopedic Surgery | Admitting: Physical Therapy

## 2021-11-08 DIAGNOSIS — M6281 Muscle weakness (generalized): Secondary | ICD-10-CM | POA: Diagnosis not present

## 2021-11-08 DIAGNOSIS — M7712 Lateral epicondylitis, left elbow: Secondary | ICD-10-CM | POA: Diagnosis not present

## 2021-11-08 DIAGNOSIS — M25522 Pain in left elbow: Secondary | ICD-10-CM | POA: Insufficient documentation

## 2021-11-08 NOTE — Therapy (Signed)
OUTPATIENT PHYSICAL THERAPY TREATMENT NOTE   Patient Name: Sean Cochran MRN: [id number] DOB:1960/05/24, 61 y.o., male Today's Date: 11/08/2021   REFERRING PROVIDER: Marchia Bond, MD   END OF SESSION:   PT End of Session - 11/08/21 0931     Visit Number 4    Date for PT Re-Evaluation 12/13/21    Authorization Type UMR    Authorization Time Period medical review after 25 visits    Authorization - Visit Number 4    Authorization - Number of Visits 25    PT Start Time 0932    Activity Tolerance Patient tolerated treatment well    Behavior During Therapy [facility] for tasks assessed/performed               Past Medical History:  Diagnosis Date   Abnormal finding on MRI of brain 10/04/2014   Back pain    Colon polyp 02/19/2013   Dr. Collene Mares - Repeat in 5 years   Constipation    Hip pain    History of pineal cyst    Hyperlipidemia    Memory loss    Past Surgical History:  Procedure Laterality Date   BICEPS TENDON REPAIR Left    COLONOSCOPY     ROTATOR CUFF REPAIR Right    TENNIS ELBOW RELEASE/NIRSCHEL PROCEDURE Left 08/31/2021   Procedure: TENNIS ELBOW RELEASE/NIRSCHEL PROCEDURE;  Surgeon: Marchia Bond, MD;  Location: [place];  Service: Orthopedics;  Laterality: Left;   TONSILLECTOMY     VASECTOMY     Patient Active Problem List   Diagnosis Date Noted   Allergic rhinitis 46/65/9935   Nonalcoholic steatohepatitis (NASH) 06/17/2018   Eczema 12/31/2016   Routine general medical examination at a health care facility 06/26/2016   Abnormal finding on MRI of brain 10/04/2014   Hyperlipidemia LDL goal <160 05/08/2013    REFERRING DIAG: M77.10 (ICD-10-CM) - Tennis elbow (post-op release surgery 08/31/21)   THERAPY DIAG:  Pain in left elbow  Muscle weakness (generalized)  Rationale for Evaluation and Treatment Rehabilitation  PERTINENT HISTORY: Lt elbow release 08/31/21, Lt bicep tendon repair at elbow for partial tear 2014, Rt RC surgery  2001  PRECAUTIONS: n/a  EVAL SUBJECTIVE:  Pt is status post Lt elbow release surgery for tennis elbow.  He is nearly 7 weeks post-op with surgical date 08/31/21.  Pt is Lt handed and plays tennis.  Symptoms started in Dec 2022, continued to play through Spring with some exercises from MD.  Symptoms became severe in April after a match.   Pt has sleeve and counterforce brace he used to try avoiding symptoms as they worsened but not helpful.   Pt had a cast x 2 weeks, now has wrist brace to avoid extreme stretches that he can use as needed in community.  He does not wear brace at home.    TODAY'S SUBJECTIVE: I really don't have much elbow pain, just some tightness and numbness on inside of my Lt wrist.  Still doing my HEP and noted some soreness at Lt shoulder with the "draw the sword" exercise.   PAIN:  Are you having pain? No NPRS scale: 0-1/10 Pain location: medial and lateral elbow, UE musculature Pain orientation: Left  PAIN TYPE: sore Pain description:  sore   Aggravating factors: driving and maybe HEP Relieving factors: rest    OBJECTIVE:  DIAGNOSTIC FINDINGS:  N/A   PATIENT SURVEYS:  FOTO 75, goal 83   COGNITION:           Overall  cognitive status: Within functional limits for tasks assessed                                  SENSATION: WFL   POSTURE: WNL   UPPER EXTREMITY ROM:             All UE A/ROM full and painfree, symmetrical bil   UPPER EXTREMITY MMT:            Bil UE strength 4+/5 shoulders, Lt 4+/5 elbow, 5/5 wrists            Grip squeeze: Rt 96lb, Lt 61lb      PALPATION:  Non-tender with good scar mobility             TODAY'S TREATMENT:  11/08/21: UBE L2 2x2' fwd/bwd, PT present to discuss HEP and symptoms Lt wrist circles x10 each way Lt wrist ext and flexion stretch with overpressure 2x20" Lower trap setting off wall x15 from Y arms, initial TC for proper recruitment (HEP) Lower trap light green loop against wall 3-way bil  2x5 each way  (HEP) Lt UE velcro board long handle sup/pron/flex/ext 3 laps each Body blade D1/D2 on left to 90 deg only x 3 passes (TC for scapular engagement) Prone Lt shoulder ext, abd and Y x 10 each, neuro re-ed for scapular engagement(added to HEP)  11/02/21: UBE L2 2x2' fwd/bwd, PT present to discuss HEP and symptoms Standing green band bil rows, shoulder ext, Lt shoulder ER/IR 2x10 Lt arm at side 20 ball squeezes Red plyoball toss and catch against wall x 20 Red band Lt UE D1/2 Lt UE velcro board long handle sup/pron/flex/ext 2 laps each Body blade at side and at 45 deg shoulder flexion horiz/vertical x 20" each Lt wrist flexion/ext stretch 1x20" Ball on wall 2x20 circles Lt UE  Seated Lt wrist eccentric extension, wrist curls, wrist circles each way, 1lb 1x10 each  10/31/21: UBE L1 x 4' alt each min PT present to discuss symptoms and progress Seated Lt wrist extension and flexion, slow eccentric phase, unweighted 2x10 Seated Lt towel squeeze 70-80% effort 10x10" Lt grip squeeze 66lb today (61lb at eval) STM scar massage and wrist extensors Lt Standing Lt wrist flexion with overpressure 2x20" Lt 1x10 green tband ER/IR      PATIENT EDUCATION: Education details: Access Code: [email or web address] Person educated: Patient Education method: Explanation, Demonstration, Verbal cues, and Handouts Education comprehension: verbalized understanding and returned demonstration     HOME EXERCISE PROGRAM: Access Code: [email or web address] URL: [email or web address] Date: 11/08/2021 Prepared by: Venetia Night Criag Wicklund  Exercises - Seated Gripping Towel  - 3 x daily - 7 x weekly - 1 sets - 5 reps - 10 hold - Seated Wrist Flexion and Extension with Towel Twist  - 3 x daily - 7 x weekly - 1 sets - 10 reps - Seated Eccentric Wrist Extension  - 3 x daily - 7 x weekly - 1 sets - 10 reps - Seated Eccentric Wrist Flexion with Dumbbell  - 3 x daily - 7 x weekly - 1 sets - 10 reps - Seated Single Arm Elbow Flexion with  Dumbbell  - 3 x daily - 7 x weekly - 1 sets - 10 reps - Standing Row with Anchored Resistance  - 3 x daily - 7 x weekly - 1 sets - 10 reps - Standing Tricep Extensions with Resistance  - 3 x daily - 7 x weekly - 1 sets -  10 reps - Shoulder extension with resistance - Neutral  - 3 x daily - 7 x weekly - 1 sets - 10 reps - Standing Wrist Flexion Stretch  - 2-3 x daily - 7 x weekly - 1 sets - 2 reps - 20 hold - Standing Wrist Extension Stretch  - 2-3 x daily - 7 x weekly - 1 sets - 2 reps - 20 hold - Shoulder External Rotation with Anchored Resistance  - 1 x daily - 7 x weekly - 1 sets - 10 reps - Shoulder Internal Rotation with Resistance  - 1 x daily - 7 x weekly - 1 sets - 10 reps - Standing Single Arm Shoulder PNF D1 Extension with Anchored Resistance  - 1 x daily - 7 x weekly - 1 sets - 15 reps - Standing Shoulder Single Arm PNF D2 Flexion with Anchored Resistance  - 1 x daily - 7 x weekly - 1 sets - 15 reps - Low Trap Setting at [place]  - 1 x daily - 7 x weekly - 2 sets - 10 reps - Prone Shoulder Extension - Single Arm  - 1 x daily - 7 x weekly - 2 sets - 10 reps - Prone Single Arm Shoulder Horizontal Abduction with Scapular Retraction and Palm Down  - 1 x daily - 7 x weekly - 2 sets - 10 reps - Prone Single Arm Shoulder Y  - 1 x daily - 7 x weekly - 2 sets - 10 reps ASSESSMENT:   CLINICAL IMPRESSION: Pt with 0-1/10 pain in elbow.  He has started gently swinging tennis racquet at home but was concerned about some new Lt shoulder soreness.  He notes pain in Lt shoulder with draw the sword so putting on hold for now and replacing with lower and mid trap strength.  Pt with weakness in middle and lower trap so added therex for these muscles and progressed HEP to reflect these.  Pt consistenly using 1lb for wrist therex without pain or catching in forearm so discussed sprinkling in 2lb dumbbell as tol.  HEP getting lengthy so encouraged Pt to split packet into two routines, one for AM and one for PM to  get through all in single day.       OBJECTIVE IMPAIRMENTS decreased strength and impaired flexibility.    ACTIVITY LIMITATIONS lifting   PARTICIPATION LIMITATIONS: community activity and sports   PERSONAL FACTORS Time since onset of injury/illness/exacerbation are also affecting patient's functional outcome.    REHAB POTENTIAL: Excellent   CLINICAL DECISION MAKING: Stable/uncomplicated   EVALUATION COMPLEXITY: Low     GOALS: Goals reviewed with patient? Yes   SHORT TERM GOALS: Target date: 11/15/21   Pt will be ind with initial HEP without exacerbation of pain. Baseline: Goal status: met     LONG TERM GOALS: Target date: 12/13/21   Pt will be ind with advanced HEP and understand how to safely progress Baseline:  Goal status: ongoing   2.  Pt will be able to perform heavy activities around the house and at work without elbow pain Baseline:  Goal status:ongoing   3.  Pt will achieve 5/5 strength of shoulder and elbow on Lt to allow full return to activity. Baseline:  Goal status: ongoing   4.  Pt will be able to play tennis for up to 20 min without pain Baseline:  Goal status: INITIAL   5.  Pt will return to yoga without pain or restriction Baseline:  Goal status: INITIAL  PLAN: PT FREQUENCY: 2x/week   PT DURATION: 8 weeks   PLANNED INTERVENTIONS: Therapeutic exercises, Therapeutic activity, Neuromuscular re-education, Patient/Family education, Self Care, Joint mobilization, Dry Needling, Electrical stimulation, Cryotherapy, scar mobilization, and Ionotophoresis 24m/ml Dexamethasone   PLAN FOR NEXT SESSION: follow up on new middle and lower trap strengthening, eventual return to draw the sword as these muscles get stronger, tennis simulation swings, continue shoulder, wrist and elbow strength     Marshia Tropea, PT 11/08/21 10:22 AM

## 2021-11-10 ENCOUNTER — Encounter: Payer: 59 | Admitting: Physical Therapy

## 2021-11-14 ENCOUNTER — Encounter: Payer: Self-pay | Admitting: Physical Therapy

## 2021-11-14 ENCOUNTER — Ambulatory Visit: Payer: 59 | Admitting: Physical Therapy

## 2021-11-14 DIAGNOSIS — M25522 Pain in left elbow: Secondary | ICD-10-CM | POA: Diagnosis not present

## 2021-11-14 DIAGNOSIS — M6281 Muscle weakness (generalized): Secondary | ICD-10-CM

## 2021-11-14 NOTE — Therapy (Signed)
OUTPATIENT PHYSICAL THERAPY TREATMENT NOTE   Patient Name: Sean Cochran MRN: [id number] DOB:May 26, 1960, 61 y.o., male Today's Date: 11/14/2021   REFERRING PROVIDER: Marchia Bond, MD   END OF SESSION:   PT End of Session - 11/14/21 0935     Visit Number 5    Date for PT Re-Evaluation 12/13/21    Authorization Type UMR    Authorization Time Period medical review after 25 visits    Authorization - Visit Number 5    Authorization - Number of Visits 25    PT Start Time 0932    PT Stop Time 1017    PT Time Calculation (min) 45 min    Activity Tolerance Patient tolerated treatment well    Behavior During Therapy [facility] for tasks assessed/performed                Past Medical History:  Diagnosis Date   Abnormal finding on MRI of brain 10/04/2014   Back pain    Colon polyp 02/19/2013   Dr. Collene Mares - Repeat in 5 years   Constipation    Hip pain    History of pineal cyst    Hyperlipidemia    Memory loss    Past Surgical History:  Procedure Laterality Date   BICEPS TENDON REPAIR Left    COLONOSCOPY     ROTATOR CUFF REPAIR Right    TENNIS ELBOW RELEASE/NIRSCHEL PROCEDURE Left 08/31/2021   Procedure: TENNIS ELBOW RELEASE/NIRSCHEL PROCEDURE;  Surgeon: Marchia Bond, MD;  Location: [place];  Service: Orthopedics;  Laterality: Left;   TONSILLECTOMY     VASECTOMY     Patient Active Problem List   Diagnosis Date Noted   Allergic rhinitis 40/98/1191   Nonalcoholic steatohepatitis (NASH) 06/17/2018   Eczema 12/31/2016   Routine general medical examination at a health care facility 06/26/2016   Abnormal finding on MRI of brain 10/04/2014   Hyperlipidemia LDL goal <160 05/08/2013    REFERRING DIAG: M77.10 (ICD-10-CM) - Tennis elbow (post-op release surgery 08/31/21)   THERAPY DIAG:  Pain in left elbow  Muscle weakness (generalized)  Rationale for Evaluation and Treatment Rehabilitation  PERTINENT HISTORY: Lt elbow release 08/31/21, Lt bicep tendon  repair at elbow for partial tear 2014, Rt RC surgery 2001  PRECAUTIONS: n/a  EVAL SUBJECTIVE:  Pt is status post Lt elbow release surgery for tennis elbow.  He is nearly 7 weeks post-op with surgical date 08/31/21.  Pt is Lt handed and plays tennis.  Symptoms started in Dec 2022, continued to play through Spring with some exercises from MD.  Symptoms became severe in April after a match.   Pt has sleeve and counterforce brace he used to try avoiding symptoms as they worsened but not helpful.   Pt had a cast x 2 weeks, now has wrist brace to avoid extreme stretches that he can use as needed in community.  He does not wear brace at home.    TODAY'S SUBJECTIVE: Pt reports concern about numbness in palmar wrist and medial forearm. I can feel the nerve on inside of elbow roll and catch.  This is different from the original pain/surgery target.  I took a day off from my HEP and took out RC exercises just in case that is making this worse.  I still feel my Rt RC soreness a little bit.    PAIN:  Are you having pain? No NPRS scale: 4-5/10 Pain location: medial elbow Pain orientation: Left  PAIN TYPE: sore Pain description:  sore  numb Aggravating factors: driving, carrying bags Relieving factors: rest    OBJECTIVE:  DIAGNOSTIC FINDINGS:  N/A   PATIENT SURVEYS:  FOTO 59, goal 71   COGNITION:           Overall cognitive status: Within functional limits for tasks assessed                                  SENSATION: WFL   POSTURE: WNL   UPPER EXTREMITY ROM:             All UE A/ROM full and painfree, symmetrical bil   UPPER EXTREMITY MMT:            Bil UE strength 4+/5 shoulders, Lt 4+/5 elbow, 5/5 wrists            Grip squeeze: Rt 96lb, Lt 61lb      PALPATION:  Non-tender with good scar mobility             TODAY'S TREATMENT:  11/14/21: Ulnar nerve length test assessment Seated Lt wrist pronation and supination stretch with 4lb dumbbell x 30" each Standing Lt 2lb elbow  flexion in prontation and supination 1x5 each from 90 deg, 1x5 each from extended elbow Red Lt standing tricep kick back 2x10 Bent over Lt shoulder ext and horiz abd 1x10 each, TC at scapula Quadruped rocking for WB through Lt UE x 15 Quadruped bil serratus press closed chain x10, Lt serratus punch supine 4lb x 10 HEP update (pron/sup stretch and serratus punch)  11/08/21: UBE L2 2x2' fwd/bwd, PT present to discuss HEP and symptoms Lt wrist circles x10 each way Lt wrist ext and flexion stretch with overpressure 2x20" Lower trap setting off wall x15 from Y arms, initial TC for proper recruitment (HEP) Lower trap light green loop against wall 3-way bil  2x5 each way (HEP) Lt UE velcro board long handle sup/pron/flex/ext 3 laps each Body blade D1/D2 on left to 90 deg only x 3 passes (TC for scapular engagement) Prone Lt shoulder ext, abd and Y x 10 each, neuro re-ed for scapular engagement(added to HEP)  11/02/21: UBE L2 2x2' fwd/bwd, PT present to discuss HEP and symptoms Standing green band bil rows, shoulder ext, Lt shoulder ER/IR 2x10 Lt arm at side 20 ball squeezes Red plyoball toss and catch against wall x 20 Red band Lt UE D1/2 Lt UE velcro board long handle sup/pron/flex/ext 2 laps each Body blade at side and at 45 deg shoulder flexion horiz/vertical x 20" each Lt wrist flexion/ext stretch 1x20" Ball on wall 2x20 circles Lt UE  Seated Lt wrist eccentric extension, wrist curls, wrist circles each way, 1lb 1x10 each      PATIENT EDUCATION: Education details: Access Code: [email or web address] Person educated: Patient Education method: Explanation, Demonstration, Verbal cues, and Handouts Education comprehension: verbalized understanding and returned demonstration     HOME EXERCISE PROGRAM: Access Code: [email or web address] URL: [email or web address] Date: 11/14/2021 Prepared by: Venetia Night Jenniffer Vessels  Exercises - Seated Gripping Towel  - 3 x daily - 7 x weekly - 1 sets - 5 reps - 10  hold - Seated Wrist Flexion and Extension with Towel Twist  - 3 x daily - 7 x weekly - 1 sets - 10 reps - Seated Eccentric Wrist Extension  - 3 x daily - 7 x weekly - 1 sets - 10 reps - Seated Eccentric Wrist Flexion with Dumbbell  - 3 x daily - 7 x  weekly - 1 sets - 10 reps - Seated Single Arm Elbow Flexion with Dumbbell  - 3 x daily - 7 x weekly - 1 sets - 10 reps - Standing Row with Anchored Resistance  - 3 x daily - 7 x weekly - 1 sets - 10 reps - Standing Tricep Extensions with Resistance  - 3 x daily - 7 x weekly - 1 sets - 10 reps - Shoulder extension with resistance - Neutral  - 3 x daily - 7 x weekly - 1 sets - 10 reps - Standing Wrist Flexion Stretch  - 2-3 x daily - 7 x weekly - 1 sets - 2 reps - 20 hold - Standing Wrist Extension Stretch  - 2-3 x daily - 7 x weekly - 1 sets - 2 reps - 20 hold - Shoulder External Rotation with Anchored Resistance  - 1 x daily - 7 x weekly - 1 sets - 10 reps - Shoulder Internal Rotation with Resistance  - 1 x daily - 7 x weekly - 1 sets - 10 reps - Standing Single Arm Shoulder PNF D1 Extension with Anchored Resistance  - 1 x daily - 7 x weekly - 1 sets - 15 reps - Standing Shoulder Single Arm PNF D2 Flexion with Anchored Resistance  - 1 x daily - 7 x weekly - 1 sets - 15 reps - Low Trap Setting at [place]  - 1 x daily - 7 x weekly - 2 sets - 10 reps - Prone Shoulder Extension - Single Arm  - 1 x daily - 7 x weekly - 2 sets - 10 reps - Prone Single Arm Shoulder Horizontal Abduction with Scapular Retraction and Palm Down  - 1 x daily - 7 x weekly - 2 sets - 10 reps - Prone Single Arm Shoulder Y  - 1 x daily - 7 x weekly - 2 sets - 10 reps - Wrist Pronation Stretch  - 1 x daily - 7 x weekly - 2 sets - 2 reps - 20 hold - Seated Wrist Supination Stretch  - 1 x daily - 7 x weekly - 2 sets - 2 reps - 20 hold - Supine Scapular Protraction in Flexion with Dumbbells  - 1 x daily - 7 x weekly - 2 sets - 10 reps ASSESSMENT:   CLINICAL IMPRESSION: Pt with  heightened concern regarding palmar wrist and medial forearm numbness and tightness in elbow noted with driving and carrying work bag.  Pt feels ulnar nerve snap at cubital tunnel occasionally.  ULTT is negative for ulnar nerve tension.  Pt with end range tightness in sup/pron so added passive prolonged stretch for HEP.  Progressed serratus anterior strengthening in open and closed chain today.  Pt continues to be weak in RC and scap stabilizers.  Pt reported improved symptoms at elbow end of session.       OBJECTIVE IMPAIRMENTS decreased strength and impaired flexibility.    ACTIVITY LIMITATIONS lifting   PARTICIPATION LIMITATIONS: community activity and sports   PERSONAL FACTORS Time since onset of injury/illness/exacerbation are also affecting patient's functional outcome.    REHAB POTENTIAL: Excellent   CLINICAL DECISION MAKING: Stable/uncomplicated   EVALUATION COMPLEXITY: Low     GOALS: Goals reviewed with patient? Yes   SHORT TERM GOALS: Target date: 11/15/21   Pt will be ind with initial HEP without exacerbation of pain. Baseline: Goal status: met     LONG TERM GOALS: Target date: 12/13/21   Pt will be ind with advanced  HEP and understand how to safely progress Baseline:  Goal status: ongoing   2.  Pt will be able to perform heavy activities around the house and at work without elbow pain Baseline:  Goal status:ongoing   3.  Pt will achieve 5/5 strength of shoulder and elbow on Lt to allow full return to activity. Baseline:  Goal status: ongoing   4.  Pt will be able to play tennis for up to 20 min without pain Baseline:  Goal status: INITIAL   5.  Pt will return to yoga without pain or restriction Baseline:  Goal status: INITIAL     PLAN: PT FREQUENCY: 2x/week   PT DURATION: 8 weeks   PLANNED INTERVENTIONS: Therapeutic exercises, Therapeutic activity, Neuromuscular re-education, Patient/Family education, Self Care, Joint mobilization, Dry Needling,  Electrical stimulation, Cryotherapy, scar mobilization, and Ionotophoresis 33m/ml Dexamethasone   PLAN FOR NEXT SESSION: follow up on new middle and lower trap strengthening, eventual return to draw the sword as these muscles get stronger, tennis simulation swings, continue shoulder, wrist and elbow strength     Erminio Nygard, PT 11/14/21 11:43 AM

## 2021-11-17 ENCOUNTER — Encounter: Payer: 59 | Admitting: Physical Therapy

## 2021-11-20 ENCOUNTER — Other Ambulatory Visit (HOSPITAL_COMMUNITY): Payer: Self-pay

## 2021-11-21 ENCOUNTER — Ambulatory Visit: Payer: 59 | Admitting: Physical Therapy

## 2021-11-21 ENCOUNTER — Other Ambulatory Visit (HOSPITAL_COMMUNITY): Payer: Self-pay

## 2021-11-21 DIAGNOSIS — M6281 Muscle weakness (generalized): Secondary | ICD-10-CM | POA: Diagnosis not present

## 2021-11-21 DIAGNOSIS — M25522 Pain in left elbow: Secondary | ICD-10-CM | POA: Diagnosis not present

## 2021-11-21 NOTE — Therapy (Signed)
OUTPATIENT PHYSICAL THERAPY TREATMENT NOTE   Patient Name: Sean Cochran MRN: [id number] DOB:10-27-1960, 61 y.o., male Today's Date: 11/21/2021   REFERRING PROVIDER: Marchia Bond, MD   END OF SESSION:   PT End of Session - 11/21/21 0931     Visit Number 6    Date for PT Re-Evaluation 12/13/21    Authorization Type UMR    Authorization Time Period medical review after 25 visits    Authorization - Visit Number 6    Authorization - Number of Visits 25    PT Start Time [phone]    PT Stop Time 8299    PT Time Calculation (min) 43 min                Past Medical History:  Diagnosis Date   Abnormal finding on MRI of brain 10/04/2014   Back pain    Colon polyp 02/19/2013   Dr. Collene Mares - Repeat in 5 years   Constipation    Hip pain    History of pineal cyst    Hyperlipidemia    Memory loss    Past Surgical History:  Procedure Laterality Date   BICEPS TENDON REPAIR Left    COLONOSCOPY     ROTATOR CUFF REPAIR Right    TENNIS ELBOW RELEASE/NIRSCHEL PROCEDURE Left 08/31/2021   Procedure: TENNIS ELBOW RELEASE/NIRSCHEL PROCEDURE;  Surgeon: Marchia Bond, MD;  Location: [facility];  Service: Orthopedics;  Laterality: Left;   TONSILLECTOMY     VASECTOMY     Patient Active Problem List   Diagnosis Date Noted   Allergic rhinitis 37/16/9678   Nonalcoholic steatohepatitis (NASH) 06/17/2018   Eczema 12/31/2016   Routine general medical examination at a health care facility 06/26/2016   Abnormal finding on MRI of brain 10/04/2014   Hyperlipidemia LDL goal <160 05/08/2013    REFERRING DIAG: M77.10 (ICD-10-CM) - Tennis elbow (post-op release surgery 08/31/21)   THERAPY DIAG:  Pain in left elbow  Muscle weakness (generalized)  Rationale for Evaluation and Treatment Rehabilitation  PERTINENT HISTORY: Lt elbow release 08/31/21, Lt bicep tendon repair at elbow for partial tear 2014, Rt RC surgery 2001  PRECAUTIONS: n/a  EVAL SUBJECTIVE:  Pt is status post  Lt elbow release surgery for tennis elbow.  He is nearly 7 weeks post-op with surgical date 08/31/21.  Pt is Lt handed and plays tennis.  Symptoms started in Dec 2022, continued to play through [place] with some exercises from [place].  Symptoms became severe in April after a match.   Pt has sleeve and counterforce brace he used to try avoiding symptoms as they worsened but not helpful.   Pt had a cast x 2 weeks, now has wrist brace to avoid extreme stretches that he can use as needed in community.  He does not wear brace at home.    TODAY'S SUBJECTIVE: I turned a corner maybe.  I think PT is helping and I need to continue.  A little numbness in wrist, and nerve in elbow especially in AM.   PAIN:  Are you having pain? No NPRS scale:  average pain 3-4/10 Pain location: medial elbow Pain orientation: Left  PAIN TYPE: sore Pain description:  sore  numb Aggravating factors: driving, carrying bags Relieving factors: rest    OBJECTIVE:  DIAGNOSTIC FINDINGS:  N/A   PATIENT SURVEYS:  FOTO 59, goal 5   COGNITION:           Overall cognitive status: Within functional limits for tasks assessed  SENSATION: WFL   POSTURE: WNL   UPPER EXTREMITY ROM:             All UE A/ROM full and painfree, symmetrical bil   UPPER EXTREMITY MMT:            Bil UE strength 4+/5 shoulders, Lt 4+/5 elbow, 5/5 wrists            Grip squeeze: Rt 96lb, Lt 61lb      PALPATION:  Non-tender with good scar mobility             TODAY'S TREATMENT:  11/21/21: Review of current HEP and discussion of progression by number of reps or increased resistance gradually Discussion of sleeping positions (pt tends to end up on left side) discussed avoidance of compression to left elbow and to keep wrist neutral instead of flexed Standing at the wall light green band around hands wall slides with lift off at the top 10x Green band row, external rotation and press overhead next to ears  10x Standing ulnar nerve flossing 10x Standing radial nerve flossing 10x Prone Ws to engage middle and lower traps 10x Prone Ys thumbs up for lower trap strengthening 10x  11/14/21: Ulnar nerve length test assessment Seated Lt wrist pronation and supination stretch with 4lb dumbbell x 30" each Standing Lt 2lb elbow flexion in prontation and supination 1x5 each from 90 deg, 1x5 each from extended elbow Red Lt standing tricep kick back 2x10 Bent over Lt shoulder ext and horiz abd 1x10 each, TC at scapula Quadruped rocking for WB through Lt UE x 15 Quadruped bil serratus press closed chain x10, Lt serratus punch supine 4lb x 10 HEP update (pron/sup stretch and serratus punch)  11/08/21: UBE L2 2x2' fwd/bwd, PT present to discuss HEP and symptoms Lt wrist circles x10 each way Lt wrist ext and flexion stretch with overpressure 2x20" Lower trap setting off wall x15 from Y arms, initial TC for proper recruitment (HEP) Lower trap light green loop against wall 3-way bil  2x5 each way (HEP) Lt UE velcro board long handle sup/pron/flex/ext 3 laps each Body blade D1/D2 on left to 90 deg only x 3 passes (TC for scapular engagement) Prone Lt shoulder ext, abd and Y x 10 each, neuro re-ed for scapular engagement(added to HEP)     PATIENT EDUCATION: Education details: Access Code: [email or web address] Person educated: Patient Education method: Explanation, Demonstration, Verbal cues, and Handouts Education comprehension: verbalized understanding and returned demonstration     HOME EXERCISE PROGRAM: Access Code: [email or web address]  URL: [email or web address] Date: 11/21/2021 Prepared by: Ruben Im  Exercises - Seated Gripping Towel  - 3 x daily - 7 x weekly - 1 sets - 5 reps - 10 hold - Seated Wrist Flexion and Extension with Towel Twist  - 3 x daily - 7 x weekly - 1 sets - 10 reps - Seated Eccentric Wrist Extension  - 3 x daily - 7 x weekly - 1 sets - 10 reps - Seated Eccentric Wrist Flexion  with Dumbbell  - 3 x daily - 7 x weekly - 1 sets - 10 reps - Seated Single Arm Elbow Flexion with Dumbbell  - 3 x daily - 7 x weekly - 1 sets - 10 reps - Standing Row with Anchored Resistance  - 3 x daily - 7 x weekly - 1 sets - 10 reps - Standing Tricep Extensions with Resistance  - 3 x daily - 7 x weekly - 1 sets - 10 reps - Shoulder extension  with resistance - Neutral  - 3 x daily - 7 x weekly - 1 sets - 10 reps - Standing Wrist Flexion Stretch  - 2-3 x daily - 7 x weekly - 1 sets - 2 reps - 20 hold - Standing Wrist Extension Stretch  - 2-3 x daily - 7 x weekly - 1 sets - 2 reps - 20 hold - Shoulder External Rotation with Anchored Resistance  - 1 x daily - 7 x weekly - 1 sets - 10 reps - Shoulder Internal Rotation with Resistance  - 1 x daily - 7 x weekly - 1 sets - 10 reps - Standing Single Arm Shoulder PNF D1 Extension with Anchored Resistance  - 1 x daily - 7 x weekly - 1 sets - 15 reps - Standing Shoulder Single Arm PNF D2 Flexion with Anchored Resistance  - 1 x daily - 7 x weekly - 1 sets - 15 reps - Low Trap Setting at [place]  - 1 x daily - 7 x weekly - 2 sets - 10 reps - Prone Shoulder Extension - Single Arm  - 1 x daily - 7 x weekly - 2 sets - 10 reps - Prone Single Arm Shoulder Horizontal Abduction with Scapular Retraction and Palm Down  - 1 x daily - 7 x weekly - 2 sets - 10 reps - Prone Single Arm Shoulder Y  - 1 x daily - 7 x weekly - 2 sets - 10 reps - Wrist Pronation Stretch  - 1 x daily - 7 x weekly - 2 sets - 2 reps - 20 hold - Seated Wrist Supination Stretch  - 1 x daily - 7 x weekly - 2 sets - 2 reps - 20 hold - Supine Scapular Protraction in Flexion with Dumbbells  - 1 x daily - 7 x weekly - 2 sets - 10 reps - Ulnar Nerve Flossing  - 1 x daily - 7 x weekly - 1 sets - 10 reps - Radial Nerve Flossing  - 1 x daily - 7 x weekly - 1 sets - 10 reps - Prone W Scapular Retraction  - 1 x daily - 7 x weekly - 1 sets - 10 reps - Prone Scapular Retraction Y  - 1 x daily - 7 x weekly - 1  sets - 10 reps - Prone Lower Trapezius with Legs Straight on Swiss Ball  - 1 x daily - 7 x weekly - 1 sets - 10 reps - Shoulder External Rotation in Abduction with Anchored Resistance  - 1 x daily - 7 x weekly - 1 sets - 10 reps URL: [email or web address] Date: 11/14/2021 Prepared by: Venetia Night Beuhring  ASSESSMENT:   CLINICAL IMPRESSION: Progression and update of HEP to help with return to ADLs including tennis.  Initiated neural flossing (no numbness) and progressed middle and lower trap strengthening needed to avoid overuse of distal musculature.  Verbal and tactile cues for proper recruitment of middle and lower trap muscles.     OBJECTIVE IMPAIRMENTS decreased strength and impaired flexibility.    ACTIVITY LIMITATIONS lifting   PARTICIPATION LIMITATIONS: community activity and sports   PERSONAL FACTORS Time since onset of injury/illness/exacerbation are also affecting patient's functional outcome.    REHAB POTENTIAL: Excellent   CLINICAL DECISION MAKING: Stable/uncomplicated   EVALUATION COMPLEXITY: Low     GOALS: Goals reviewed with patient? Yes   SHORT TERM GOALS: Target date: 11/15/21   Pt will be ind with initial HEP without exacerbation of pain. Baseline: Goal status:  met     LONG TERM GOALS: Target date: 12/13/21   Pt will be ind with advanced HEP and understand how to safely progress Baseline:  Goal status: ongoing   2.  Pt will be able to perform heavy activities around the house and at work without elbow pain Baseline:  Goal status:ongoing   3.  Pt will achieve 5/5 strength of shoulder and elbow on Lt to allow full return to activity. Baseline:  Goal status: ongoing   4.  Pt will be able to play tennis for up to 20 min without pain Baseline:  Goal status: INITIAL   5.  Pt will return to yoga without pain or restriction Baseline:  Goal status: INITIAL     PLAN: PT FREQUENCY: 2x/week   PT DURATION: 8 weeks   PLANNED  INTERVENTIONS: Therapeutic exercises, Therapeutic activity, Neuromuscular re-education, Patient/Family education, Self Care, Joint mobilization, Dry Needling, Electrical stimulation, Cryotherapy, scar mobilization, and Ionotophoresis 16m/ml Dexamethasone   PLAN FOR NEXT SESSION: follow up after work trip;  follow up on new middle and lower trap strengthening, see how neural flossing went;  eventual return to draw the sword as these muscles get stronger, tennis simulation swings, continue shoulder, wrist and elbow strength  SRuben Im PT 11/21/21 11:51 AM Phone: [phone]: [phone]

## 2021-11-23 ENCOUNTER — Other Ambulatory Visit (HOSPITAL_COMMUNITY): Payer: Self-pay

## 2021-11-24 ENCOUNTER — Other Ambulatory Visit: Payer: 59

## 2021-11-24 ENCOUNTER — Encounter: Payer: 59 | Admitting: Physical Therapy

## 2021-11-24 DIAGNOSIS — K760 Fatty (change of) liver, not elsewhere classified: Secondary | ICD-10-CM

## 2021-11-27 LAB — NASH FIBROSURE(R) PLUS
ALPHA 2-MACROGLOBULINS, QN: 141 mg/dL (ref 110–276)
ALT (SGPT) P5P: 26 IU/L (ref 0–55)
AST (SGOT) P5P: 26 IU/L (ref 0–40)
Apolipoprotein A-1: 148 mg/dL (ref 101–178)
Bilirubin, Total: 0.8 mg/dL (ref 0.0–1.2)
Cholesterol, Total: 270 mg/dL — ABNORMAL HIGH (ref 100–199)
Fibrosis Score: 0.43 — ABNORMAL HIGH (ref 0.00–0.21)
GGT: 119 IU/L — ABNORMAL HIGH (ref 0–65)
Glucose: 95 mg/dL (ref 70–99)
Haptoglobin: 62 mg/dL (ref 29–370)
NASH Score: 0.54 — ABNORMAL HIGH (ref 0.00–0.25)
Steatosis Score: 0.66 — ABNORMAL HIGH (ref 0.00–0.40)
Triglycerides: 157 mg/dL — ABNORMAL HIGH (ref 0–149)

## 2021-12-07 ENCOUNTER — Ambulatory Visit: Payer: 59 | Attending: Orthopedic Surgery | Admitting: Physical Therapy

## 2021-12-07 ENCOUNTER — Encounter: Payer: Self-pay | Admitting: Physical Therapy

## 2021-12-07 DIAGNOSIS — M6281 Muscle weakness (generalized): Secondary | ICD-10-CM | POA: Insufficient documentation

## 2021-12-07 DIAGNOSIS — M25522 Pain in left elbow: Secondary | ICD-10-CM | POA: Diagnosis not present

## 2021-12-07 NOTE — Patient Instructions (Signed)
  Standing: 2x10 Tricep stretch overhead with overpressure - 3x20" holds Wrist flex down stretch with overpressure - 3x20" holds Bicep curls forward and reverse with dumbbell Bent over tricep kickback with dumbbell Standing row blue Standing diagonal high/low chop double green band with both hands, diagonal lift low/high with single green band (don't lock trunk/hips, turn head with hands) Elbow field goal with red band (on days you don't do rotator cuff) Green Band rotator cuff (alternate with field goal day) Counter push ups Overhead press with dumbbells  On your stomach: 2 circuits of 10 Arms at side palm presses Ws Ys  Sitting: 2 circuits of 10 Wrist curls Wrist extension Supination/pronation reps then hold stretch 30"

## 2021-12-07 NOTE — Therapy (Signed)
OUTPATIENT PHYSICAL THERAPY TREATMENT NOTE   Patient Name: Sean Cochran MRN: [id number] DOB:[birth year], 61 y.o., male Today's Date: 12/07/2021   REFERRING PROVIDER: Marchia Bond, MD   END OF SESSION:   PT End of Session - 12/07/21 1450     Visit Number 7    Date for PT Re-Evaluation 12/13/21    Authorization Type UMR    Authorization Time Period medical review after 25 visits    Authorization - Visit Number 7    Authorization - Number of Visits 25    PT Start Time 1450    PT Stop Time 7829    PT Time Calculation (min) 53 min    Activity Tolerance Patient tolerated treatment well    Behavior During Therapy [facility] for tasks assessed/performed                Past Medical History:  Diagnosis Date   Abnormal finding on MRI of brain 10/04/2014   Back pain    Colon polyp 02/19/2013   Dr. Collene Mares - Repeat in 5 years   Constipation    Hip pain    History of pineal cyst    Hyperlipidemia    Memory loss    Past Surgical History:  Procedure Laterality Date   BICEPS TENDON REPAIR Left    COLONOSCOPY     ROTATOR CUFF REPAIR Right    TENNIS ELBOW RELEASE/NIRSCHEL PROCEDURE Left 08/31/2021   Procedure: TENNIS ELBOW RELEASE/NIRSCHEL PROCEDURE;  Surgeon: Marchia Bond, MD;  Location: [facility];  Service: Orthopedics;  Laterality: Left;   TONSILLECTOMY     VASECTOMY     Patient Active Problem List   Diagnosis Date Noted   Allergic rhinitis 56/21/3086   Nonalcoholic steatohepatitis (NASH) 06/17/2018   Eczema 12/31/2016   Routine general medical examination at a health care facility 06/26/2016   Abnormal finding on MRI of brain 10/04/2014   Hyperlipidemia LDL goal <160 05/08/2013    REFERRING DIAG: M77.10 (ICD-10-CM) - Tennis elbow (post-op release surgery 08/31/21)   THERAPY DIAG:  Pain in left elbow  Muscle weakness (generalized)  Rationale for Evaluation and Treatment Rehabilitation  PERTINENT HISTORY: Lt elbow release 08/31/21, Lt bicep tendon  repair at elbow for partial tear 2014, Rt RC surgery 2001  PRECAUTIONS: n/a  EVAL SUBJECTIVE:  Pt is status post Lt elbow release surgery for tennis elbow.  He is nearly 7 weeks post-op with surgical date 08/31/21.  Pt is Lt handed and plays tennis.  Symptoms started in Dec 2022, continued to play through Spring with some exercises from MD.  Symptoms became severe in April after a match.   Pt has sleeve and counterforce brace he used to try avoiding symptoms as they worsened but not helpful.   Pt had a cast x 2 weeks, now has wrist brace to avoid extreme stretches that he can use as needed in community.  He does not wear brace at home.    TODAY'S SUBJECTIVE: I have been traveling for work and did my HEP as much as I could.  I think the nerve flossing is helping.  I am tender along inside of elbow where nerve travels and feel tight at tricep insertion.  I am wondering if I need to revamp my HEP a bit to increase the challenge and maybe go to every other day?   PAIN:  Are you having pain? No NPRS scale:  average pain 2/10 Pain location: medial elbow Pain orientation: Left  PAIN TYPE: tender medial elbow, tight  posterior elbow Pain description:  sore  numb Aggravating factors: driving, carrying bags Relieving factors: rest    OBJECTIVE:  DIAGNOSTIC FINDINGS:  N/A   PATIENT SURVEYS:  FOTO 69% FOTO 59, goal 85   COGNITION:           Overall cognitive status: Within functional limits for tasks assessed                                  SENSATION: WFL   POSTURE: WNL   UPPER EXTREMITY ROM:             All UE A/ROM full and painfree, symmetrical bil   UPPER EXTREMITY MMT:            Bil UE strength 5/5 shoulders, Lt 4+/5 elbow, 5/5 wrists            Grip squeeze: Rt 96lb, Lt 61lb      PALPATION:  Non-tender with good scar mobility             TODAY'S TREATMENT:  12/07/21: Muscle group testing from scapula to wrist: 4+/5 to 5/5 entire Lt UE Limited end range flexibility of  tricep, pronators, wrist extensors on Lt Updated HEP to every other day, with idea of super sets and every other day program, increased band resistance for some therex: Standing: 2x10 Tricep stretch overhead with overpressure - 3x20" holds Wrist flex down stretch with overpressure - 3x20" holds Bicep curls forward and reverse with dumbbell Bent over tricep kickback with dumbbell Standing row blue Standing diagonal high/low chop double green band with both hands, diagonal lift low/high with single green band (don't lock trunk/hips, turn head with hands) Elbow field goal with red band (on days you don't do rotator cuff) Green Band rotator cuff (alternate with field goal day) Counter push ups Overhead press with dumbbells   On your stomach: 2 circuits of 10 Arms at side palm presses Ws Ys   Sitting: 2 circuits of 10 Wrist curls Wrist extension Supination/pronation reps then hold stretch 30"  11/21/21: Review of current HEP and discussion of progression by number of reps or increased resistance gradually Discussion of sleeping positions (pt tends to end up on left side) discussed avoidance of compression to left elbow and to keep wrist neutral instead of flexed Standing at the wall light green band around hands wall slides with lift off at the top 10x Green band row, external rotation and press overhead next to ears 10x Standing ulnar nerve flossing 10x Standing radial nerve flossing 10x Prone Ws to engage middle and lower traps 10x Prone Ys thumbs up for lower trap strengthening 10x  11/14/21: Ulnar nerve length test assessment Seated Lt wrist pronation and supination stretch with 4lb dumbbell x 30" each Standing Lt 2lb elbow flexion in prontation and supination 1x5 each from 90 deg, 1x5 each from extended elbow Red Lt standing tricep kick back 2x10 Bent over Lt shoulder ext and horiz abd 1x10 each, TC at scapula Quadruped rocking for WB through Lt UE x 15 Quadruped bil serratus  press closed chain x10, Lt serratus punch supine 4lb x 10 HEP update (pron/sup stretch and serratus punch)  11/08/21: UBE L2 2x2' fwd/bwd, PT present to discuss HEP and symptoms Lt wrist circles x10 each way Lt wrist ext and flexion stretch with overpressure 2x20" Lower trap setting off wall x15 from Y arms, initial TC for proper recruitment (HEP) Lower trap light  green loop against wall 3-way bil  2x5 each way (HEP) Lt UE velcro board long handle sup/pron/flex/ext 3 laps each Body blade D1/D2 on left to 90 deg only x 3 passes (TC for scapular engagement) Prone Lt shoulder ext, abd and Y x 10 each, neuro re-ed for scapular engagement(added to HEP)     PATIENT EDUCATION: Education details: Access Code: [email or web address] Person educated: Patient Education method: Consulting civil engineer, Demonstration, Verbal cues, and Handouts Education comprehension: verbalized understanding and returned demonstration     HOME EXERCISE PROGRAM: Progressed to list below, every other day, super sets, use overload principle for weights Standing: 2x10 Tricep stretch overhead with overpressure - 3x20" holds Wrist flex down stretch with overpressure - 3x20" holds Bicep curls forward and reverse with dumbbell Bent over tricep kickback with dumbbell Standing row blue Standing diagonal high/low chop double green band with both hands, diagonal lift low/high with single green band (don't lock trunk/hips, turn head with hands) Elbow field goal with red band (on days you don't do rotator cuff) Green Band rotator cuff (alternate with field goal day) Counter push ups Overhead press with dumbbells   On your stomach: 2 circuits of 10 Arms at side palm presses Ws Ys   Sitting: 2 circuits of 10 Wrist curls Wrist extension Supination/pronation reps then hold stretch 30"  ASSESSMENT:   CLINICAL IMPRESSION: Pt has made signif progress on UE strength and measures 4+ to 5/5 for all muscle groups in Lt UE.  He has end range  flexibility restrictions for Lt tricep, pronators and wrist extensors.  Stretches and HEP updated today to progress strength with more reps/resistance to prep for return to tennis.  Pt has ongoing medial elbow soreness and posterior elbow tightness for which he is using neural flossing and stretching.  HEP to be performed every other day for a recover day.  PT encouraged Pt to bring tennis racquet next week to work on functional sport swings at varied speeds    [place] decreased strength and impaired flexibility.    ACTIVITY LIMITATIONS lifting   PARTICIPATION LIMITATIONS: community activity and sports   PERSONAL FACTORS Time since onset of injury/illness/exacerbation are also affecting patient's functional outcome.    REHAB POTENTIAL: Excellent   CLINICAL DECISION MAKING: Stable/uncomplicated   EVALUATION COMPLEXITY: Low     GOALS: Goals reviewed with patient? Yes   SHORT TERM GOALS: Target date: 11/15/21   Pt will be ind with initial HEP without exacerbation of pain. Baseline: Goal status: met     LONG TERM GOALS: Target date: 12/13/21   Pt will be ind with advanced HEP and understand how to safely progress Baseline:  Goal status: ongoing   2.  Pt will be able to perform heavy activities around the house and at work without elbow pain Baseline:  Goal status:ongoing   3.  Pt will achieve 5/5 strength of shoulder and elbow on Lt to allow full return to activity. Baseline: met for shoulder Goal status: ongoing   4.  Pt will be able to play tennis for up to 20 min without pain Baseline:  Goal status: INITIAL   5.  Pt will return to yoga without pain or restriction Baseline:  Goal status: INITIAL     PLAN: PT FREQUENCY: 2x/week   PT DURATION: 8 weeks   PLANNED INTERVENTIONS: Therapeutic exercises, Therapeutic activity, Neuromuscular re-education, Patient/Family education, Self Care, Joint mobilization, Dry Needling, Electrical stimulation, Cryotherapy,  scar mobilization, and Ionotophoresis 43m/ml Dexamethasone   PLAN FOR NEXT SESSION: ERO with  tapered extension, f/u on listed HEP from last visit, work on progressive speed swings with racquet, test grip strength  Deashia Soule, PT 12/07/21 3:53 PM  Phone: [phone] Fax: [phone]

## 2021-12-12 ENCOUNTER — Encounter: Payer: Self-pay | Admitting: Physical Therapy

## 2021-12-12 ENCOUNTER — Ambulatory Visit: Payer: 59 | Admitting: Physical Therapy

## 2021-12-12 DIAGNOSIS — M25522 Pain in left elbow: Secondary | ICD-10-CM

## 2021-12-12 DIAGNOSIS — M6281 Muscle weakness (generalized): Secondary | ICD-10-CM | POA: Diagnosis not present

## 2021-12-12 NOTE — Therapy (Signed)
OUTPATIENT PHYSICAL THERAPY TREATMENT NOTE   Patient Name: Sean Cochran MRN: [id number] DOB:1961/01/17, 61 y.o., male Today's Date: 12/12/2021   REFERRING PROVIDER: Marchia Bond, MD   END OF SESSION:   PT End of Session - 12/12/21 0800     Visit Number 8    Date for PT Re-Evaluation 02/06/22    Authorization Type [employer]    Authorization Time Period medical review after 25 visits    Authorization - Visit Number 8    Authorization - Number of Visits 25    PT Start Time 0800    PT Stop Time 0844    PT Time Calculation (min) 44 min    Activity Tolerance Patient tolerated treatment well    Behavior During Therapy [facility] for tasks assessed/performed                 Past Medical History:  Diagnosis Date   Abnormal finding on MRI of brain 10/04/2014   Back pain    Colon polyp 02/19/2013   Dr. Collene Mares - Repeat in 5 years   Constipation    Hip pain    History of pineal cyst    Hyperlipidemia    Memory loss    Past Surgical History:  Procedure Laterality Date   BICEPS TENDON REPAIR Left    COLONOSCOPY     ROTATOR CUFF REPAIR Right    TENNIS ELBOW RELEASE/NIRSCHEL PROCEDURE Left 08/31/2021   Procedure: TENNIS ELBOW RELEASE/NIRSCHEL PROCEDURE;  Surgeon: Marchia Bond, MD;  Location: [place];  Service: Orthopedics;  Laterality: Left;   TONSILLECTOMY     VASECTOMY     Patient Active Problem List   Diagnosis Date Noted   Allergic rhinitis 33/29/5188   Nonalcoholic steatohepatitis (NASH) 06/17/2018   Eczema 12/31/2016   Routine general medical examination at a health care facility 06/26/2016   Abnormal finding on MRI of brain 10/04/2014   Hyperlipidemia LDL goal <160 05/08/2013    REFERRING DIAG: M77.10 (ICD-10-CM) - Tennis elbow (post-op release surgery 08/31/21)   THERAPY DIAG:  Pain in left elbow  Muscle weakness (generalized)  Rationale for Evaluation and Treatment Rehabilitation  PERTINENT HISTORY: Lt elbow release 08/31/21, Lt bicep  tendon repair at elbow for partial tear 2014, Rt RC surgery 2001  PRECAUTIONS: n/a  EVAL SUBJECTIVE:  Pt is status post Lt elbow release surgery for tennis elbow.  He is nearly 7 weeks post-op with surgical date 08/31/21.  Pt is Lt handed and plays tennis.  Symptoms started in Dec 2022, continued to play through Spring with some exercises from MD.  Symptoms became severe in April after a match.   Pt has sleeve and counterforce brace he used to try avoiding symptoms as they worsened but not helpful.   Pt had a cast x 2 weeks, now has wrist brace to avoid extreme stretches that he can use as needed in community.  He does not wear brace at home.    TODAY'S SUBJECTIVE:  I am absorbing the new HEP well.  I can tell we are on the right track.  I still haven't played any tennis.  I am back to doing everything else without pain.  Overall improvement 80-90% for elbow pain but still hope to get back to tennis.   PAIN:  Are you having pain? No NPRS scale:  average pain 0-1/10 Pain location: medial elbow Pain orientation: Left  PAIN TYPE: tender medial elbow, tight posterior elbow Pain description:  sore  numb Aggravating factors: driving, carrying bags  Relieving factors: rest    OBJECTIVE:  DIAGNOSTIC FINDINGS:  N/A   PATIENT SURVEYS:  12/12/21: FOTO 69%, met goal Eval: FOTO 59, goal 21   COGNITION:           Overall cognitive status: Within functional limits for tasks assessed                                  SENSATION: WFL   POSTURE: WNL   UPPER EXTREMITY ROM:             10/10: end range restriction noted Lt elbow supination and wrist flexion compared to Rt   UPPER EXTREMITY MMT:            12/12/21:  Bil UE strength 5/5  Grip squeeze: Rt 105lb, Lt 95lb  Eval:  Bil UE strength 5/5 shoulders, Lt 4+/5 elbow, 5/5 wrists            Grip squeeze: Rt 96lb, Lt 61lb      PALPATION:  Non-tender with good scar mobility             TODAY'S TREATMENT:  12/12/21: UBE L4 2x2 PT  present to review goals Velcro board Lt wrist long handle 3 passes sup/pron/flex/ext each Towel twists x 10 each for ext/flex  Super set: counter push up and 5lb overhead press 2x10 each 5lb 2x10 bicep curls and reverse curls 5lb 2x10 bent over tricep kickbacks Double green band chop and single green band diagonal lift 2x10 each, bil Pt education: return to tennis drills only mild-mod level of swing, no overhead serves yet, 20-30 min sessions 1-2x/week for next month. Return to yoga poses as tolerated  12/07/21: Muscle group testing from scapula to wrist: 4+/5 to 5/5 entire Lt UE Limited end range flexibility of tricep, pronators, wrist extensors on Lt Updated HEP to every other day, with idea of super sets and every other day program, increased band resistance for some therex: Standing: 2x10 Tricep stretch overhead with overpressure - 3x20" holds Wrist flex down stretch with overpressure - 3x20" holds Bicep curls forward and reverse with dumbbell Bent over tricep kickback with dumbbell Standing row blue Standing diagonal high/low chop double green band with both hands, diagonal lift low/high with single green band (don't lock trunk/hips, turn head with hands) Elbow field goal with red band (on days you don't do rotator cuff) Green Band rotator cuff (alternate with field goal day) Counter push ups Overhead press with dumbbells   On your stomach: 2 circuits of 10 Arms at side palm presses Ws Ys   Sitting: 2 circuits of 10 Wrist curls Wrist extension Supination/pronation reps then hold stretch 30"  11/21/21: Review of current HEP and discussion of progression by number of reps or increased resistance gradually Discussion of sleeping positions (pt tends to end up on left side) discussed avoidance of compression to left elbow and to keep wrist neutral instead of flexed Standing at the wall light green band around hands wall slides with lift off at the top 10x Green band row,  external rotation and press overhead next to ears 10x Standing ulnar nerve flossing 10x Standing radial nerve flossing 10x Prone Ws to engage middle and lower traps 10x Prone Ys thumbs up for lower trap strengthening 10     PATIENT EDUCATION: Education details: Access Code: [email or web address] Person educated: Patient Education method: Explanation, Demonstration, Verbal cues, and Handouts Education comprehension: verbalized understanding and returned demonstration  HOME EXERCISE PROGRAM: Progressed to list below, every other day, super sets, use overload principle for weights Standing: 2x10 Tricep stretch overhead with overpressure - 3x20" holds Wrist flex down stretch with overpressure - 3x20" holds Bicep curls forward and reverse with dumbbell Bent over tricep kickback with dumbbell Standing row blue Standing diagonal high/low chop double green band with both hands, diagonal lift low/high with single green band (don't lock trunk/hips, turn head with hands) Elbow field goal with red band (on days you don't do rotator cuff) Green Band rotator cuff (alternate with field goal day) Counter push ups Overhead press with dumbbells   On your stomach: 2 circuits of 10 Arms at side palm presses Ws Ys   Sitting: 2 circuits of 10 Wrist curls Wrist extension Supination/pronation reps then hold stretch 30"  ASSESSMENT:   CLINICAL IMPRESSION: Pt has made signif progress on UE strength, ROM and function.  Met FOTO goal at 69% last week.  5/5 strength throughout UE at this time.  Mild end range restriction present in elbow supination and wrist flexion which Pt has stretches for.  He has met all goals with exception of return to tennis and yoga which he does at home on his own.  PT plan is to guide tennis drills with return to play doing light swings followed by light drop hit progressing with speed over time as tol.  Weight bearing with counter planks and quadruped rocking have been well tol so  cleared to return to yoga as tol.  For tennis specifically: return to tennis drills only mild-mod level of swing, no overhead serves yet, 20-30 min sessions 1-2x/week for next month.  Pt has lesson with tennis pro this Friday.  Will plan to see Pt weekly x 2 more weeks then taper to every other week to guide return to sport.    OBJECTIVE IMPAIRMENTS decreased strength and impaired flexibility.    ACTIVITY LIMITATIONS lifting   PARTICIPATION LIMITATIONS: community activity and sports   PERSONAL FACTORS Time since onset of injury/illness/exacerbation are also affecting patient's functional outcome.    REHAB POTENTIAL: Excellent   CLINICAL DECISION MAKING: Stable/uncomplicated   EVALUATION COMPLEXITY: Low     GOALS: Goals reviewed with patient? Yes   SHORT TERM GOALS: Target date: 11/15/21   Pt will be ind with initial HEP without exacerbation of pain. Baseline: Goal status: met     LONG TERM GOALS: Target date: 12/13/21   Pt will be ind with advanced HEP and understand how to safely progress Baseline:  Goal status: ongoing, updated structure with increased reps and resistance with every other day approach on 10/10   2.  Pt will be able to perform heavy activities around the house and at work without elbow pain Baseline:  Goal status: met   3.  Pt will achieve 5/5 strength of shoulder and elbow on Lt to allow full return to activity. Baseline: met  Goal status: met   4.  Pt will be able to play tennis for up to 20 min without pain Baseline:  Goal status: ongoing   5.  Pt will return to yoga without pain or restriction Baseline:  Goal status: ongoing     PLAN: PT FREQUENCY: 1x/week or every other week   PT DURATION: 8 weeks   PLANNED INTERVENTIONS: Therapeutic exercises, Therapeutic activity, Neuromuscular re-education, Patient/Family education, Self Care, Joint mobilization, Dry Needling, Electrical stimulation, Cryotherapy, scar mobilization, and Ionotophoresis  79m/ml Dexamethasone   PLAN FOR NEXT SESSION: guided return to tennis and  yoga, HEP revision as needed  Baruch Merl, PT 12/12/21 8:45 AM   Phone: [phone] Fax: [phone]

## 2021-12-19 ENCOUNTER — Telehealth: Payer: 59 | Admitting: Gastroenterology

## 2021-12-19 ENCOUNTER — Other Ambulatory Visit: Payer: Self-pay | Admitting: Internal Medicine

## 2021-12-19 ENCOUNTER — Encounter: Payer: Self-pay | Admitting: Gastroenterology

## 2021-12-19 VITALS — BP 124/82 | HR 83 | Ht 71.0 in | Wt 183.0 lb

## 2021-12-19 DIAGNOSIS — R748 Abnormal levels of other serum enzymes: Secondary | ICD-10-CM | POA: Diagnosis not present

## 2021-12-19 DIAGNOSIS — K760 Fatty (change of) liver, not elsewhere classified: Secondary | ICD-10-CM | POA: Diagnosis not present

## 2021-12-19 NOTE — Progress Notes (Signed)
Referring Provider: Janith Lima, MD Primary Care Physician:  Janith Lima, MD   Chief complaint:  Steatosis   IMPRESSION:  Suspected fatty liver based on FibroSURE test    - 2020: Elevated ALT 45-71, elevated GGTP    - 2020 Fibrosure: F1-F2 fibrosis, S3 steatosis (marked or severe) without significant inflammation    - 2022 Fibrosure: F1-F2 fibrosis, S2 steatosis without significant inflammation    - 2023 Fibrosure: F1-F2 fibrosis, S2-3 steatosis, for N2 moderate NASH History of rectal bleeding due to an anal fissure ANA + 1:40 in a speckled pattern    - Smooth muscle Ab negative    - LKM antibody negative History of colon polyp on colonoscopy in 2014    - two small tubular adenoma removed on colonoscopy 07/15/18    - surveillance recommended in 7 years Family history of celiac disease (niece and nephew)  Fatty liver. Reviewed FibroSURE results.  There is slightly more steatosis and overall Nash score compared to last year.  Extensive discussion reviewing each lab abnormality. No significant change. Continue treatment focusing on a healthy weight, regular exercise, and maximizing control of cholesterol abnormalities. I have advised that he avoid alcohol, get plenty of sleep, and consider drinking 2-3 cups of regular coffee every day.     PLAN: FibroSURE in 18-24 months Office visit 2 weeks after the FibroSURE to review the results Follow-up with Dr. Ronnald Ramp: elevated cholesterol of 270 and ? if he is now a candidate for treatment Surveillance in colonoscopy in 2027 Return earlier with any additional questions or concerns   HPI: Sean Cochran is a 61 y.o. who returns in follow-up for abnormal liver enzymes.  He was last seen 05/03/2020.  The interval history is obtained through the patient and review of his electronic health record.   His health has been good. He has lost 20 pounds in the last 2 years. He is walking more and eating better.  Reduced alcohol in his diet but  has not reduced it all together.   Labs 04/20/20: normal CMP with ALT 35, normal CBC NASH FibroSURE 04/20/20: F1-F2, steatosis S2  For an NO - not NASH NASH FibroSURE 11/24/21: F1-F2, steatosis S2-3  For an N2 NASH  No new complaints or concerns. He is avoiding added sugars and overall following a diet high in fresh fruits and vegetables.  He eats oatmeal every morning.  He has started playing tennis.  He recently had surgery for tennis elbow.  No ongoing GI symptoms at this time.    Past Medical History:  Diagnosis Date   Abnormal finding on MRI of brain 10/04/2014   Back pain    Colon polyp 02/19/2013   Dr. Collene Mares - Repeat in 5 years   Constipation    Hip pain    History of pineal cyst    Hyperlipidemia    Memory loss     Past Surgical History:  Procedure Laterality Date   BICEPS TENDON REPAIR Left    COLONOSCOPY     ROTATOR CUFF REPAIR Right    TENNIS ELBOW RELEASE/NIRSCHEL PROCEDURE Left 08/31/2021   Procedure: TENNIS ELBOW RELEASE/NIRSCHEL PROCEDURE;  Surgeon: Marchia Bond, MD;  Location: [facility];  Service: Orthopedics;  Laterality: Left;   TONSILLECTOMY     VASECTOMY      Current Outpatient Medications  Medication Sig Dispense Refill   albuterol (VENTOLIN HFA) 108 (90 Base) MCG/ACT inhaler Inhale 2 puffs into the lungs every 6 (six) hours as needed for  wheezing or shortness of breath. 8.5 g 0   amoxicillin-clavulanate (AUGMENTIN) 875-125 MG tablet Take 1 tablet by mouth 2 (two) times daily. 20 tablet 0   benzonatate (TESSALON) 100 MG capsule Take 1 capsule (100 mg total) by mouth 3 (three) times daily as needed for cough. 30 capsule 0   finasteride (PROPECIA) 1 MG tablet Take 1 tablet by mouth once a day 90 tablet 3   predniSONE (STERAPRED UNI-PAK 21 TAB) 10 MG (21) TBPK tablet Take as directed daily with breakfast. 21 tablet 0   No current facility-administered medications for this visit.    Allergies as of 12/19/2021   (No Known Allergies)     Family History  Problem Relation Age of Onset   Cancer Father        Lymphoma   Depression Sister    Heart attack Brother    Healthy Brother    Healthy Brother    Cancer Maternal Grandfather        Paternal Grandfather   Prostate cancer Maternal Grandfather    Prostate cancer Paternal Grandfather    Colon cancer Neg Hx    Colon polyps Neg Hx    Esophageal cancer Neg Hx    Rectal cancer Neg Hx    Stomach cancer Neg Hx     Physical Exam: General: in no acute distress 12/19/21 183 pounds 05/03/20 177 pounds General:   Alert, in NAD. No scleral icterus. No bilateral temporal wasting.  Heart:  Regular rate and rhythm; no murmurs Pulm: Clear anteriorly; no wheezing Abdomen:  Soft. Nontender. Nondistended. Normal bowel sounds. No rebound or guarding. No fluid wave.  LAD: No inguinal or umbilical LAD Extremities:  Without edema. Neurologic:  Alert and  oriented x4;  grossly normal neurologically; no asterixis or clonus. Skin: No jaundice. No palmar erythema or spider angioma.  Psych:  Alert and cooperative. Normal mood and affect.    Jean Alejos L. Tarri Glenn, MD, MPH [facility] 12/19/2021, 3:57 PM

## 2021-12-19 NOTE — Patient Instructions (Addendum)
Treatment for fatty liver usually starts with weight loss. This can be done by eating a healthy diet, limiting portion sizes and exercise. Losing weight may improve other health problems that lead to fatty liver. Typically, losing 10% of your body weight or more is recommended. But losing even 3% to 5% of your starting weight can have benefits. Weight-loss surgery or medicines also may be helpful for certain people.  No medicines have been approved to treat MAFLD/NAFLD or NASH, but a few drugs are being studied in clinical trials that may help. Hopefully, treatments will be available in the future.   Lifestyle and home remedies can make a difference with your liver health. You can:  - Lose weight. If you're overweight or obese, reduce the number of calories you eat each day and increase your physical activity to lose weight slowly. Eating fewer calories is key to losing weight and managing this disease. - Choose a healthy diet. Eat a healthy diet that's rich in fruits, vegetables and whole grains. Your health care team may suggest avoiding or limiting certain foods and drinks, such as white bread, red and processed meats, juices, and sweetened drinks. Keep track of all calories you take in. - Exercise and be more active. Aim for at least 150 minutes of exercise a week. If you're trying to lose weight, you might find that more exercise is helpful.  - High fructose corn syrup should be avoided as it has been associated with fatty liver.  - Lower your cholesterol and blood pressure. Improve your cholesterol levels and blood pressure if they are high. A healthy diet, exercise and medicines can help keep your cholesterol, triglycerides and blood pressure at healthy levels. Please discuss cholesterol treatment with with Dr. Ronnald Ramp.  - Protect your liver. Avoid things that could harm your liver health. For example, don't drink alcohol. Follow the instructions on all medicines and nonprescription drugs. Avoid  herbal supplements, as some can harm the liver. - Caffeinated coffee. Some studies suggest that coffee may benefit the liver by reducing the risk of liver diseases like NAFLD and lowering the chance of scarring. It's not yet clear how coffee may prevent liver damage. But certain compounds in coffee are thought to lower inflammation and slow scar tissue growth. If you already drink coffee, these results may make you feel better about your morning cup. But if you don't already drink coffee, this probably isn't a good reason to start. - A good night sleep helps prevent fatty liver.  - Vitamin E. In theory, vitamin E and other vitamins called antioxidants could help protect the liver by reducing or canceling out the damage caused by inflammation. But more research is needed. Some evidence suggests vitamin E supplements may be helpful for people with fatty liver who don't have type 2 diabetes. Vitamin E has been linked with a slightly increased risk of heart disease and prostate cancer so I don't routinely recommend it.  Please take any heart symptoms seriously given the risk of heart disease in the setting of fatty liver.

## 2021-12-20 ENCOUNTER — Ambulatory Visit: Payer: 59 | Admitting: Physical Therapy

## 2021-12-20 ENCOUNTER — Encounter: Payer: Self-pay | Admitting: Physical Therapy

## 2021-12-20 DIAGNOSIS — M6281 Muscle weakness (generalized): Secondary | ICD-10-CM

## 2021-12-20 DIAGNOSIS — M25522 Pain in left elbow: Secondary | ICD-10-CM

## 2021-12-20 NOTE — Therapy (Signed)
OUTPATIENT PHYSICAL THERAPY TREATMENT NOTE   Patient Name: Sean Cochran MRN: [id number] DOB:1961/01/15, 61 y.o., male Today's Date: 12/20/2021   REFERRING PROVIDER: Marchia Bond, MD   END OF SESSION:   PT End of Session - 12/20/21 0800     Visit Number 9    Date for PT Re-Evaluation 02/06/22    Authorization Type UMR    Authorization Time Period medical review after 25 visits    Authorization - Visit Number 9    Authorization - Number of Visits 25    PT Start Time 0801    PT Stop Time 0839    PT Time Calculation (min) 38 min    Activity Tolerance Patient tolerated treatment well    Behavior During Therapy [facility] for tasks assessed/performed                  Past Medical History:  Diagnosis Date   Abnormal finding on MRI of brain 10/04/2014   Back pain    Colon polyp 02/19/2013   Dr. Collene Mares - Repeat in 5 years   Constipation    Hip pain    History of pineal cyst    Hyperlipidemia    Memory loss    Past Surgical History:  Procedure Laterality Date   BICEPS TENDON REPAIR Left    COLONOSCOPY     ROTATOR CUFF REPAIR Right    TENNIS ELBOW RELEASE/NIRSCHEL PROCEDURE Left 08/31/2021   Procedure: TENNIS ELBOW RELEASE/NIRSCHEL PROCEDURE;  Surgeon: Marchia Bond, MD;  Location: [place];  Service: Orthopedics;  Laterality: Left;   TONSILLECTOMY     VASECTOMY     Patient Active Problem List   Diagnosis Date Noted   Allergic rhinitis 56/38/7564   Nonalcoholic steatohepatitis (NASH) 06/17/2018   Eczema 12/31/2016   Routine general medical examination at a health care facility 06/26/2016   Abnormal finding on MRI of brain 10/04/2014   Hyperlipidemia LDL goal <160 05/08/2013    REFERRING DIAG: M77.10 (ICD-10-CM) - Tennis elbow (post-op release surgery 08/31/21)   THERAPY DIAG:  Pain in left elbow  Muscle weakness (generalized)  Rationale for Evaluation and Treatment Rehabilitation  PERTINENT HISTORY: Lt elbow release 08/31/21, Lt bicep  tendon repair at elbow for partial tear 2014, Rt RC surgery 2001  PRECAUTIONS: n/a  EVAL SUBJECTIVE:  Pt is status post Lt elbow release surgery for tennis elbow.  He is nearly 7 weeks post-op with surgical date 08/31/21.  Pt is Lt handed and plays tennis.  Symptoms started in Dec 2022, continued to play through Spring with some exercises from MD.  Symptoms became severe in April after a match.   Pt has sleeve and counterforce brace he used to try avoiding symptoms as they worsened but not helpful.   Pt had a cast x 2 weeks, now has wrist brace to avoid extreme stretches that he can use as needed in community.  He does not wear brace at home.    TODAY'S SUBJECTIVE:  I have done 2 tennis lessons since last here.  We are focusing on technique assessment.  I have hit for 10' the first lesson and 20' the second lesson and had some soreness but nothing I wasn't surprised by.     PAIN:  Are you having pain? No NPRS scale:  average pain 0-1/10 Pain location: medial elbow Pain orientation: Left  PAIN TYPE: tender medial elbow, tight posterior elbow Pain description:  sore  numb Aggravating factors: driving, carrying bags Relieving factors: rest  OBJECTIVE:  DIAGNOSTIC FINDINGS:  N/A   PATIENT SURVEYS:  12/12/21: FOTO 69%, met goal Eval: FOTO 59, goal 4   COGNITION:           Overall cognitive status: Within functional limits for tasks assessed                                  SENSATION: WFL   POSTURE: WNL   UPPER EXTREMITY ROM:             10/10: end range restriction noted Lt elbow supination and wrist flexion compared to Rt   UPPER EXTREMITY MMT:            12/12/21:  Bil UE strength 5/5  Grip squeeze: Rt 105lb, Lt 95lb  Eval:  Bil UE strength 5/5 shoulders, Lt 4+/5 elbow, 5/5 wrists            Grip squeeze: Rt 96lb, Lt 61lb      PALPATION:  Non-tender with good scar mobility             TODAY'S TREATMENT:  12/20/21: UBE L4 3x3 PT present to discuss status and  return to tennis lessons Lt Tricep stretch overhead with overpressure - 2x20" holds Lt Wrist flex down stretch with overpressure - 2x20" holds Super set: Bicep curls forward, overhead press, reverse bicep curl with dumbbell 2x10 5lb 2x10 bent over tricep kickbacks superset with counter push ups 2x10 3lb Lt UE tennis forearm swing slo-mo 2x10 using technique from tennis pro 5lb double UE backhand swings slo-mo 2x10 using technique from tennis pro Velcro board 3 passes each pronation/supination, flexion/extension, long handle and short handle each   12/12/21: UBE L4 2x2 PT present to review goals Velcro board Lt wrist long handle 3 passes sup/pron/flex/ext each Towel twists x 10 each for ext/flex  Super set: counter push up and 5lb overhead press 2x10 each 5lb 2x10 bicep curls and reverse curls 5lb 2x10 bent over tricep kickbacks Double green band chop and single green band diagonal lift 2x10 each, bil Pt education: return to tennis drills only mild-mod level of swing, no overhead serves yet, 20-30 min sessions 1-2x/week for next month. Return to yoga poses as tolerated  12/07/21: Muscle group testing from scapula to wrist: 4+/5 to 5/5 entire Lt UE Limited end range flexibility of tricep, pronators, wrist extensors on Lt Updated HEP to every other day, with idea of super sets and every other day program, increased band resistance for some therex: Standing: 2x10 Tricep stretch overhead with overpressure - 3x20" holds Wrist flex down stretch with overpressure - 3x20" holds Bicep curls forward and reverse with dumbbell Bent over tricep kickback with dumbbell Standing row blue Standing diagonal high/low chop double green band with both hands, diagonal lift low/high with single green band (don't lock trunk/hips, turn head with hands) Elbow field goal with red band (on days you don't do rotator cuff) Green Band rotator cuff (alternate with field goal day) Counter push ups Overhead press  with dumbbells   On your stomach: 2 circuits of 10 Arms at side palm presses Ws Ys   Sitting: 2 circuits of 10 Wrist curls Wrist extension Supination/pronation reps then hold stretch 30"     PATIENT EDUCATION: Education details: Access Code: [email or web address] Person educated: Patient Education method: Explanation, Demonstration, Verbal cues, and Handouts Education comprehension: verbalized understanding and returned demonstration     HOME EXERCISE PROGRAM: Progressed to list below,  every other day, super sets, use overload principle for weights Standing: 2x10 Tricep stretch overhead with overpressure - 3x20" holds Wrist flex down stretch with overpressure - 3x20" holds Bicep curls forward and reverse with dumbbell Bent over tricep kickback with dumbbell Standing row blue Standing diagonal high/low chop double green band with both hands, diagonal lift low/high with single green band (don't lock trunk/hips, turn head with hands) Elbow field goal with red band (on days you don't do rotator cuff) Green Band rotator cuff (alternate with field goal day) Counter push ups Overhead press with dumbbells   On your stomach: 2 circuits of 10 Arms at side palm presses Ws Ys   Sitting: 2 circuits of 10 Wrist curls Wrist extension Supination/pronation reps then hold stretch 30"  ASSESSMENT:   CLINICAL IMPRESSION: Pt has returned to 2 tennis lessons with pro since last visit.  Mostly working on revising his Archivist at lessons with some light hitting.  Added dumbbell slow motion swings for forehand and backhand today to reinforce technique from pro for full body incorporation of arm/racquet into tennis swing.  Pt to return next week and then anticipate tapering to every other week.    OBJECTIVE IMPAIRMENTS decreased strength and impaired flexibility.    ACTIVITY LIMITATIONS lifting   PARTICIPATION LIMITATIONS: community activity and sports   PERSONAL FACTORS Time since  onset of injury/illness/exacerbation are also affecting patient's functional outcome.    REHAB POTENTIAL: Excellent   CLINICAL DECISION MAKING: Stable/uncomplicated   EVALUATION COMPLEXITY: Low     GOALS: Goals reviewed with patient? Yes   SHORT TERM GOALS: Target date: 11/15/21   Pt will be ind with initial HEP without exacerbation of pain. Baseline: Goal status: met     LONG TERM GOALS: Target date: 12/13/21   Pt will be ind with advanced HEP and understand how to safely progress Baseline:  Goal status: ongoing, updated structure with increased reps and resistance with every other day approach on 10/10   2.  Pt will be able to perform heavy activities around the house and at work without elbow pain Baseline:  Goal status: met   3.  Pt will achieve 5/5 strength of shoulder and elbow on Lt to allow full return to activity. Baseline: met  Goal status: met   4.  Pt will be able to play tennis for up to 20 min without pain Baseline:  Goal status: ongoing   5.  Pt will return to yoga without pain or restriction Baseline:  Goal status: ongoing     PLAN: PT FREQUENCY: 1x/week or every other week   PT DURATION: 8 weeks   PLANNED INTERVENTIONS: Therapeutic exercises, Therapeutic activity, Neuromuscular re-education, Patient/Family education, Self Care, Joint mobilization, Dry Needling, Electrical stimulation, Cryotherapy, scar mobilization, and Ionotophoresis 79m/ml Dexamethasone   PLAN FOR NEXT SESSION: guided return to tennis and yoga, HEP revision as needed  JBaruch Merl PT 12/20/21 8:39 AM     Phone: [phone]: [phone]

## 2021-12-26 ENCOUNTER — Ambulatory Visit: Payer: 59 | Admitting: Physical Therapy

## 2022-01-01 DIAGNOSIS — M7712 Lateral epicondylitis, left elbow: Secondary | ICD-10-CM | POA: Diagnosis not present

## 2022-01-09 ENCOUNTER — Encounter: Payer: Self-pay | Admitting: Physical Therapy

## 2022-01-09 ENCOUNTER — Ambulatory Visit: Payer: 59 | Attending: Orthopedic Surgery | Admitting: Physical Therapy

## 2022-01-09 DIAGNOSIS — M25522 Pain in left elbow: Secondary | ICD-10-CM | POA: Diagnosis not present

## 2022-01-09 DIAGNOSIS — M6281 Muscle weakness (generalized): Secondary | ICD-10-CM | POA: Insufficient documentation

## 2022-01-09 NOTE — Therapy (Signed)
OUTPATIENT PHYSICAL THERAPY TREATMENT NOTE   Patient Name: Sean Cochran MRN: [id number] DOB:May 28, 1960, 61 y.o., male Today's Date: 01/09/2022   REFERRING PROVIDER: Marchia Bond, MD   END OF SESSION:   PT End of Session - 01/09/22 0801     Visit Number 10    Date for PT Re-Evaluation 02/06/22    Authorization Type UMR    Authorization Time Period medical review after 25 visits    Authorization - Visit Number 10    Authorization - Number of Visits 25    PT Start Time 0802    PT Stop Time 0845    PT Time Calculation (min) 43 min    Activity Tolerance Patient tolerated treatment well    Behavior During Therapy [facility] for tasks assessed/performed                   Past Medical History:  Diagnosis Date   Abnormal finding on MRI of brain 10/04/2014   Back pain    Colon polyp 02/19/2013   Dr. Collene Mares - Repeat in 5 years   Constipation    Hip pain    History of pineal cyst    Hyperlipidemia    Memory loss    Past Surgical History:  Procedure Laterality Date   BICEPS TENDON REPAIR Left    COLONOSCOPY     ROTATOR CUFF REPAIR Right    TENNIS ELBOW RELEASE/NIRSCHEL PROCEDURE Left 08/31/2021   Procedure: TENNIS ELBOW RELEASE/NIRSCHEL PROCEDURE;  Surgeon: Marchia Bond, MD;  Location: [place];  Service: Orthopedics;  Laterality: Left;   TONSILLECTOMY     VASECTOMY     Patient Active Problem List   Diagnosis Date Noted   Allergic rhinitis 40/98/1191   Nonalcoholic steatohepatitis (NASH) 06/17/2018   Eczema 12/31/2016   Routine general medical examination at a health care facility 06/26/2016   Abnormal finding on MRI of brain 10/04/2014   Hyperlipidemia LDL goal <160 05/08/2013    REFERRING DIAG: M77.10 (ICD-10-CM) - Tennis elbow (post-op release surgery 08/31/21)   THERAPY DIAG:  Pain in left elbow  Muscle weakness (generalized)  Rationale for Evaluation and Treatment Rehabilitation  PERTINENT HISTORY: Lt elbow release 08/31/21, Lt bicep  tendon repair at elbow for partial tear 2014, Rt RC surgery 2001  PRECAUTIONS: n/a  EVAL SUBJECTIVE:  Pt is status post Lt elbow release surgery for tennis elbow.  He is nearly 7 weeks post-op with surgical date 08/31/21.  Pt is Lt handed and plays tennis.  Symptoms started in Dec 2022, continued to play through Spring with some exercises from MD.  Symptoms became severe in April after a match.   Pt has sleeve and counterforce brace he used to try avoiding symptoms as they worsened but not helpful.   Pt had a cast x 2 weeks, now has wrist brace to avoid extreme stretches that he can use as needed in community.  He does not wear brace at home.    TODAY'S SUBJECTIVE:  Have done 2 more tennis lessons, once a week, since last here.  I have no issues when I'm playing and do wear my compression sleeve.  I hit for about 20 min.  After last Sat lesson, I woke up with my arm outstretched and had some soreness. I rested until Wed and did another lesson Fri without issue.  I have noticed I have tenderness along the inside of both elbows.  I get nerve like pain from armpit into medial forearm and can get some numbness/tingling  into pinky and ring finger.     PAIN:  Are you having pain? No NPRS scale:  average pain 0-1/10 Pain location: medial elbow Pain orientation: Left  PAIN TYPE: tender medial elbow, tight posterior elbow Pain description:  sore  numb Aggravating factors: driving, carrying bags Relieving factors: rest    OBJECTIVE:  DIAGNOSTIC FINDINGS:  N/A   PATIENT SURVEYS:  12/12/21: FOTO 69%, met goal Eval: FOTO 59, goal 41   COGNITION:           Overall cognitive status: Within functional limits for tasks assessed                                  SENSATION: WFL   POSTURE: WNL  SPECIAL TESTS: 01/09/22: ULTT: + median on Lt   UPPER EXTREMITY ROM:             10/10: end range restriction noted Lt elbow supination and wrist flexion compared to Rt   UPPER EXTREMITY MMT:             12/12/21:  Bil UE strength 5/5  Grip squeeze: Rt 105lb, Lt 95lb  Eval:  Bil UE strength 5/5 shoulders, Lt 4+/5 elbow, 5/5 wrists            Grip squeeze: Rt 96lb, Lt 61lb      PALPATION:  01/09/22: trigger points present Lt posterior shoulder and upper trap Non-tender with good scar mobility             TODAY'S TREATMENT:  01/09/22: Trigger Point Dry-Needling  Treatment instructions: Expect mild to moderate muscle soreness. S/S of pneumothorax if dry needled over a lung field, and to seek immediate medical attention should they occur. Patient verbalized understanding of these instructions and education.  Patient Consent Given: Yes Education handout provided: Yes Muscles treated: Lt upper trap ant/post approach, infraspinatus, teres minor and major Electrical stimulation performed: No Parameters: N/A Treatment response/outcome: twitch and elongation of all target muscles STM to targeted DN muscles Vertical lying on foam roller median nerve flossing bil x 20 reps (HEP) Aftercare for DN verbal review  12/20/21: UBE L4 3x3 PT present to discuss status and return to tennis lessons Lt Tricep stretch overhead with overpressure - 2x20" holds Lt Wrist flex down stretch with overpressure - 2x20" holds Super set: Bicep curls forward, overhead press, reverse bicep curl with dumbbell 2x10 5lb 2x10 bent over tricep kickbacks superset with counter push ups 2x10 3lb Lt UE tennis forearm swing slo-mo 2x10 using technique from tennis pro 5lb double UE backhand swings slo-mo 2x10 using technique from tennis pro Velcro board 3 passes each pronation/supination, flexion/extension, long handle and short handle each   12/12/21: UBE L4 2x2 PT present to review goals Velcro board Lt wrist long handle 3 passes sup/pron/flex/ext each Towel twists x 10 each for ext/flex  Super set: counter push up and 5lb overhead press 2x10 each 5lb 2x10 bicep curls and reverse curls 5lb 2x10 bent over tricep  kickbacks Double green band chop and single green band diagonal lift 2x10 each, bil Pt education: return to tennis drills only mild-mod level of swing, no overhead serves yet, 20-30 min sessions 1-2x/week for next month. Return to yoga poses as tolerated  12/07/21: Muscle group testing from scapula to wrist: 4+/5 to 5/5 entire Lt UE Limited end range flexibility of tricep, pronators, wrist extensors on Lt Updated HEP to every other day, with idea of  super sets and every other day program, increased band resistance for some therex: Standing: 2x10 Tricep stretch overhead with overpressure - 3x20" holds Wrist flex down stretch with overpressure - 3x20" holds Bicep curls forward and reverse with dumbbell Bent over tricep kickback with dumbbell Standing row blue Standing diagonal high/low chop double green band with both hands, diagonal lift low/high with single green band (don't lock trunk/hips, turn head with hands) Elbow field goal with red band (on days you don't do rotator cuff) Green Band rotator cuff (alternate with field goal day) Counter push ups Overhead press with dumbbells   On your stomach: 2 circuits of 10 Arms at side palm presses Ws Ys   Sitting: 2 circuits of 10 Wrist curls Wrist extension Supination/pronation reps then hold stretch 30"     PATIENT EDUCATION: Education details: Access Code: [email or web address] Person educated: Patient Education method: Explanation, Demonstration, Verbal cues, and Handouts Education comprehension: verbalized understanding and returned demonstration     HOME EXERCISE PROGRAM: Progressed to list below, every other day, super sets, use overload principle for weights Standing: 2x10 Tricep stretch overhead with overpressure - 3x20" holds Wrist flex down stretch with overpressure - 3x20" holds Bicep curls forward and reverse with dumbbell Bent over tricep kickback with dumbbell Standing row blue Standing diagonal high/low chop double green  band with both hands, diagonal lift low/high with single green band (don't lock trunk/hips, turn head with hands) Elbow field goal with red band (on days you don't do rotator cuff) Green Band rotator cuff (alternate with field goal day) Counter push ups Overhead press with dumbbells   On your stomach: 2 circuits of 10 Arms at side palm presses Ws Ys   Sitting: 2 circuits of 10 Wrist curls Wrist extension Supination/pronation reps then hold stretch 30"  ASSESSMENT:   CLINICAL IMPRESSION: Pt has experienced some tightness, soreness, and burning from axilla to medial aspect of Lt elbow and intermittent tingling into Lt>Rt 4th and 5th digits.  He is slowly returning to tennis via lessons 1x/week x 30 min.  He has trigger points in Lt shoulder girdle and neck and + median > ulnar ULTT Lt>Rt.  Manual techniques including DN to release and open up axilla and Lt shoulder girdle performed today with good tolerance.  Instructed Pt to perform median nerve glides bil on foam roller to see if this helps symptoms as well.      OBJECTIVE IMPAIRMENTS decreased strength and impaired flexibility.    ACTIVITY LIMITATIONS lifting   PARTICIPATION LIMITATIONS: community activity and sports   PERSONAL FACTORS Time since onset of injury/illness/exacerbation are also affecting patient's functional outcome.    REHAB POTENTIAL: Excellent   CLINICAL DECISION MAKING: Stable/uncomplicated   EVALUATION COMPLEXITY: Low     GOALS: Goals reviewed with patient? Yes   SHORT TERM GOALS: Target date: 11/15/21   Pt will be ind with initial HEP without exacerbation of pain. Baseline: Goal status: met     LONG TERM GOALS: Target date: 12/13/21   Pt will be ind with advanced HEP and understand how to safely progress Baseline:  Goal status: ongoing, updated structure with increased reps and resistance with every other day approach on 10/10   2.  Pt will be able to perform heavy activities around the house  and at work without elbow pain Baseline:  Goal status: met   3.  Pt will achieve 5/5 strength of shoulder and elbow on Lt to allow full return to activity. Baseline: met  Goal status: met  4.  Pt will be able to play tennis for up to 20 min without pain Baseline:  Goal status: ongoing   5.  Pt will return to yoga without pain or restriction Baseline:  Goal status: ongoing     PLAN: PT FREQUENCY: 1x/week or every other week   PT DURATION: 8 weeks   PLANNED INTERVENTIONS: Therapeutic exercises, Therapeutic activity, Neuromuscular re-education, Patient/Family education, Self Care, Joint mobilization, Dry Needling, Electrical stimulation, Cryotherapy, scar mobilization, and Ionotophoresis 9m/ml Dexamethasone   PLAN FOR NEXT SESSION: guided return to tennis and yoga, HEP revision as needed  JBaruch Merl PT 01/09/22 1:32 PM      Phone: [phone]: [phone]

## 2022-01-16 ENCOUNTER — Encounter: Payer: 59 | Admitting: Physical Therapy

## 2022-01-22 ENCOUNTER — Other Ambulatory Visit (HOSPITAL_COMMUNITY): Payer: Self-pay

## 2022-01-22 ENCOUNTER — Other Ambulatory Visit (HOSPITAL_BASED_OUTPATIENT_CLINIC_OR_DEPARTMENT_OTHER): Payer: Self-pay

## 2022-01-23 ENCOUNTER — Ambulatory Visit: Payer: 59 | Admitting: Physical Therapy

## 2022-01-23 ENCOUNTER — Other Ambulatory Visit (HOSPITAL_BASED_OUTPATIENT_CLINIC_OR_DEPARTMENT_OTHER): Payer: Self-pay

## 2022-01-23 ENCOUNTER — Other Ambulatory Visit (HOSPITAL_COMMUNITY): Payer: Self-pay

## 2022-01-23 ENCOUNTER — Encounter: Payer: Self-pay | Admitting: Physical Therapy

## 2022-01-23 DIAGNOSIS — M6281 Muscle weakness (generalized): Secondary | ICD-10-CM

## 2022-01-23 DIAGNOSIS — M25522 Pain in left elbow: Secondary | ICD-10-CM

## 2022-01-23 MED ORDER — INFLUENZA VAC SPLIT QUAD 0.5 ML IM SUSY
PREFILLED_SYRINGE | INTRAMUSCULAR | 0 refills | Status: DC
  Filled 2022-01-23: qty 0.5, 1d supply, fill #0

## 2022-01-23 MED ORDER — CYCLOBENZAPRINE HCL 10 MG PO TABS
10.0000 mg | ORAL_TABLET | Freq: Three times a day (TID) | ORAL | 0 refills | Status: DC | PRN
Start: 2022-01-23 — End: 2023-11-20
  Filled 2022-01-23 (×2): qty 20, 7d supply, fill #0

## 2022-01-23 MED ORDER — COMIRNATY 30 MCG/0.3ML IM SUSY
PREFILLED_SYRINGE | INTRAMUSCULAR | 0 refills | Status: DC
  Filled 2022-01-23: qty 0.3, 1d supply, fill #0

## 2022-01-23 NOTE — Therapy (Signed)
OUTPATIENT PHYSICAL THERAPY TREATMENT NOTE   Patient Name: Sean Cochran MRN: [id number] DOB:Nov 18, 1960, 61 y.o., male Today's Date: 01/23/2022   REFERRING PROVIDER: Marchia Bond, MD   END OF SESSION:   PT End of Session - 01/23/22 1105     Visit Number 11    Date for PT Re-Evaluation 02/06/22    Authorization Type UMR    Authorization Time Period medical review after 25 visits    Authorization - Visit Number 11    Authorization - Number of Visits 25    PT Start Time 1104    PT Stop Time 1145    PT Time Calculation (min) 41 min    Activity Tolerance Patient tolerated treatment well    Behavior During Therapy [facility] for tasks assessed/performed                    Past Medical History:  Diagnosis Date   Abnormal finding on MRI of brain 10/04/2014   Back pain    Colon polyp 02/19/2013   Dr. Collene Mares - Repeat in 5 years   Constipation    Hip pain    History of pineal cyst    Hyperlipidemia    Memory loss    Past Surgical History:  Procedure Laterality Date   BICEPS TENDON REPAIR Left    COLONOSCOPY     ROTATOR CUFF REPAIR Right    TENNIS ELBOW RELEASE/NIRSCHEL PROCEDURE Left 08/31/2021   Procedure: TENNIS ELBOW RELEASE/NIRSCHEL PROCEDURE;  Surgeon: Marchia Bond, MD;  Location: [facility];  Service: Orthopedics;  Laterality: Left;   TONSILLECTOMY     VASECTOMY     Patient Active Problem List   Diagnosis Date Noted   Allergic rhinitis 62/13/0865   Nonalcoholic steatohepatitis (NASH) 06/17/2018   Eczema 12/31/2016   Routine general medical examination at a health care facility 06/26/2016   Abnormal finding on MRI of brain 10/04/2014   Hyperlipidemia LDL goal <160 05/08/2013    REFERRING DIAG: M77.10 (ICD-10-CM) - Tennis elbow (post-op release surgery 08/31/21)   THERAPY DIAG:  Pain in left elbow  Muscle weakness (generalized)  Rationale for Evaluation and Treatment Rehabilitation  PERTINENT HISTORY: Lt elbow release 08/31/21, Lt  bicep tendon repair at elbow for partial tear 2014, Rt RC surgery 2001  PRECAUTIONS: n/a  EVAL SUBJECTIVE:  Pt is status post Lt elbow release surgery for tennis elbow.  He is nearly 7 weeks post-op with surgical date 08/31/21.  Pt is Lt handed and plays tennis.  Symptoms started in Dec 2022, continued to play through Spring with some exercises from MD.  Symptoms became severe in April after a match.   Pt has sleeve and counterforce brace he used to try avoiding symptoms as they worsened but not helpful.   Pt had a cast x 2 weeks, now has wrist brace to avoid extreme stretches that he can use as needed in community.  He does not wear brace at home.    TODAY'S SUBJECTIVE:  I am mostly good.  I have continued to do tennis lessons, and hit with ball machine x 30 min.  I get expected sore in muscles and no pain.  I continue to balance how much I'm exercising.  I still get intermittent nerve symptoms into arm and fingers.   PAIN:  Are you having pain? No NPRS scale:  average pain 2-3/10 Pain location: medial elbow Pain orientation: Left  PAIN TYPE: tender medial elbow, tight posterior elbow Pain description:  sore  numb Aggravating  factors: driving, carrying bags Relieving factors: rest    OBJECTIVE:  DIAGNOSTIC FINDINGS:  N/A   PATIENT SURVEYS:  12/12/21: FOTO 69%, met goal Eval: FOTO 59, goal 67   COGNITION:           Overall cognitive status: Within functional limits for tasks assessed                                  SENSATION: WFL   POSTURE: WNL  SPECIAL TESTS: 01/09/22: ULTT: + median on Lt   UPPER EXTREMITY ROM:             10/10: end range restriction noted Lt elbow supination and wrist flexion compared to Rt   UPPER EXTREMITY MMT:            12/12/21:  Bil UE strength 5/5  Grip squeeze: Rt 105lb, Lt 95lb  Eval:  Bil UE strength 5/5 shoulders, Lt 4+/5 elbow, 5/5 wrists            Grip squeeze: Rt 96lb, Lt 61lb      PALPATION:  01/09/22: trigger points present  Lt posterior shoulder and upper trap Non-tender with good scar mobility             TODAY'S TREATMENT:  01/23/22 Review of lat stretch hanging in doorframe or inside of kitchen sink Trigger Point Dry-Needling  Treatment instructions: Expect mild to moderate muscle soreness. S/S of pneumothorax if dry needled over a lung field, and to seek immediate medical attention should they occur. Patient verbalized understanding of these instructions and education.  Patient Consent Given: Yes Education handout provided: Previously provided Muscles treated: Lt lat, Lt upper trap Electrical stimulation performed: No Parameters: N/A Treatment response/outcome: signif twitch and elongation   Deep tissue massage and active release Lt lat with shoulder flexion in SL Option for UE "Y" lying on foam roller vertically to stretch end range shoulder flexion  01/09/22: Trigger Point Dry-Needling  Treatment instructions: Expect mild to moderate muscle soreness. S/S of pneumothorax if dry needled over a lung field, and to seek immediate medical attention should they occur. Patient verbalized understanding of these instructions and education.  Patient Consent Given: Yes Education handout provided: Yes Muscles treated: Lt upper trap ant/post approach, infraspinatus, teres minor and major Electrical stimulation performed: No Parameters: N/A Treatment response/outcome: twitch and elongation of all target muscles STM to targeted DN muscles Vertical lying on foam roller median nerve flossing bil x 20 reps (HEP) Aftercare for DN verbal review  12/20/21: UBE L4 3x3 PT present to discuss status and return to tennis lessons Lt Tricep stretch overhead with overpressure - 2x20" holds Lt Wrist flex down stretch with overpressure - 2x20" holds Super set: Bicep curls forward, overhead press, reverse bicep curl with dumbbell 2x10 5lb 2x10 bent over tricep kickbacks superset with counter push ups 2x10 3lb Lt UE tennis  forearm swing slo-mo 2x10 using technique from tennis pro 5lb double UE backhand swings slo-mo 2x10 using technique from tennis pro Velcro board 3 passes each pronation/supination, flexion/extension, long handle and short handle each   12/12/21: UBE L4 2x2 PT present to review goals Velcro board Lt wrist long handle 3 passes sup/pron/flex/ext each Towel twists x 10 each for ext/flex  Super set: counter push up and 5lb overhead press 2x10 each 5lb 2x10 bicep curls and reverse curls 5lb 2x10 bent over tricep kickbacks Double green band chop and single green band diagonal  lift 2x10 each, bil Pt education: return to tennis drills only mild-mod level of swing, no overhead serves yet, 20-30 min sessions 1-2x/week for next month. Return to yoga poses as tolerated      PATIENT EDUCATION: Education details: Access Code: [email or web address] Person educated: Patient Education method: Explanation, Demonstration, Verbal cues, and Handouts Education comprehension: verbalized understanding and returned demonstration     HOME EXERCISE PROGRAM: Progressed to list below, every other day, super sets, use overload principle for weights Standing: 2x10 Tricep stretch overhead with overpressure - 3x20" holds Wrist flex down stretch with overpressure - 3x20" holds Bicep curls forward and reverse with dumbbell Bent over tricep kickback with dumbbell Standing row blue Standing diagonal high/low chop double green band with both hands, diagonal lift low/high with single green band (don't lock trunk/hips, turn head with hands) Elbow field goal with red band (on days you don't do rotator cuff) Green Band rotator cuff (alternate with field goal day) Counter push ups Overhead press with dumbbells   On your stomach: 2 circuits of 10 Arms at side palm presses Ws Ys   Sitting: 2 circuits of 10 Wrist curls Wrist extension Supination/pronation reps then hold stretch 30"  ASSESSMENT:   CLINICAL IMPRESSION: Pt  continues to have end range restriction in shoulder flexion and abd from tight lat.  TPDN performed with signif twitch and release.  Follow up active release to Lt lat and stretches instructed for doorway UE grasp lat hang and foam roller Y.  Improved end range Lt shoulder flexion end of session.  ERO next visit.  Pt has returned to tennis lessons and hitting with ball machine without pain.  Continued neural tension Lt UE for which he has flossing therex.    OBJECTIVE IMPAIRMENTS decreased strength and impaired flexibility.    ACTIVITY LIMITATIONS lifting   PARTICIPATION LIMITATIONS: community activity and sports   PERSONAL FACTORS Time since onset of injury/illness/exacerbation are also affecting patient's functional outcome.    REHAB POTENTIAL: Excellent   CLINICAL DECISION MAKING: Stable/uncomplicated   EVALUATION COMPLEXITY: Low     GOALS: Goals reviewed with patient? Yes   SHORT TERM GOALS: Target date: 11/15/21   Pt will be ind with initial HEP without exacerbation of pain. Baseline: Goal status: met     LONG TERM GOALS: Target date: 12/13/21   Pt will be ind with advanced HEP and understand how to safely progress Baseline:  Goal status: ongoing, updated structure with increased reps and resistance with every other day approach on 10/10   2.  Pt will be able to perform heavy activities around the house and at work without elbow pain Baseline:  Goal status: met   3.  Pt will achieve 5/5 strength of shoulder and elbow on Lt to allow full return to activity. Baseline: met  Goal status: met   4.  Pt will be able to play tennis for up to 20 min without pain Baseline:  Goal status: ongoing   5.  Pt will return to yoga without pain or restriction Baseline:  Goal status: ongoing     PLAN: PT FREQUENCY: 1x/week or every other week   PT DURATION: 8 weeks   PLANNED INTERVENTIONS: Therapeutic exercises, Therapeutic activity, Neuromuscular re-education, Patient/Family  education, Self Care, Joint mobilization, Dry Needling, Electrical stimulation, Cryotherapy, scar mobilization, and Ionotophoresis 41m/ml Dexamethasone   PLAN FOR NEXT SESSION: guided return to tennis and yoga, HEP revision as needed  JBaruch Merl PT 01/23/22 12:38 PM  Phone: [phone] Fax: [phone]

## 2022-01-30 ENCOUNTER — Encounter: Payer: 59 | Admitting: Physical Therapy

## 2022-02-01 ENCOUNTER — Ambulatory Visit: Payer: 59 | Admitting: Physical Therapy

## 2022-02-01 ENCOUNTER — Encounter: Payer: Self-pay | Admitting: Physical Therapy

## 2022-02-01 DIAGNOSIS — M6281 Muscle weakness (generalized): Secondary | ICD-10-CM | POA: Diagnosis not present

## 2022-02-01 DIAGNOSIS — M25522 Pain in left elbow: Secondary | ICD-10-CM

## 2022-02-01 NOTE — Therapy (Signed)
OUTPATIENT PHYSICAL THERAPY TREATMENT NOTE   Patient Name: Sean Cochran MRN: [id number] DOB:10/16/1960, 61 y.o., male Today's Date: 02/01/2022   REFERRING PROVIDER: Marchia Bond, MD   END OF SESSION:   PT End of Session - 02/01/22 1016     Visit Number 12    Date for PT Re-Evaluation 02/06/22    Authorization Type UMR    Authorization Time Period medical review after 25 visits    Authorization - Visit Number 12    Authorization - Number of Visits 25    PT Start Time 1017    PT Stop Time 1048    PT Time Calculation (min) 31 min    Activity Tolerance Patient tolerated treatment well    Behavior During Therapy WFL for tasks assessed/performed                     Past Medical History:  Diagnosis Date   Abnormal finding on MRI of brain 10/04/2014   Back pain    Colon polyp 02/19/2013   Dr. Collene Mares - Repeat in 5 years   Constipation    Hip pain    History of pineal cyst    Hyperlipidemia    Memory loss    Past Surgical History:  Procedure Laterality Date   BICEPS TENDON REPAIR Left    COLONOSCOPY     ROTATOR CUFF REPAIR Right    TENNIS ELBOW RELEASE/NIRSCHEL PROCEDURE Left 08/31/2021   Procedure: TENNIS ELBOW RELEASE/NIRSCHEL PROCEDURE;  Surgeon: Marchia Bond, MD;  Location: [place];  Service: Orthopedics;  Laterality: Left;   TONSILLECTOMY     VASECTOMY     Patient Active Problem List   Diagnosis Date Noted   Allergic rhinitis 51/89/8421   Nonalcoholic steatohepatitis (NASH) 06/17/2018   Eczema 12/31/2016   Routine general medical examination at a health care facility 06/26/2016   Abnormal finding on MRI of brain 10/04/2014   Hyperlipidemia LDL goal <160 05/08/2013    REFERRING DIAG: M77.10 (ICD-10-CM) - Tennis elbow (post-op release surgery 08/31/21)   THERAPY DIAG:  Pain in left elbow  Muscle weakness (generalized)  Rationale for Evaluation and Treatment Rehabilitation  PERTINENT HISTORY: Lt elbow release 08/31/21, Lt  bicep tendon repair at elbow for partial tear 2014, Rt RC surgery 2001  PRECAUTIONS: n/a  EVAL SUBJECTIVE:  Pt is status post Lt elbow release surgery for tennis elbow.  He is nearly 7 weeks post-op with surgical date 08/31/21.  Pt is Lt handed and plays tennis.  Symptoms started in Dec 2022, continued to play through Spring with some exercises from MD.  Symptoms became severe in April after a match.   Pt has sleeve and counterforce brace he used to try avoiding symptoms as they worsened but not helpful.   Pt had a cast x 2 weeks, now has wrist brace to avoid extreme stretches that he can use as needed in community.  He does not wear brace at home.    TODAY'S SUBJECTIVE:  I continue to feel like the ulnar nerve can be agitated.  I have a tennis lesson today.  Used ball machine x 30' last weekend and got sore in shoulder but was muscular due to disuse I think.  Overall Lt elbow is 100% better.  I am ready d/c.   PAIN:  Are you having pain? No NPRS scale:  average pain 0/10 Pain location: medial elbow Pain orientation: Left  PAIN TYPE: tender medial elbow, tight posterior elbow Pain description:  sore  numb Aggravating factors: driving, carrying bags Relieving factors: rest    OBJECTIVE:  DIAGNOSTIC FINDINGS:  N/A   PATIENT SURVEYS:  12/12/21: FOTO 69%, met goal Eval: FOTO 59, goal 3   COGNITION:           Overall cognitive status: Within functional limits for tasks assessed                                  SENSATION: WFL   POSTURE: WNL  SPECIAL TESTS: 01/09/22: ULTT: + median on Lt   UPPER EXTREMITY ROM:             10/10: end range restriction noted Lt elbow supination and wrist flexion compared to Rt  11/30: ULTT + for Lt median nerve tension end range, WNL ulnar nerve on Lt   UPPER EXTREMITY MMT:            11/30 Grip strength 110lb Rt, 104lb Lt  12/12/21:  Bil UE strength 5/5  Grip squeeze: Rt 105lb, Lt 95lb  Eval:  Bil UE strength 5/5 shoulders, Lt 4+/5  elbow, 5/5 wrists            Grip squeeze: Rt 96lb, Lt 61lb      PALPATION:  01/09/22: trigger points present Lt posterior shoulder and upper trap Non-tender with good scar mobility             TODAY'S TREATMENT:  02/01/22: Objective measures (see above) Review of tennis form to avoid shoulder pain (hit ball in front of you vs delayed swing) HEP verbal review for frequency, how/when to progress, focus on eccentric for control 10lb double hold UE dumbbell squat with diagonal chop/reverse chop bil in front of mirror (add to HEP) 5lb single UE squat with diag reach to snatch with overhead press (add to HEP)  01/23/22 Review of lat stretch hanging in doorframe or inside of kitchen sink Trigger Point Dry-Needling  Treatment instructions: Expect mild to moderate muscle soreness. S/S of pneumothorax if dry needled over a lung field, and to seek immediate medical attention should they occur. Patient verbalized understanding of these instructions and education.  Patient Consent Given: Yes Education handout provided: Previously provided Muscles treated: Lt lat, Lt upper trap Electrical stimulation performed: No Parameters: N/A Treatment response/outcome: signif twitch and elongation   Deep tissue massage and active release Lt lat with shoulder flexion in SL Option for UE "Y" lying on foam roller vertically to stretch end range shoulder flexion  01/09/22: Trigger Point Dry-Needling  Treatment instructions: Expect mild to moderate muscle soreness. S/S of pneumothorax if dry needled over a lung field, and to seek immediate medical attention should they occur. Patient verbalized understanding of these instructions and education.  Patient Consent Given: Yes Education handout provided: Yes Muscles treated: Lt upper trap ant/post approach, infraspinatus, teres minor and major Electrical stimulation performed: No Parameters: N/A Treatment response/outcome: twitch and elongation of all target  muscles STM to targeted DN muscles Vertical lying on foam roller median nerve flossing bil x 20 reps (HEP) Aftercare for DN verbal review    PATIENT EDUCATION: Education details: Access Code: [email or web address] Person educated: Patient Education method: Explanation, Demonstration, Verbal cues, and Handouts Education comprehension: verbalized understanding and returned demonstration     HOME EXERCISE PROGRAM: Progressed to list below, every other day, super sets, use overload principle for weights Standing: 2x10 Tricep stretch overhead with overpressure - 3x20" holds Wrist flex down stretch with  overpressure - 3x20" holds Bicep curls forward and reverse with dumbbell Bent over tricep kickback with dumbbell Standing row blue Standing diagonal high/low chop double green band with both hands, diagonal lift low/high with single green band (don't lock trunk/hips, turn head with hands) Elbow field goal with red band (on days you don't do rotator cuff) Green Band rotator cuff (alternate with field goal day) Counter push ups Overhead press with dumbbells   On your stomach: 2 circuits of 10 Arms at side palm presses Ws Ys   Sitting: 2 circuits of 10 Wrist curls Wrist extension Supination/pronation reps then hold stretch 30"  ASSESSMENT:   CLINICAL IMPRESSION: Pt has met all goals.  He reports 100% improvement in Lt elbow and has returned to tennis lessons and hitting balls with ball machine.  His grip strength has improved significantly from eval (61lb) to today (104lb).  He is ind with HEP which was finalized and progressed today to include dumbbell diagonals.  Pt is ready to d/c to HEP at this time.    OBJECTIVE IMPAIRMENTS decreased strength and impaired flexibility.    ACTIVITY LIMITATIONS lifting   PARTICIPATION LIMITATIONS: community activity and sports   PERSONAL FACTORS Time since onset of injury/illness/exacerbation are also affecting patient's functional outcome.    REHAB  POTENTIAL: Excellent   CLINICAL DECISION MAKING: Stable/uncomplicated   EVALUATION COMPLEXITY: Low     GOALS: Goals reviewed with patient? Yes   SHORT TERM GOALS: Target date: 11/15/21   Pt will be ind with initial HEP without exacerbation of pain. Baseline: Goal status: met     LONG TERM GOALS: Target date: 12/13/21   Pt will be ind with advanced HEP and understand how to safely progress Baseline:  Goal status: met   2.  Pt will be able to perform heavy activities around the house and at work without elbow pain Baseline:  Goal status: met   3.  Pt will achieve 5/5 strength of shoulder and elbow on Lt to allow full return to activity. Baseline: met  Goal status: met   4.  Pt will be able to play tennis for up to 20 min without pain Baseline:  Goal status: met   5.  Pt will return to yoga without pain or restriction Baseline:  Goal status: deferred     PLAN: PT FREQUENCY: 1x/week or every other week   PT DURATION: 8 weeks   PLANNED INTERVENTIONS: Therapeutic exercises, Therapeutic activity, Neuromuscular re-education, Patient/Family education, Self Care, Joint mobilization, Dry Needling, Electrical stimulation, Cryotherapy, scar mobilization, and Ionotophoresis 33m/ml Dexamethasone   PLAN FOR NEXT SESSION: guided return to tennis and yoga, HEP revision as needed   PHYSICAL THERAPY DISCHARGE SUMMARY  Visits from Start of Care: 12  Current functional level related to goals / functional outcomes: See above   Remaining deficits: See above   Education / Equipment: HEP   Patient agrees to discharge. Patient goals were met. Patient is being discharged due to meeting the stated rehab goals.  JVenetia NightBeuhring, PT 02/01/22 10:54 AM         Phone: [phone]: [phone]

## 2022-02-01 NOTE — Patient Instructions (Signed)
10lb double hand hold dumbbell squat with diagonal chop and reverse chop (up and down - one smooth diagonal motion) 5lb single hand dumbbell diagonal reach with square squat (no lateral movement of legs) to snatch with overhead press

## 2022-02-06 ENCOUNTER — Ambulatory Visit: Payer: 59 | Admitting: Physical Therapy

## 2022-02-13 DIAGNOSIS — R351 Nocturia: Secondary | ICD-10-CM | POA: Diagnosis not present

## 2022-02-13 DIAGNOSIS — N401 Enlarged prostate with lower urinary tract symptoms: Secondary | ICD-10-CM | POA: Diagnosis not present

## 2022-02-20 ENCOUNTER — Other Ambulatory Visit (HOSPITAL_COMMUNITY): Payer: Self-pay

## 2022-02-20 DIAGNOSIS — Z125 Encounter for screening for malignant neoplasm of prostate: Secondary | ICD-10-CM | POA: Diagnosis not present

## 2022-02-20 DIAGNOSIS — R351 Nocturia: Secondary | ICD-10-CM | POA: Diagnosis not present

## 2022-02-20 DIAGNOSIS — N401 Enlarged prostate with lower urinary tract symptoms: Secondary | ICD-10-CM | POA: Diagnosis not present

## 2022-02-20 MED ORDER — SILDENAFIL CITRATE 20 MG PO TABS
40.0000 mg | ORAL_TABLET | Freq: Every day | ORAL | 3 refills | Status: DC | PRN
  Filled 2022-02-20: qty 50, 10d supply, fill #0

## 2022-02-20 MED ORDER — FINASTERIDE 1 MG PO TABS
1.0000 mg | ORAL_TABLET | Freq: Every day | ORAL | 3 refills | Status: DC
  Filled 2022-02-20: qty 90, 90d supply, fill #0
  Filled 2022-05-21: qty 90, 90d supply, fill #1
  Filled 2022-07-31: qty 90, 90d supply, fill #2
  Filled 2022-11-27: qty 90, 90d supply, fill #0

## 2022-02-23 ENCOUNTER — Other Ambulatory Visit (HOSPITAL_COMMUNITY): Payer: Self-pay

## 2022-02-28 DIAGNOSIS — H5213 Myopia, bilateral: Secondary | ICD-10-CM | POA: Diagnosis not present

## 2022-05-22 ENCOUNTER — Other Ambulatory Visit: Payer: Self-pay

## 2022-05-22 ENCOUNTER — Other Ambulatory Visit (HOSPITAL_COMMUNITY): Payer: Self-pay

## 2022-07-24 ENCOUNTER — Ambulatory Visit (INDEPENDENT_AMBULATORY_CARE_PROVIDER_SITE_OTHER): Payer: Commercial Managed Care - PPO | Admitting: Sports Medicine

## 2022-07-24 VITALS — BP 118/78 | Ht 71.0 in | Wt 175.0 lb

## 2022-07-24 DIAGNOSIS — M545 Low back pain, unspecified: Secondary | ICD-10-CM

## 2022-07-24 DIAGNOSIS — M25551 Pain in right hip: Secondary | ICD-10-CM

## 2022-07-24 DIAGNOSIS — M778 Other enthesopathies, not elsewhere classified: Secondary | ICD-10-CM

## 2022-07-24 DIAGNOSIS — M25552 Pain in left hip: Secondary | ICD-10-CM

## 2022-07-24 NOTE — Progress Notes (Signed)
   Subjective:    Patient ID: Sean Cochran, male    DOB: [birth year], 62 y.o.   MRN: [id number]  HPI chief complaint: Left elbow pain  Sean Cochran presents today complaining of posterior left elbow pain that has been present intermittently for the past 2 months.  This is the same elbow that he had repaired by Dr. Dion Saucier.  He is 1 year status post common extensor tendon repair and has done well postoperatively.  His current pain is different than what he experienced with that.  He localizes his pain to the posterior aspect of the olecranon with some radiating discomfort into the triceps muscle.  He does notice the pain more with certain triceps movements.  He notices it somewhat playing tennis as well.  He has been wearing a compression sleeve.  He denies any weakness.    Review of Systems As above    Objective:   Physical Exam  Well-developed, well-nourished.  No acute distress  Left elbow: Full range of motion.  No effusion.  No soft tissue swelling.  There is slight tenderness to palpation along the distal triceps with reproducible pain with full extension of the elbow.  No palpable defect.  No other tenderness to palpation.  Good grip strength distally.  Limited MSK ultrasound of the posterior left elbow shows the triceps tendon to be intact without abnormality.      Assessment & Plan:   Left elbow pain secondary to triceps tendinitis  I think Sean Cochran can continue with activities using pain as his guide.  We did discuss how to eccentrically rehab the elbow.  He will continue with his compression sleeve when playing tennis as well.  On a separate note, Sean Cochran would like to try some physical therapy for some chronic low back and bilateral hip pain.  He brought MRI reports of both his lumbar spine and his pelvis for me to review.  MRI of his lumbar spine does show some diffuse degenerative changes.  MRI of his pelvis shows some bilateral greater trochanteric bursitis.  I have made a referral to  [facility] for their physical therapist to address these issues.  He will follow-up with me as needed.  This note was dictated using Dragon naturally speaking software and may contain errors in syntax, spelling, or content which have not been identified prior to signing this note.

## 2022-08-01 ENCOUNTER — Other Ambulatory Visit (HOSPITAL_COMMUNITY): Payer: Self-pay

## 2022-08-06 ENCOUNTER — Other Ambulatory Visit (HOSPITAL_COMMUNITY): Payer: Self-pay

## 2022-08-21 ENCOUNTER — Ambulatory Visit: Payer: 59 | Attending: Sports Medicine | Admitting: Physical Therapy

## 2022-08-21 ENCOUNTER — Other Ambulatory Visit: Payer: Self-pay

## 2022-08-21 ENCOUNTER — Encounter: Payer: Self-pay | Admitting: Physical Therapy

## 2022-08-21 DIAGNOSIS — M545 Low back pain, unspecified: Secondary | ICD-10-CM | POA: Insufficient documentation

## 2022-08-21 DIAGNOSIS — M25551 Pain in right hip: Secondary | ICD-10-CM | POA: Diagnosis present

## 2022-08-21 DIAGNOSIS — M25552 Pain in left hip: Secondary | ICD-10-CM | POA: Insufficient documentation

## 2022-08-21 NOTE — Therapy (Signed)
OUTPATIENT PHYSICAL THERAPY THORACOLUMBAR EVALUATION   Patient Name: Sean Cochran MRN: [id number] DOB:[birth year], 61 y.o., male Today's Date: 08/21/2022  END OF SESSION:  PT End of Session - 08/21/22 1250     Visit Number 1    Date for PT Re-Evaluation 10/16/22    Authorization Type [employer]    PT Start Time 0845    PT Stop Time 0932    PT Time Calculation (min) 47 min    Activity Tolerance Patient tolerated treatment well    Behavior During Therapy [facility] for tasks assessed/performed             Past Medical History:  Diagnosis Date   Abnormal finding on MRI of brain 10/04/2014   Back pain    Colon polyp 02/19/2013   Dr. Loreta Ave - Repeat in 5 years   Constipation    Hip pain    History of pineal cyst    Hyperlipidemia    Memory loss    Past Surgical History:  Procedure Laterality Date   BICEPS TENDON REPAIR Left    COLONOSCOPY     ROTATOR CUFF REPAIR Right    TENNIS ELBOW RELEASE/NIRSCHEL PROCEDURE Left 08/31/2021   Procedure: TENNIS ELBOW RELEASE/NIRSCHEL PROCEDURE;  Surgeon: Teryl Lucy, MD;  Location: [facility];  Service: Orthopedics;  Laterality: Left;   TONSILLECTOMY     VASECTOMY     Patient Active Problem List   Diagnosis Date Noted   Allergic rhinitis 06/20/2020   Nonalcoholic steatohepatitis (NASH) 06/17/2018   Eczema 12/31/2016   Routine general medical examination at a health care facility 06/26/2016   Abnormal finding on MRI of brain 10/04/2014   Hyperlipidemia LDL goal <160 05/08/2013    PCP: Etta Grandchild MD  REFERRING PROVIDER: Ralene Cork, DO  REFERRING DIAG: [phone] (ICD-10-CM) - Bilateral hip pain M54.50 (ICD-10-CM) - Bilateral low back pain without sciatica, unspecified chronicity  Rationale for Evaluation and Treatment: Rehabilitation  THERAPY DIAG:  Bilateral hip pain - Plan: PT plan of care cert/re-cert  Bilateral low back pain without sciatica, unspecified chronicity - Plan: PT plan of care  cert/re-cert  ONSET DATE: chronic  SUBJECTIVE:                                                                                                                                                                                           SUBJECTIVE STATEMENT: Pt has chronic LBP and bil hip pain.  "I've always had LBP."  Pain is in Lt/Rt low back "in the dents" and in the hips behind the hip bones.  About 2 years ago I started having numbness in scrotum  and did some ortho PT with some improvement but not really getting better.  If I sit on a stationary bike my groin will go numb in 15 min. My legs can go numb, Lt>Rt, typically in hamstring but can extend into back of calf. I usually do yoga on mat on floor - if I lay flat on back my legs can start going numb.  PERTINENT HISTORY:  Plays tennis   PAIN:  PAIN:  Are you having pain? Yes NPRS scale: 2-8/10 Pain location: Rt/Lt lumbar and bil hips laterally behind greater trochanters Pain orientation: Bilateral  PAIN TYPE: aching, sharp, and tight Pain description: constant  Aggravating factors: sitting, driving Relieving factors: movement, maybe yoga   PRECAUTIONS: None  WEIGHT BEARING RESTRICTIONS: No  FALLS:  Has patient fallen in last 6 months? No  LIVING ENVIRONMENT: Lives with: lives with their spouse Lives in: House/apartment   OCCUPATION: works for [employer] - sitting or driving  PLOF: Independent  PATIENT GOALS: improve symptoms  NEXT MD VISIT: as needed  OBJECTIVE:   DIAGNOSTIC FINDINGS:  IMPRESSION: 1. Facet arthropathy most advanced at L4-L5, with a small effusion on the left and periarticular edema and enhancement on the right. This may reflect a source of pain. 2. Diffuse disc bulge at L4-L5 resulting in narrowing of the subarticular zones without evidence of frank nerve root impingement, and mild bilateral neural foraminal stenosis with possible irritation of the exiting right L4 nerve root by  extraforaminal disc material. 3. Diffuse disc bulge at L5-S1 which contacts the traversing right S1 nerve root. 4. No other significant spinal canal or neural foraminal stenosis, and no other evidence of nerve root impingement.  Small volume of fluid in the trochanteric bursa bilaterally suggestive of bursitis.   Mild left hip degenerative change  PATIENT SURVEYS:  FOTO 56, goal 68%  SCREENING FOR RED FLAGS: Bowel or bladder incontinence: No Spinal tumors: No Cauda equina syndrome: No Compression fracture: No Abdominal aneurysm: No  COGNITION: Overall cognitive status: Within functional limits for tasks assessed     SENSATION: Pt reports numbness Lt>Rt posterior thigh sometimes into calf, comes and goes, scrotum will go numb with sitting >15' depending on seat  MUSCLE LENGTH: Hamstrings: Right 50 deg; Left 50 deg End range tightness Lt quad and bil ITB Increased tone of bil hamstrings and lateral gastroc  POSTURE:  sway back, hips shunted forward in sockets, overuse of gluteals - able to correct with VC  PALPATION: Increased tone of bil hamstrings and lateral gastroc Tender along bil lumbar mutlifidi TP present in bil gluteals, piriformis, lateral hamstring and lateral gastroc  LUMBAR ROM:   AROM eval  Flexion Full with LBP  Extension Full with LBP  Right lateral flexion Full Rt sided pain  Left lateral flexion Full Rt sided pain  Right rotation 50%  Left rotation 50%   (Blank rows = not tested)  LOWER EXTREMITY ROM:    Hips and knees WNL    LOWER EXTREMITY MMT:   5/5 bil LE   LUMBAR SPECIAL TESTS:  Straight leg raise test: Negative  FUNCTIONAL TESTS:  Able to perform SLS, able to deep squat and return to stand  GAIT: Distance walked: within clinic Assistive device utilized: None Level of assistance: Complete Independence Comments: stands and walks with sway back, pelvis anterior to trunk  TODAY'S TREATMENT:  DATE:  6/18:  Review of imaging with spine model and correlating of findings with symptoms Review of what Pt is currently doing for exercise   PATIENT EDUCATION:  Education details: anatomy of spine to go over imaging results of lumbar spine and pelvis/hips Person educated: Patient Education method: Explanation Education comprehension: verbalized understanding  HOME EXERCISE PROGRAM: Start next time - verbally discussed his current HEP regimen: supine LTR, supine HS/adductor/IRB stretch with strap, piriformis stretch  ASSESSMENT:  CLINICAL IMPRESSION: Patient is a 62 y.o. male who was seen today for physical therapy evaluation and treatment for chronic LBP and bil hip pain.  He reports pain ranges from 2-8/10, worse with sitting and better with movement.  Pain is located in bil lumbar spine and bil lateral hips.  He will get numbness down posterior LE Lt>Rt.  He has limited trunk ROM with pain, very tight hamstrings, and limited segmental lumbar mobility with PA testing.  Hip rotation and flexion are WNL.  LE strength is 5/5.  TP are present in lumbar spine and bil hips.  Pt has poor posture with sway back but is able to correct with cueing.  He overuses gluteals for stabilization.  Pt will benefit from skilled PT to address findings and maximize function with less pain.  OBJECTIVE IMPAIRMENTS: decreased mobility, decreased ROM, decreased strength, hypomobility, increased muscle spasms, impaired flexibility, improper body mechanics, postural dysfunction, and pain.   ACTIVITY LIMITATIONS: sitting, standing, and sleeping  PARTICIPATION LIMITATIONS: community activity and occupation  PERSONAL FACTORS: Time since onset of injury/illness/exacerbation are also affecting patient's functional outcome.   REHAB POTENTIAL: Excellent  CLINICAL DECISION MAKING: Stable/uncomplicated  EVALUATION COMPLEXITY:  Low   GOALS: Goals reviewed with patient? Yes  SHORT TERM GOALS: Target date: 09/18/22  Pt will be ind with initial HEP Baseline: Goal status: INITIAL  2.  Pt will achieve hamstring length of at least 65 deg bil Baseline: 50 deg bil Goal status: INITIAL  3.  Pt will demo proper postural alignment in sitting and standing within sessions with intermittent cueing for correction needed. Baseline:  Goal status: INITIAL  4.  Pt will report reduction in pain with sitting for work by 25% Baseline:  Goal status: INITIAL    LONG TERM GOALS: Target date: 10/16/22  Pt will be ind with advanced HEP Baseline:  Goal status: INITIAL  2.  Pt will demo full trunk ROM without pain for improved functional task performance. Baseline:  Goal status: INITIAL  3.  Pt will be able to sit for up to 2 hours for work without exacerbation of pain and LE symptoms. Baseline:  Goal status: INITIAL  4.  Pt will demo proper stabilization strategies within dynamic therex without need for postural cueing Baseline:  Goal status: INITIAL  5.  Pt will achieve LE flexibility to WNL bil to reduce undue strain on trunk, pelvis and hips. Baseline:  Goal status: INITIAL  6.  Pt will be able to sleep without pain interruption  Baseline:  Goal status: INITIAL  PLAN:  PT FREQUENCY: 2x/week  PT DURATION: 8 weeks  PLANNED INTERVENTIONS: Therapeutic exercises, Therapeutic activity, Neuromuscular re-education, Patient/Family education, Self Care, Joint mobilization, Dry Needling, Electrical stimulation, Spinal mobilization, Cryotherapy, Moist heat, Taping, Ionotophoresis 4mg /ml Dexamethasone, and Manual therapy.  PLAN FOR NEXT SESSION: DN lumbar and bil hips, start HEP for LE stretching, postural re-ed to correct sway back static and dynamic control, review sitting posture for work   [employer], PT 08/21/22 12:54 PM

## 2022-08-30 ENCOUNTER — Encounter: Payer: Self-pay | Admitting: Physical Therapy

## 2022-08-30 ENCOUNTER — Ambulatory Visit: Payer: 59 | Admitting: Physical Therapy

## 2022-08-30 DIAGNOSIS — M545 Low back pain, unspecified: Secondary | ICD-10-CM

## 2022-08-30 DIAGNOSIS — M25551 Pain in right hip: Secondary | ICD-10-CM

## 2022-08-30 NOTE — Therapy (Signed)
OUTPATIENT PHYSICAL THERAPY THORACOLUMBAR EVALUATION   Patient Name: Sean Cochran MRN: [id number] DOB:06/20/1960, 62 y.o., male Today's Date: 08/30/2022  END OF SESSION:  PT End of Session - 08/30/22 1236     Visit Number 2    Date for PT Re-Evaluation 10/16/22    Authorization Type [employer]    PT Start Time 1232    PT Stop Time 1315    PT Time Calculation (min) 43 min    Activity Tolerance Patient tolerated treatment well    Behavior During Therapy [facility] for tasks assessed/performed              Past Medical History:  Diagnosis Date   Abnormal finding on MRI of brain 10/04/2014   Back pain    Colon polyp 02/19/2013   Dr. Loreta Ave - Repeat in 5 years   Constipation    Hip pain    History of pineal cyst    Hyperlipidemia    Memory loss    Past Surgical History:  Procedure Laterality Date   BICEPS TENDON REPAIR Left    COLONOSCOPY     ROTATOR CUFF REPAIR Right    TENNIS ELBOW RELEASE/NIRSCHEL PROCEDURE Left 08/31/2021   Procedure: TENNIS ELBOW RELEASE/NIRSCHEL PROCEDURE;  Surgeon: Teryl Lucy, MD;  Location: [facility];  Service: Orthopedics;  Laterality: Left;   TONSILLECTOMY     VASECTOMY     Patient Active Problem List   Diagnosis Date Noted   Allergic rhinitis 06/20/2020   Nonalcoholic steatohepatitis (NASH) 06/17/2018   Eczema 12/31/2016   Routine general medical examination at a health care facility 06/26/2016   Abnormal finding on MRI of brain 10/04/2014   Hyperlipidemia LDL goal <160 05/08/2013    PCP: Etta Grandchild MD  REFERRING PROVIDER: Ralene Cork, DO  REFERRING DIAG: [phone] (ICD-10-CM) - Bilateral hip pain M54.50 (ICD-10-CM) - Bilateral low back pain without sciatica, unspecified chronicity  Rationale for Evaluation and Treatment: Rehabilitation  THERAPY DIAG:  Bilateral hip pain  Bilateral low back pain without sciatica, unspecified chronicity  ONSET DATE: chronic  SUBJECTIVE:                                                                                                                                                                                            SUBJECTIVE STATEMENT: I am about the same - still some numbness into hip Lt>Rt   Eval: Pt has chronic LBP and bil hip pain.  "I've always had LBP."  Pain is in Lt/Rt low back "in the dents" and in the hips behind the hip bones.  About 2 years ago I started having numbness  in scrotum and did some ortho PT with some improvement but not really getting better.  If I sit on a stationary bike my groin will go numb in 15 min. My legs can go numb, Lt>Rt, typically in hamstring but can extend into back of calf. I usually do yoga on mat on floor - if I lay flat on back my legs can start going numb.  PERTINENT HISTORY:  Plays tennis   PAIN:  PAIN:  Are you having pain? Yes NPRS scale: 2-8/10 Pain location: Rt/Lt lumbar and bil hips laterally behind greater trochanters Pain orientation: Bilateral  PAIN TYPE: aching, sharp, and tight Pain description: constant  Aggravating factors: sitting, driving Relieving factors: movement, maybe yoga   PRECAUTIONS: None  WEIGHT BEARING RESTRICTIONS: No  FALLS:  Has patient fallen in last 6 months? No  LIVING ENVIRONMENT: Lives with: lives with their spouse Lives in: House/apartment   OCCUPATION: works for [employer] - sitting or driving  PLOF: Independent  PATIENT GOALS: improve symptoms  NEXT MD VISIT: as needed  OBJECTIVE:   DIAGNOSTIC FINDINGS:  IMPRESSION: 1. Facet arthropathy most advanced at L4-L5, with a small effusion on the left and periarticular edema and enhancement on the right. This may reflect a source of pain. 2. Diffuse disc bulge at L4-L5 resulting in narrowing of the subarticular zones without evidence of frank nerve root impingement, and mild bilateral neural foraminal stenosis with possible irritation of the exiting right L4 nerve root by extraforaminal  disc material. 3. Diffuse disc bulge at L5-S1 which contacts the traversing right S1 nerve root. 4. No other significant spinal canal or neural foraminal stenosis, and no other evidence of nerve root impingement.  Small volume of fluid in the trochanteric bursa bilaterally suggestive of bursitis.   Mild left hip degenerative change  PATIENT SURVEYS:  FOTO 56, goal 68%  SCREENING FOR RED FLAGS: Bowel or bladder incontinence: No Spinal tumors: No Cauda equina syndrome: No Compression fracture: No Abdominal aneurysm: No  COGNITION: Overall cognitive status: Within functional limits for tasks assessed     SENSATION: Pt reports numbness Lt>Rt posterior thigh sometimes into calf, comes and goes, scrotum will go numb with sitting >15' depending on seat  MUSCLE LENGTH: Hamstrings: Right 50 deg; Left 50 deg End range tightness Lt quad and bil ITB Increased tone of bil hamstrings and lateral gastroc  POSTURE:  sway back, hips shunted forward in sockets, overuse of gluteals - able to correct with VC  PALPATION: Increased tone of bil hamstrings and lateral gastroc Tender along bil lumbar mutlifidi TP present in bil gluteals, piriformis, lateral hamstring and lateral gastroc  LUMBAR ROM:   AROM eval  Flexion Full with LBP  Extension Full with LBP  Right lateral flexion Full Rt sided pain  Left lateral flexion Full Rt sided pain  Right rotation 50%  Left rotation 50%   (Blank rows = not tested)  LOWER EXTREMITY ROM:    Hips and knees WNL    LOWER EXTREMITY MMT:   5/5 bil LE   LUMBAR SPECIAL TESTS:  Straight leg raise test: Negative  FUNCTIONAL TESTS:  Able to perform SLS, able to deep squat and return to stand  GAIT: Distance walked: within clinic Assistive device utilized: None Level of assistance: Complete Independence Comments: stands and walks with sway back, pelvis anterior to trunk  TODAY'S TREATMENT:  DATE:  6/27: Recumbent bike L2 x 3' Seated hamstring stretch 1x30" bil Standing quad stretch 1x30" bil Gastroc stretch off stair x30" each LE (trial of prostretch too - he may get one) Cat/cow x 10 Supine piriformis stretch 1x30" bil Supine ITB with strap 1x30" bil HEP for all above stretches initiated Trigger Point Dry-Needling  Treatment instructions: Expect mild to moderate muscle soreness. S/S of pneumothorax if dry needled over a lung field, and to seek immediate medical attention should they occur. Patient verbalized understanding of these instructions and education.  Patient Consent Given: Yes Education handout provided: Previously provided Muscles treated: bil lumbar multifidi, Lt gluteals and piriformis Electrical stimulation performed: No Parameters: N/A Treatment response/outcome: signif twitches in upper lumbar multifidi, Lt piriformis   6/18:  Review of imaging with spine model and correlating of findings with symptoms Review of what Pt is currently doing for exercise   PATIENT EDUCATION:  Education details: anatomy of spine to go over imaging results of lumbar spine and pelvis/hips Person educated: Patient Education method: Explanation Education comprehension: verbalized understanding  HOME EXERCISE PROGRAM: Access Code: [email or web address] URL: [email or web address] Date: 08/30/2022 Prepared by: Loistine Simas Emili Mcloughlin  Exercises - Seated Hamstring Stretch  - 1 x daily - 7 x weekly - 1 sets - 2-3 reps - 20-30 hold - Standing Quadriceps Stretch  - 1 x daily - 7 x weekly - 1 sets - 2-3 reps - 20-30 hold - Standing Bilateral Gastroc Stretch with Step  - 1 x daily - 7 x weekly - 1 sets - 2-3 reps - 20-30 hold - Cat Cow  - 1 x daily - 7 x weekly - 1 sets - 10 reps - Supine Piriformis Stretch with Foot on Ground  - 1 x daily - 7 x weekly - 1 sets - 2-3 reps - 20-30 hold - Supine ITB Stretch with  Strap  - 1 x daily - 7 x weekly - 1 sets - 2-3 reps - 20-30 hold  ASSESSMENT:  CLINICAL IMPRESSION: Pt with good response to DN#1 today targeting bil lumbar multifidi and Lt hip.  Signif twitch and release in bil upper lumbar segments and Lt piriformis especially.  Initiated HEP for LE stretches and spine ROM.  Pt needed tactile cues for initial reps of cat/cow.  Aftercare for DN reiterated - he has had it before for his shoulder.    OBJECTIVE IMPAIRMENTS: decreased mobility, decreased ROM, decreased strength, hypomobility, increased muscle spasms, impaired flexibility, improper body mechanics, postural dysfunction, and pain.   ACTIVITY LIMITATIONS: sitting, standing, and sleeping  PARTICIPATION LIMITATIONS: community activity and occupation  PERSONAL FACTORS: Time since onset of injury/illness/exacerbation are also affecting patient's functional outcome.   REHAB POTENTIAL: Excellent  CLINICAL DECISION MAKING: Stable/uncomplicated  EVALUATION COMPLEXITY: Low   GOALS: Goals reviewed with patient? Yes  SHORT TERM GOALS: Target date: 09/18/22  Pt will be ind with initial HEP Baseline: Goal status: ongoing  2.  Pt will achieve hamstring length of at least 65 deg bil Baseline: 50 deg bil Goal status: INITIAL  3.  Pt will demo proper postural alignment in sitting and standing within sessions with intermittent cueing for correction needed. Baseline:  Goal status: INITIAL  4.  Pt will report reduction in pain with sitting for work by 25% Baseline:  Goal status: INITIAL    LONG TERM GOALS: Target date: 10/16/22  Pt will be ind with advanced HEP Baseline:  Goal status: INITIAL  2.  Pt will demo full trunk ROM without pain for improved  functional task performance. Baseline:  Goal status: INITIAL  3.  Pt will be able to sit for up to 2 hours for work without exacerbation of pain and LE symptoms. Baseline:  Goal status: INITIAL  4.  Pt will demo proper stabilization  strategies within dynamic therex without need for postural cueing Baseline:  Goal status: INITIAL  5.  Pt will achieve LE flexibility to WNL bil to reduce undue strain on trunk, pelvis and hips. Baseline:  Goal status: INITIAL  6.  Pt will be able to sleep without pain interruption  Baseline:  Goal status: INITIAL  PLAN:  PT FREQUENCY: 2x/week  PT DURATION: 8 weeks  PLANNED INTERVENTIONS: Therapeutic exercises, Therapeutic activity, Neuromuscular re-education, Patient/Family education, Self Care, Joint mobilization, Dry Needling, Electrical stimulation, Spinal mobilization, Cryotherapy, Moist heat, Taping, Ionotophoresis 4mg /ml Dexamethasone, and Manual therapy.  PLAN FOR NEXT SESSION: f/u on DN #1 to lumbar and Lt hip, DN Rt hip and bil hamstrings next time, LE stretching, postural re-ed to correct sway back static and dynamic control, review sitting posture for work   [employer], PT 08/30/22 1:30 PM

## 2022-09-03 ENCOUNTER — Ambulatory Visit: Payer: 59 | Attending: Sports Medicine | Admitting: Physical Therapy

## 2022-09-03 ENCOUNTER — Encounter: Payer: Self-pay | Admitting: Physical Therapy

## 2022-09-03 DIAGNOSIS — M6281 Muscle weakness (generalized): Secondary | ICD-10-CM | POA: Insufficient documentation

## 2022-09-03 DIAGNOSIS — M25552 Pain in left hip: Secondary | ICD-10-CM | POA: Diagnosis present

## 2022-09-03 DIAGNOSIS — M25551 Pain in right hip: Secondary | ICD-10-CM | POA: Insufficient documentation

## 2022-09-03 DIAGNOSIS — M545 Low back pain, unspecified: Secondary | ICD-10-CM | POA: Insufficient documentation

## 2022-09-03 DIAGNOSIS — M25522 Pain in left elbow: Secondary | ICD-10-CM | POA: Insufficient documentation

## 2022-09-03 NOTE — Therapy (Signed)
OUTPATIENT PHYSICAL THERAPY THORACOLUMBAR EVALUATION   Patient Name: Sean Cochran MRN: [id number] DOB:[birth year], 62 y.o., male Today's Date: 09/03/2022  END OF SESSION:  PT End of Session - 09/03/22 1404     Visit Number 3    Date for PT Re-Evaluation 10/16/22    Authorization Type [employer]    PT Start Time 1402    PT Stop Time 1447    PT Time Calculation (min) 45 min    Activity Tolerance Patient tolerated treatment well    Behavior During Therapy [facility] for tasks assessed/performed               Past Medical History:  Diagnosis Date   Abnormal finding on MRI of brain 10/04/2014   Back pain    Colon polyp 02/19/2013   Dr. Loreta Ave - Repeat in 5 years   Constipation    Hip pain    History of pineal cyst    Hyperlipidemia    Memory loss    Past Surgical History:  Procedure Laterality Date   BICEPS TENDON REPAIR Left    COLONOSCOPY     ROTATOR CUFF REPAIR Right    TENNIS ELBOW RELEASE/NIRSCHEL PROCEDURE Left 08/31/2021   Procedure: TENNIS ELBOW RELEASE/NIRSCHEL PROCEDURE;  Surgeon: Teryl Lucy, MD;  Location: [facility];  Service: Orthopedics;  Laterality: Left;   TONSILLECTOMY     VASECTOMY     Patient Active Problem List   Diagnosis Date Noted   Allergic rhinitis 06/20/2020   Nonalcoholic steatohepatitis (NASH) 06/17/2018   Eczema 12/31/2016   Routine general medical examination at a health care facility 06/26/2016   Abnormal finding on MRI of brain 10/04/2014   Hyperlipidemia LDL goal <160 05/08/2013    PCP: Etta Grandchild MD  REFERRING PROVIDER: Ralene Cork, DO  REFERRING DIAG: [phone] (ICD-10-CM) - Bilateral hip pain M54.50 (ICD-10-CM) - Bilateral low back pain without sciatica, unspecified chronicity  Rationale for Evaluation and Treatment: Rehabilitation  THERAPY DIAG:  Bilateral hip pain  Bilateral low back pain without sciatica, unspecified chronicity  ONSET DATE: chronic  SUBJECTIVE:                                                                                                                                                                                            SUBJECTIVE STATEMENT: No big changes with the first round of DN.  I have very tender SITS bones and still getting intermittent numbness into Lt hip and hamstring areas.  I have some tenderness that prevents my from sitting on edge of chair to stretch my HS at home.    Eval: Pt has chronic  LBP and bil hip pain.  "I've always had LBP."  Pain is in Lt/Rt low back "in the dents" and in the hips behind the hip bones.  About 2 years ago I started having numbness in scrotum and did some ortho PT with some improvement but not really getting better.  If I sit on a stationary bike my groin will go numb in 15 min. My legs can go numb, Lt>Rt, typically in hamstring but can extend into back of calf. I usually do yoga on mat on floor - if I lay flat on back my legs can start going numb.  PERTINENT HISTORY:  Plays tennis   PAIN:  PAIN:  Are you having pain? Yes NPRS scale: 2-3/10 Pain location: Rt/Lt lumbar and bil hips laterally behind greater trochanters Pain orientation: Bilateral  PAIN TYPE: aching, sharp, and tight Pain description: constant  Aggravating factors: sitting, driving Relieving factors: movement, maybe yoga   PRECAUTIONS: None  WEIGHT BEARING RESTRICTIONS: No  FALLS:  Has patient fallen in last 6 months? No  LIVING ENVIRONMENT: Lives with: lives with their spouse Lives in: House/apartment   OCCUPATION: works for [employer] - sitting or driving  PLOF: Independent  PATIENT GOALS: improve symptoms  NEXT MD VISIT: as needed  OBJECTIVE:   DIAGNOSTIC FINDINGS:  IMPRESSION: 1. Facet arthropathy most advanced at L4-L5, with a small effusion on the left and periarticular edema and enhancement on the right. This may reflect a source of pain. 2. Diffuse disc bulge at L4-L5 resulting in narrowing of  the subarticular zones without evidence of frank nerve root impingement, and mild bilateral neural foraminal stenosis with possible irritation of the exiting right L4 nerve root by extraforaminal disc material. 3. Diffuse disc bulge at L5-S1 which contacts the traversing right S1 nerve root. 4. No other significant spinal canal or neural foraminal stenosis, and no other evidence of nerve root impingement.  Small volume of fluid in the trochanteric bursa bilaterally suggestive of bursitis.   Mild left hip degenerative change  PATIENT SURVEYS:  FOTO 56, goal 68%  SCREENING FOR RED FLAGS: Bowel or bladder incontinence: No Spinal tumors: No Cauda equina syndrome: No Compression fracture: No Abdominal aneurysm: No  COGNITION: Overall cognitive status: Within functional limits for tasks assessed     SENSATION: Pt reports numbness Lt>Rt posterior thigh sometimes into calf, comes and goes, scrotum will go numb with sitting >15' depending on seat  MUSCLE LENGTH: Hamstrings: Right 50 deg; Left 50 deg End range tightness Lt quad and bil ITB Increased tone of bil hamstrings and lateral gastroc  POSTURE:  sway back, hips shunted forward in sockets, overuse of gluteals - able to correct with VC  PALPATION: Increased tone of bil hamstrings and lateral gastroc Tender along bil lumbar mutlifidi TP present in bil gluteals, piriformis, lateral hamstring and lateral gastroc  LUMBAR ROM:   AROM eval  Flexion Full with LBP  Extension Full with LBP  Right lateral flexion Full Rt sided pain  Left lateral flexion Full Rt sided pain  Right rotation 50%  Left rotation 50%   (Blank rows = not tested)  LOWER EXTREMITY ROM:    Hips and knees WNL    LOWER EXTREMITY MMT:   5/5 bil LE   LUMBAR SPECIAL TESTS:  Straight leg raise test: Negative  FUNCTIONAL TESTS:  Able to perform SLS, able to deep squat and return to stand  GAIT: Distance walked: within clinic Assistive device  utilized: None Level of assistance: Complete Independence Comments:  stands and walks with sway back, pelvis anterior to trunk  TODAY'S TREATMENT:                                                                                                                              DATE:  7/1: Recumbent bike L2 x 4' Bil gastroc stretch with pro-stretch 2x20" bil Standing HS stretch foot on 3rd step 2x20" bil (HEP update vs sitting) Deadlift 15lb kbell x 8, last 3 reps down to 10lb (Pt with difficulty with form) Standing T at counter 5lb kbell x 10 each LE (HEP) Bird dog to knee tap x 5 rounds hold 5 sec (HEP) Prone forearm plank x 30" (HEP) Trigger Point Dry-Needling  Treatment instructions: Expect mild to moderate muscle soreness. S/S of pneumothorax if dry needled over a lung field, and to seek immediate medical attention should they occur. Patient verbalized understanding of these instructions and education.  Patient Consent Given: Yes Education handout provided: Previously provided Muscles treated: bil hamstrings Electrical stimulation performed: No Parameters: N/A Treatment response/outcome: mild twitches Rt>Lt and at more lateral and proximal points   6/27: Recumbent bike L2 x 3' Seated hamstring stretch 1x30" bil Standing quad stretch 1x30" bil Gastroc stretch off stair x30" each LE (trial of prostretch too - he may get one) Cat/cow x 10 Supine piriformis stretch 1x30" bil Supine ITB with strap 1x30" bil HEP for all above stretches initiated Trigger Point Dry-Needling  Treatment instructions: Expect mild to moderate muscle soreness. S/S of pneumothorax if dry needled over a lung field, and to seek immediate medical attention should they occur. Patient verbalized understanding of these instructions and education.  Patient Consent Given: Yes Education handout provided: Previously provided Muscles treated: bil lumbar multifidi, Lt gluteals and piriformis Electrical stimulation  performed: No Parameters: N/A Treatment response/outcome: signif twitches in upper lumbar multifidi, Lt piriformis   6/18:  Review of imaging with spine model and correlating of findings with symptoms Review of what Pt is currently doing for exercise   PATIENT EDUCATION:  Education details: anatomy of spine to go over imaging results of lumbar spine and pelvis/hips Person educated: Patient Education method: Explanation Education comprehension: verbalized understanding  HOME EXERCISE PROGRAM: Access Code: [email or web address] URL: [email or web address] Date: 09/03/2022 Prepared by: Loistine Simas Rayden Dock  Exercises - Standing Quadriceps Stretch  - 1 x daily - 7 x weekly - 1 sets - 2-3 reps - 20-30 hold - Standing Hamstring Stretch on Chair  - 1 x daily - 7 x weekly - 1 sets - 2-3 reps - 20-30 hold - Standing Bilateral Gastroc Stretch with Step  - 1 x daily - 7 x weekly - 1 sets - 2-3 reps - 20-30 hold - Cat Cow  - 1 x daily - 7 x weekly - 1 sets - 10 reps - Supine Piriformis Stretch with Foot on Ground  - 1 x daily - 7 x weekly - 1 sets - 2-3 reps - 20-30 hold - Supine  ITB Stretch with Strap  - 1 x daily - 7 x weekly - 1 sets - 2-3 reps - 20-30 hold - Single-Leg [place] Deadlift With Kettlebell  - 1 x daily - 7 x weekly - 1 sets - 10 reps - Bird Dog with Knee Taps  - 1 x daily - 7 x weekly - 1 sets - 5 reps - 5 hold - Standard Plank  - 1 x daily - 7 x weekly - 1 sets - 3 reps - 30 hold  ASSESSMENT:  CLINICAL IMPRESSION: Pt with ongoing Lt buttock and posterior thigh numbness and very tender bil SITS bones.  Pt reported no major change in symptoms after DN#1 last session.  Updated HEP to switch seated HS stretch to standing with foot on chair due to pain in sitting directly on SITS bones.  Pt demos improving postural awareness throughout session.  He had difficulty with dead lift form today and tended to perform more of a squat despite cueing.  Had better success with standing T at  counter so added this to HEP, in addition to bird dog with knee tap and prone plank on forearms.  DN performed today to bil HS with some twitches in lateral and proximal hamstrings Rt>Lt today.    OBJECTIVE IMPAIRMENTS: decreased mobility, decreased ROM, decreased strength, hypomobility, increased muscle spasms, impaired flexibility, improper body mechanics, postural dysfunction, and pain.   ACTIVITY LIMITATIONS: sitting, standing, and sleeping  PARTICIPATION LIMITATIONS: community activity and occupation  PERSONAL FACTORS: Time since onset of injury/illness/exacerbation are also affecting patient's functional outcome.   REHAB POTENTIAL: Excellent  CLINICAL DECISION MAKING: Stable/uncomplicated  EVALUATION COMPLEXITY: Low   GOALS: Goals reviewed with patient? Yes  SHORT TERM GOALS: Target date: 09/18/22  Pt will be ind with initial HEP Baseline: Goal status: ongoing  2.  Pt will achieve hamstring length of at least 65 deg bil Baseline: 50 deg bil Goal status: INITIAL  3.  Pt will demo proper postural alignment in sitting and standing within sessions with intermittent cueing for correction needed. Baseline:  Goal status: INITIAL  4.  Pt will report reduction in pain with sitting for work by 25% Baseline:  Goal status: INITIAL    LONG TERM GOALS: Target date: 10/16/22  Pt will be ind with advanced HEP Baseline:  Goal status: INITIAL  2.  Pt will demo full trunk ROM without pain for improved functional task performance. Baseline:  Goal status: INITIAL  3.  Pt will be able to sit for up to 2 hours for work without exacerbation of pain and LE symptoms. Baseline:  Goal status: INITIAL  4.  Pt will demo proper stabilization strategies within dynamic therex without need for postural cueing Baseline:  Goal status: INITIAL  5.  Pt will achieve LE flexibility to WNL bil to reduce undue strain on trunk, pelvis and hips. Baseline:  Goal status: INITIAL  6.  Pt will be  able to sleep without pain interruption  Baseline:  Goal status: INITIAL  PLAN:  PT FREQUENCY: 2x/week  PT DURATION: 8 weeks  PLANNED INTERVENTIONS: Therapeutic exercises, Therapeutic activity, Neuromuscular re-education, Patient/Family education, Self Care, Joint mobilization, Dry Needling, Electrical stimulation, Spinal mobilization, Cryotherapy, Moist heat, Taping, Ionotophoresis 4mg /ml Dexamethasone, and Manual therapy.  PLAN FOR NEXT SESSION: f/u on DN #2 to bil HS and review updates to HEP, work on HS flexiblity, repeat DN if helpful, progress core and functional LE strength   Brevyn Ring, PT 09/03/22 3:35 PM

## 2022-09-13 ENCOUNTER — Ambulatory Visit: Payer: 59

## 2022-09-13 DIAGNOSIS — M25551 Pain in right hip: Secondary | ICD-10-CM | POA: Diagnosis not present

## 2022-09-13 DIAGNOSIS — M545 Low back pain, unspecified: Secondary | ICD-10-CM

## 2022-09-13 NOTE — Therapy (Signed)
OUTPATIENT PHYSICAL THERAPY THORACOLUMBAR EVALUATION   Patient Name: Sean Cochran MRN: [id number] DOB:[birth year], 62 y.o., male Today's Date: 09/13/2022  END OF SESSION:  PT End of Session - 09/13/22 1028     Visit Number 4    Date for PT Re-Evaluation 10/16/22    Authorization Type [employer]    PT Start Time 1017    PT Stop Time 1101    PT Time Calculation (min) 44 min    Activity Tolerance Patient tolerated treatment well    Behavior During Therapy [facility] for tasks assessed/performed                Past Medical History:  Diagnosis Date   Abnormal finding on MRI of brain 10/04/2014   Back pain    Colon polyp 02/19/2013   Dr. Loreta Ave - Repeat in 5 years   Constipation    Hip pain    History of pineal cyst    Hyperlipidemia    Memory loss    Past Surgical History:  Procedure Laterality Date   BICEPS TENDON REPAIR Left    COLONOSCOPY     ROTATOR CUFF REPAIR Right    TENNIS ELBOW RELEASE/NIRSCHEL PROCEDURE Left 08/31/2021   Procedure: TENNIS ELBOW RELEASE/NIRSCHEL PROCEDURE;  Surgeon: Teryl Lucy, MD;  Location: [facility];  Service: Orthopedics;  Laterality: Left;   TONSILLECTOMY     VASECTOMY     Patient Active Problem List   Diagnosis Date Noted   Allergic rhinitis 06/20/2020   Nonalcoholic steatohepatitis (NASH) 06/17/2018   Eczema 12/31/2016   Routine general medical examination at a health care facility 06/26/2016   Abnormal finding on MRI of brain 10/04/2014   Hyperlipidemia LDL goal <160 05/08/2013    PCP: Etta Grandchild MD  REFERRING PROVIDER: Ralene Cork, DO  REFERRING DIAG: [phone] (ICD-10-CM) - Bilateral hip pain M54.50 (ICD-10-CM) - Bilateral low back pain without sciatica, unspecified chronicity  Rationale for Evaluation and Treatment: Rehabilitation  THERAPY DIAG:  Bilateral hip pain  Bilateral low back pain without sciatica, unspecified chronicity  ONSET DATE: chronic  SUBJECTIVE:                                                                                                                                                                                            SUBJECTIVE STATEMENT: I drove to [place] and back so my hamstrings are tight. I'm doing my exercises.  I bought a [employer] data processor and use that at home.    Eval: Pt has chronic LBP and bil hip pain.  "I've always had LBP."  Pain is in Lt/Rt low back "in the dents"  and in the hips behind the hip bones.  About 2 years ago I started having numbness in scrotum and did some ortho PT with some improvement but not really getting better.  If I sit on a stationary bike my groin will go numb in 15 min. My legs can go numb, Lt>Rt, typically in hamstring but can extend into back of calf. I usually do yoga on mat on floor - if I lay flat on back my legs can start going numb.  PERTINENT HISTORY:  Plays tennis   PAIN:  PAIN:  Are you having pain? Yes NPRS scale: 2-3/10 Pain location: Rt/Lt lumbar and bil hips laterally behind greater trochanters Pain orientation: Bilateral  PAIN TYPE: aching, sharp, and tight Pain description: constant  Aggravating factors: sitting, driving Relieving factors: movement, maybe yoga   PRECAUTIONS: None  WEIGHT BEARING RESTRICTIONS: No  FALLS:  Has patient fallen in last 6 months? No  LIVING ENVIRONMENT: Lives with: lives with their spouse Lives in: House/apartment   OCCUPATION: works for [employer] - sitting or driving  PLOF: Independent  PATIENT GOALS: improve symptoms  NEXT MD VISIT: as needed  OBJECTIVE:   DIAGNOSTIC FINDINGS:  IMPRESSION: 1. Facet arthropathy most advanced at L4-L5, with a small effusion on the left and periarticular edema and enhancement on the right. This may reflect a source of pain. 2. Diffuse disc bulge at L4-L5 resulting in narrowing of the subarticular zones without evidence of frank nerve root impingement, and mild bilateral neural foraminal stenosis with  possible irritation of the exiting right L4 nerve root by extraforaminal disc material. 3. Diffuse disc bulge at L5-S1 which contacts the traversing right S1 nerve root. 4. No other significant spinal canal or neural foraminal stenosis, and no other evidence of nerve root impingement.  Small volume of fluid in the trochanteric bursa bilaterally suggestive of bursitis.   Mild left hip degenerative change  PATIENT SURVEYS:  FOTO 56, goal 68%  SCREENING FOR RED FLAGS: Bowel or bladder incontinence: No Spinal tumors: No Cauda equina syndrome: No Compression fracture: No Abdominal aneurysm: No  COGNITION: Overall cognitive status: Within functional limits for tasks assessed     SENSATION: Pt reports numbness Lt>Rt posterior thigh sometimes into calf, comes and goes, scrotum will go numb with sitting >15' depending on seat  MUSCLE LENGTH: Hamstrings: Right 50 deg; Left 50 deg End range tightness Lt quad and bil ITB Increased tone of bil hamstrings and lateral gastroc  POSTURE:  sway back, hips shunted forward in sockets, overuse of gluteals - able to correct with VC  PALPATION: Increased tone of bil hamstrings and lateral gastroc Tender along bil lumbar mutlifidi TP present in bil gluteals, piriformis, lateral hamstring and lateral gastroc  LUMBAR ROM:   AROM eval  Flexion Full with LBP  Extension Full with LBP  Right lateral flexion Full Rt sided pain  Left lateral flexion Full Rt sided pain  Right rotation 50%  Left rotation 50%   (Blank rows = not tested)  LOWER EXTREMITY ROM:    Hips and knees WNL    LOWER EXTREMITY MMT:   5/5 bil LE   LUMBAR SPECIAL TESTS:  Straight leg raise test: Negative  FUNCTIONAL TESTS:  Able to perform SLS, able to deep squat and return to stand  GAIT: Distance walked: within clinic Assistive device utilized: None Level of assistance: Complete Independence Comments: stands and walks with sway back, pelvis anterior to  trunk  TODAY'S TREATMENT:      DATE:  09/13/22: Recumbent bike L2 x 6'- discussed seat cushion for work Bil gastroc stretch with pro-stretch 2x20" bil Standing HS stretch foot on mat table 2x20" bil-doing better with standing version Standing T at counter 5lb kbell x 10 each LE  Bird dog to knee tap x 5 rounds hold 5 sec  Pallof press: red 2x10 bil each Prone forearm plank x 30"  Trigger Point Dry-Needling  Treatment instructions: Expect mild to moderate muscle soreness. S/S of pneumothorax if dry needled over a lung field, and to seek immediate medical attention should they occur. Patient verbalized understanding of these instructions and education.  Patient Consent Given: Yes Education handout provided: Previously provided Muscles treated: bil hamstrings Electrical stimulation performed: No Parameters: N/A Treatment response/outcome: mild twitches Rt>Lt and at more lateral and proximal points                                                                                                                          DATE:  7/1: Recumbent bike L2 x 4' Bil gastroc stretch with pro-stretch 2x20" bil Standing HS stretch foot on 3rd step 2x20" bil (HEP update vs sitting) Deadlift 10#, then 8#, difficulty with form Standing T at counter 5lb kbell x 10 each LE (HEP) Bird dog to knee tap x 5 rounds hold 5 sec (HEP) Prone forearm plank x 30" (HEP) Trigger Point Dry-Needling  Treatment instructions: Expect mild to moderate muscle soreness. S/S of pneumothorax if dry needled over a lung field, and to seek immediate medical attention should they occur. Patient verbalized understanding of these instructions and education.  Patient Consent Given: Yes Education handout provided: Previously provided Muscles treated: bil hamstrings Electrical stimulation performed: No Parameters: N/A Treatment response/outcome: mild twitches Rt>Lt and at more lateral and proximal points   6/27: Recumbent bike  L2 x 3' Seated hamstring stretch 1x30" bil Standing quad stretch 1x30" bil Gastroc stretch off stair x30" each LE (trial of prostretch too - he may get one) Cat/cow x 10 Supine piriformis stretch 1x30" bil Supine ITB with strap 1x30" bil HEP for all above stretches initiated Trigger Point Dry-Needling  Treatment instructions: Expect mild to moderate muscle soreness. S/S of pneumothorax if dry needled over a lung field, and to seek immediate medical attention should they occur. Patient verbalized understanding of these instructions and education.  Patient Consent Given: Yes Education handout provided: Previously provided Muscles treated: bil lumbar multifidi, Lt gluteals and piriformis Electrical stimulation performed: No Parameters: N/A Treatment response/outcome: signif twitches in upper lumbar multifidi, Lt piriformis  PATIENT EDUCATION:  Education details: anatomy of spine to go over imaging results of lumbar spine and pelvis/hips Person educated: Patient Education method: Explanation Education comprehension: verbalized understanding  HOME EXERCISE PROGRAM: Access Code: [email or web address] URL: [email or web address] Date: 09/03/2022 Prepared by: Loistine Simas Beuhring  Exercises - Standing Quadriceps Stretch  - 1 x daily - 7 x weekly - 1 sets - 2-3 reps - 20-30 hold - Standing Hamstring Stretch on Chair  - 1 x  daily - 7 x weekly - 1 sets - 2-3 reps - 20-30 hold - Standing Bilateral Gastroc Stretch with Step  - 1 x daily - 7 x weekly - 1 sets - 2-3 reps - 20-30 hold - Cat Cow  - 1 x daily - 7 x weekly - 1 sets - 10 reps - Supine Piriformis Stretch with Foot on Ground  - 1 x daily - 7 x weekly - 1 sets - 2-3 reps - 20-30 hold - Supine ITB Stretch with Strap  - 1 x daily - 7 x weekly - 1 sets - 2-3 reps - 20-30 hold - Single-Leg [place] Deadlift With Kettlebell  - 1 x daily - 7 x weekly - 1 sets - 10 reps - Bird Dog with Knee Taps  - 1 x daily - 7 x weekly - 1 sets - 5 reps - 5  hold - Standard Plank  - 1 x daily - 7 x weekly - 1 sets - 3 reps - 30 hold  ASSESSMENT:  CLINICAL IMPRESSION: Pt reports reduced symptoms since the start of care, however, pain has increased due to driving to [place] over the past 2 days.  Pt is performing HEP regularly at home.  Verbal cues required throughout session to improve alignment and neutral posture.  Pt with initial verbal required for forward T dead lift. Pt with good response to DN last session with reported improved muscle length.  Good response today with improved tissue mobility and twitch response in bil hamstrings.  Patient will benefit from skilled PT to address the below impairments and improve overall function.    OBJECTIVE IMPAIRMENTS: decreased mobility, decreased ROM, decreased strength, hypomobility, increased muscle spasms, impaired flexibility, improper body mechanics, postural dysfunction, and pain.   ACTIVITY LIMITATIONS: sitting, standing, and sleeping  PARTICIPATION LIMITATIONS: community activity and occupation  PERSONAL FACTORS: Time since onset of injury/illness/exacerbation are also affecting patient's functional outcome.   REHAB POTENTIAL: Excellent  CLINICAL DECISION MAKING: Stable/uncomplicated  EVALUATION COMPLEXITY: Low   GOALS: Goals reviewed with patient? Yes  SHORT TERM GOALS: Target date: 09/18/22  Pt will be ind with initial HEP Baseline: Goal status: ongoing  2.  Pt will achieve hamstring length of at least 65 deg bil Baseline: 50 deg bil Goal status: INITIAL  3.  Pt will demo proper postural alignment in sitting and standing within sessions with intermittent cueing for correction needed. Baseline: working on corrections (09/13/22) Goal status: IN PROGRESS  4.  Pt will report reduction in pain with sitting for work by 25% Baseline:  Goal status: INITIAL    LONG TERM GOALS: Target date: 10/16/22  Pt will be ind with advanced HEP Baseline:  Goal status: INITIAL  2.   Pt will demo full trunk ROM without pain for improved functional task performance. Baseline:  Goal status: INITIAL  3.  Pt will be able to sit for up to 2 hours for work without exacerbation of pain and LE symptoms. Baseline:  Goal status: INITIAL  4.  Pt will demo proper stabilization strategies within dynamic therex without need for postural cueing Baseline:  Goal status: INITIAL  5.  Pt will achieve LE flexibility to WNL bil to reduce undue strain on trunk, pelvis and hips. Baseline:  Goal status: INITIAL  6.  Pt will be able to sleep without pain interruption  Baseline:  Goal status: INITIAL  PLAN:  PT FREQUENCY: 2x/week  PT DURATION: 8 weeks  PLANNED INTERVENTIONS: Therapeutic exercises, Therapeutic activity, Neuromuscular re-education, Patient/Family  education, Self Care, Joint mobilization, Dry Needling, Electrical stimulation, Spinal mobilization, Cryotherapy, Moist heat, Taping, Ionotophoresis 4mg /ml Dexamethasone, and Manual therapy.  PLAN FOR NEXT SESSION: f/u on DN #3 to bil HS and review updates to HEP, work on [employer] flexiblity, progress core and functional LE strength   Lorrene Reid, PT 09/13/22 11:07 AM

## 2022-09-17 ENCOUNTER — Ambulatory Visit: Payer: 59 | Admitting: Physical Therapy

## 2022-09-17 ENCOUNTER — Encounter: Payer: Self-pay | Admitting: Physical Therapy

## 2022-09-17 DIAGNOSIS — M545 Low back pain, unspecified: Secondary | ICD-10-CM

## 2022-09-17 DIAGNOSIS — M25551 Pain in right hip: Secondary | ICD-10-CM

## 2022-09-17 NOTE — Therapy (Signed)
OUTPATIENT PHYSICAL THERAPY THORACOLUMBAR EVALUATION   Patient Name: SHEMUEL HARKLEROAD MRN: [id number] DOB:1960-12-26, 62 y.o., male Today's Date: 09/17/2022  END OF SESSION:  PT End of Session - 09/17/22 1235     Visit Number 5    Date for PT Re-Evaluation 10/16/22    Authorization Type [employer]    PT Start Time 1232    PT Stop Time 1315    PT Time Calculation (min) 43 min    Activity Tolerance Patient tolerated treatment well    Behavior During Therapy [facility] for tasks assessed/performed                 Past Medical History:  Diagnosis Date   Abnormal finding on MRI of brain 10/04/2014   Back pain    Colon polyp 02/19/2013   Dr. Loreta Ave - Repeat in 5 years   Constipation    Hip pain    History of pineal cyst    Hyperlipidemia    Memory loss    Past Surgical History:  Procedure Laterality Date   BICEPS TENDON REPAIR Left    COLONOSCOPY     ROTATOR CUFF REPAIR Right    TENNIS ELBOW RELEASE/NIRSCHEL PROCEDURE Left 08/31/2021   Procedure: TENNIS ELBOW RELEASE/NIRSCHEL PROCEDURE;  Surgeon: Teryl Lucy, MD;  Location: [facility];  Service: Orthopedics;  Laterality: Left;   TONSILLECTOMY     VASECTOMY     Patient Active Problem List   Diagnosis Date Noted   Allergic rhinitis 06/20/2020   Nonalcoholic steatohepatitis (NASH) 06/17/2018   Eczema 12/31/2016   Routine general medical examination at a health care facility 06/26/2016   Abnormal finding on MRI of brain 10/04/2014   Hyperlipidemia LDL goal <160 05/08/2013    PCP: Etta Grandchild MD  REFERRING PROVIDER: Ralene Cork, DO  REFERRING DIAG: [phone] (ICD-10-CM) - Bilateral hip pain M54.50 (ICD-10-CM) - Bilateral low back pain without sciatica, unspecified chronicity  Rationale for Evaluation and Treatment: Rehabilitation  THERAPY DIAG:  Bilateral hip pain  Bilateral low back pain without sciatica, unspecified chronicity  ONSET DATE: chronic  SUBJECTIVE:                                                                                                                                                                                            SUBJECTIVE STATEMENT: I feel like I'm making progress.  My back was really tight this morning.    Eval: Pt has chronic LBP and bil hip pain.  "I've always had LBP."  Pain is in Lt/Rt low back "in the dents" and in the hips behind the hip bones.  About 2  years ago I started having numbness in scrotum and did some ortho PT with some improvement but not really getting better.  If I sit on a stationary bike my groin will go numb in 15 min. My legs can go numb, Lt>Rt, typically in hamstring but can extend into back of calf. I usually do yoga on mat on floor - if I lay flat on back my legs can start going numb.  PERTINENT HISTORY:  Plays tennis   PAIN:  PAIN:  Are you having pain? Yes NPRS scale: 2-3/10 Pain location: Rt/Lt lumbar and bil hips laterally behind greater trochanters Pain orientation: Bilateral  PAIN TYPE: aching, sharp, and tight Pain description: constant  Aggravating factors: sitting, driving Relieving factors: movement, maybe yoga   PRECAUTIONS: None  WEIGHT BEARING RESTRICTIONS: No  FALLS:  Has patient fallen in last 6 months? No  LIVING ENVIRONMENT: Lives with: lives with their spouse Lives in: House/apartment   OCCUPATION: works for [employer] - sitting or driving  PLOF: Independent  PATIENT GOALS: improve symptoms  NEXT MD VISIT: as needed  OBJECTIVE:   DIAGNOSTIC FINDINGS:  IMPRESSION: 1. Facet arthropathy most advanced at L4-L5, with a small effusion on the left and periarticular edema and enhancement on the right. This may reflect a source of pain. 2. Diffuse disc bulge at L4-L5 resulting in narrowing of the subarticular zones without evidence of frank nerve root impingement, and mild bilateral neural foraminal stenosis with possible irritation of the exiting right L4 nerve root  by extraforaminal disc material. 3. Diffuse disc bulge at L5-S1 which contacts the traversing right S1 nerve root. 4. No other significant spinal canal or neural foraminal stenosis, and no other evidence of nerve root impingement.  Small volume of fluid in the trochanteric bursa bilaterally suggestive of bursitis.   Mild left hip degenerative change  PATIENT SURVEYS:  FOTO 56, goal 68%  SCREENING FOR RED FLAGS: Bowel or bladder incontinence: No Spinal tumors: No Cauda equina syndrome: No Compression fracture: No Abdominal aneurysm: No  COGNITION: Overall cognitive status: Within functional limits for tasks assessed     SENSATION: Pt reports numbness Lt>Rt posterior thigh sometimes into calf, comes and goes, scrotum will go numb with sitting >15' depending on seat  MUSCLE LENGTH: Hamstrings: Right 50 deg; Left 50 deg End range tightness Lt quad and bil ITB Increased tone of bil hamstrings and lateral gastroc  POSTURE:  sway back, hips shunted forward in sockets, overuse of gluteals - able to correct with VC  PALPATION: Increased tone of bil hamstrings and lateral gastroc Tender along bil lumbar mutlifidi TP present in bil gluteals, piriformis, lateral hamstring and lateral gastroc  LUMBAR ROM:   AROM eval  Flexion Full with LBP  Extension Full with LBP  Right lateral flexion Full Rt sided pain  Left lateral flexion Full Rt sided pain  Right rotation 50%  Left rotation 50%   (Blank rows = not tested)  LOWER EXTREMITY ROM:    Hips and knees WNL    LOWER EXTREMITY MMT:   5/5 bil LE   LUMBAR SPECIAL TESTS:  Straight leg raise test: Negative  FUNCTIONAL TESTS:  Able to perform SLS, able to deep squat and return to stand  GAIT: Distance walked: within clinic Assistive device utilized: None Level of assistance: Complete Independence Comments: stands and walks with sway back, pelvis anterior to trunk  TODAY'S TREATMENT:      DATE:  09/17/22: Recumbent  bike L3 x 5' PT present to discuss plan  for session Chair plank with mountain climber slow march alt LE x20 Chair plank with hip ext x10 each LE Supine dying bug x20, 90/90 heel tappers x 10, LE scissors x 10, bicycle x 20 Supine LTR 5x5" bil Fig 4 bridge x 10 each LE Bird dog to knee tap x 5 rounds hold 5 sec  Standing HS stretch foot on 3rd step 2x20" Standing deadlift 15lb kbell x10 - cueing and demo needed to avoid squat mechanics Standing T at counter 7lb dumbbell x 10 each LE  Deep tissue massage and stripping to bil hamstrings, medial and central focus, cross friction massage to bil hamstring tendons at ischial tuberosity   09/13/22: Recumbent bike L2 x 6'- discussed seat cushion for work Bil gastroc stretch with pro-stretch 2x20" bil Standing HS stretch foot on mat table 2x20" bil-doing better with standing version Standing T at counter 5lb kbell x 10 each LE  Bird dog to knee tap x 5 rounds hold 5 sec  Pallof press: red 2x10 bil each Prone forearm plank x 30"  Trigger Point Dry-Needling  Treatment instructions: Expect mild to moderate muscle soreness. S/S of pneumothorax if dry needled over a lung field, and to seek immediate medical attention should they occur. Patient verbalized understanding of these instructions and education.  Patient Consent Given: Yes Education handout provided: Previously provided Muscles treated: bil hamstrings Electrical stimulation performed: No Parameters: N/A Treatment response/outcome: mild twitches Rt>Lt and at more lateral and proximal points                                                                                                                          DATE:  7/1: Recumbent bike L2 x 4' Bil gastroc stretch with pro-stretch 2x20" bil Standing HS stretch foot on 3rd step 2x20" bil (HEP update vs sitting) Deadlift 10#, then 8#, difficulty with form Standing T at counter 5lb kbell x 10 each LE (HEP) Bird dog to knee tap x 5 rounds  hold 5 sec (HEP) Prone forearm plank x 30" (HEP) Trigger Point Dry-Needling  Treatment instructions: Expect mild to moderate muscle soreness. S/S of pneumothorax if dry needled over a lung field, and to seek immediate medical attention should they occur. Patient verbalized understanding of these instructions and education.  Patient Consent Given: Yes Education handout provided: Previously provided Muscles treated: bil hamstrings Electrical stimulation performed: No Parameters: N/A Treatment response/outcome: mild twitches Rt>Lt and at more lateral and proximal points   PATIENT EDUCATION:  Education details: anatomy of spine to go over imaging results of lumbar spine and pelvis/hips Person educated: Patient Education method: Explanation Education comprehension: verbalized understanding  HOME EXERCISE PROGRAM: Access Code: [email or web address] URL: [email or web address] Date: 09/03/2022 Prepared by: Loistine Simas Axell Trigueros  Exercises - Standing Quadriceps Stretch  - 1 x daily - 7 x weekly - 1 sets - 2-3 reps - 20-30 hold - Standing Hamstring Stretch on Chair  - 1 x daily - 7 x weekly - 1 sets - 2-3 reps -  20-30 hold - Standing Bilateral Gastroc Stretch with Step  - 1 x daily - 7 x weekly - 1 sets - 2-3 reps - 20-30 hold - Cat Cow  - 1 x daily - 7 x weekly - 1 sets - 10 reps - Supine Piriformis Stretch with Foot on Ground  - 1 x daily - 7 x weekly - 1 sets - 2-3 reps - 20-30 hold - Supine ITB Stretch with Strap  - 1 x daily - 7 x weekly - 1 sets - 2-3 reps - 20-30 hold - Single-Leg [place] Deadlift With Kettlebell  - 1 x daily - 7 x weekly - 1 sets - 10 reps - Bird Dog with Knee Taps  - 1 x daily - 7 x weekly - 1 sets - 5 reps - 5 hold - Standard Plank  - 1 x daily - 7 x weekly - 1 sets - 3 reps - 30 hold  ASSESSMENT:  CLINICAL IMPRESSION: Pt reports reduced intensity and frequency of shooting LE pain.  He continues to be very sore and with some numbness in saddle region and at  ischial tuberosities.  DN has helped release hamstrings which continue to be very tight.  Deep tissue techniques applied today to bil hamstrings after therex with good response.  Will DN at 2nd appt of the week this week.  Pt with good tolerance of abdominal therex progression today so will benefit from HEP progression next time.  Pt is getting more comfortable with mechanics of standing dead lift today.    OBJECTIVE IMPAIRMENTS: decreased mobility, decreased ROM, decreased strength, hypomobility, increased muscle spasms, impaired flexibility, improper body mechanics, postural dysfunction, and pain.   ACTIVITY LIMITATIONS: sitting, standing, and sleeping  PARTICIPATION LIMITATIONS: community activity and occupation  PERSONAL FACTORS: Time since onset of injury/illness/exacerbation are also affecting patient's functional outcome.   REHAB POTENTIAL: Excellent  CLINICAL DECISION MAKING: Stable/uncomplicated  EVALUATION COMPLEXITY: Low   GOALS: Goals reviewed with patient? Yes  SHORT TERM GOALS: Target date: 09/18/22  Pt will be ind with initial HEP Baseline: Goal status: ongoing  2.  Pt will achieve hamstring length of at least 65 deg bil Baseline: 50 deg bil Goal status: ongoing  3.  Pt will demo proper postural alignment in sitting and standing within sessions with intermittent cueing for correction needed. Baseline: working on corrections (09/13/22) Goal status: IN PROGRESS  4.  Pt will report reduction in pain with sitting for work by 25% Baseline:  Goal status: INITIAL    LONG TERM GOALS: Target date: 10/16/22  Pt will be ind with advanced HEP Baseline:  Goal status: INITIAL  2.  Pt will demo full trunk ROM without pain for improved functional task performance. Baseline:  Goal status: INITIAL  3.  Pt will be able to sit for up to 2 hours for work without exacerbation of pain and LE symptoms. Baseline:  Goal status: INITIAL  4.  Pt will demo proper stabilization  strategies within dynamic therex without need for postural cueing Baseline:  Goal status: INITIAL  5.  Pt will achieve LE flexibility to WNL bil to reduce undue strain on trunk, pelvis and hips. Baseline:  Goal status: INITIAL  6.  Pt will be able to sleep without pain interruption  Baseline:  Goal status: INITIAL  PLAN:  PT FREQUENCY: 2x/week  PT DURATION: 8 weeks  PLANNED INTERVENTIONS: Therapeutic exercises, Therapeutic activity, Neuromuscular re-education, Patient/Family education, Self Care, Joint mobilization, Dry Needling, Electrical stimulation, Spinal mobilization, Cryotherapy, Moist heat,  Taping, Ionotophoresis 4mg /ml Dexamethasone, and Manual therapy.  PLAN FOR NEXT SESSION: f/u on deep tissue massage to HS last time, update HEP for abdominal therex and deadlift, DN #3 to bil HS and review updates to HEP, work on HS flexiblity, progress core and functional LE strength   Karen Kinnard, PT 09/17/22 1:46 PM

## 2022-09-20 ENCOUNTER — Encounter: Payer: Self-pay | Admitting: Physical Therapy

## 2022-09-20 ENCOUNTER — Ambulatory Visit: Payer: 59 | Admitting: Physical Therapy

## 2022-09-20 DIAGNOSIS — M545 Low back pain, unspecified: Secondary | ICD-10-CM

## 2022-09-20 DIAGNOSIS — M25551 Pain in right hip: Secondary | ICD-10-CM | POA: Diagnosis not present

## 2022-09-20 NOTE — Therapy (Signed)
OUTPATIENT PHYSICAL THERAPY THORACOLUMBAR EVALUATION   Patient Name: Sean Cochran MRN: [id number] DOB:Jul 21, 1960, 62 y.o., male Today's Date: 09/20/2022  END OF SESSION:  PT End of Session - 09/20/22 0847     Visit Number 6    Date for PT Re-Evaluation 10/16/22    Authorization Type [employer]    PT Start Time [phone]    PT Stop Time 0930    PT Time Calculation (min) 44 min    Activity Tolerance Patient tolerated treatment well    Behavior During Therapy [facility] for tasks assessed/performed                  Past Medical History:  Diagnosis Date   Abnormal finding on MRI of brain 10/04/2014   Back pain    Colon polyp 02/19/2013   Dr. Loreta Ave - Repeat in 5 years   Constipation    Hip pain    History of pineal cyst    Hyperlipidemia    Memory loss    Past Surgical History:  Procedure Laterality Date   BICEPS TENDON REPAIR Left    COLONOSCOPY     ROTATOR CUFF REPAIR Right    TENNIS ELBOW RELEASE/NIRSCHEL PROCEDURE Left 08/31/2021   Procedure: TENNIS ELBOW RELEASE/NIRSCHEL PROCEDURE;  Surgeon: Teryl Lucy, MD;  Location: [facility];  Service: Orthopedics;  Laterality: Left;   TONSILLECTOMY     VASECTOMY     Patient Active Problem List   Diagnosis Date Noted   Allergic rhinitis 06/20/2020   Nonalcoholic steatohepatitis (NASH) 06/17/2018   Eczema 12/31/2016   Routine general medical examination at a health care facility 06/26/2016   Abnormal finding on MRI of brain 10/04/2014   Hyperlipidemia LDL goal <160 05/08/2013    PCP: Etta Grandchild MD  REFERRING PROVIDER: Ralene Cork, DO  REFERRING DIAG: [id number] (ICD-10-CM) - Bilateral hip pain M54.50 (ICD-10-CM) - Bilateral low back pain without sciatica, unspecified chronicity  Rationale for Evaluation and Treatment: Rehabilitation  THERAPY DIAG:  Bilateral hip pain  Bilateral low back pain without sciatica, unspecified chronicity  ONSET DATE: chronic  SUBJECTIVE:                                                                                                                                                                                            SUBJECTIVE STATEMENT: I have been working on the dead lift and am sore in a good way.  I am getting closer to floor on standing T so I think my hamstrings are loosening up.   Eval: Pt has chronic LBP and bil hip pain.  "I've always had LBP."  Pain  is in Lt/Rt low back "in the dents" and in the hips behind the hip bones.  About 2 years ago I started having numbness in scrotum and did some ortho PT with some improvement but not really getting better.  If I sit on a stationary bike my groin will go numb in 15 min. My legs can go numb, Lt>Rt, typically in hamstring but can extend into back of calf. I usually do yoga on mat on floor - if I lay flat on back my legs can start going numb.  PERTINENT HISTORY:  Plays tennis   PAIN:  PAIN:  Are you having pain? Yes NPRS scale: 2-3/10 Pain location: Rt/Lt lumbar and bil hips laterally behind greater trochanters Pain orientation: Bilateral  PAIN TYPE: aching, sharp, and tight Pain description: constant  Aggravating factors: sitting, driving Relieving factors: movement, maybe yoga   PRECAUTIONS: None  WEIGHT BEARING RESTRICTIONS: No  FALLS:  Has patient fallen in last 6 months? No  LIVING ENVIRONMENT: Lives with: lives with their spouse Lives in: House/apartment   OCCUPATION: works for [employer] - sitting or driving  PLOF: Independent  PATIENT GOALS: improve symptoms  NEXT MD VISIT: as needed  OBJECTIVE:   DIAGNOSTIC FINDINGS:  IMPRESSION: 1. Facet arthropathy most advanced at L4-L5, with a small effusion on the left and periarticular edema and enhancement on the right. This may reflect a source of pain. 2. Diffuse disc bulge at L4-L5 resulting in narrowing of the subarticular zones without evidence of frank nerve root impingement, and mild bilateral neural  foraminal stenosis with possible irritation of the exiting right L4 nerve root by extraforaminal disc material. 3. Diffuse disc bulge at L5-S1 which contacts the traversing right S1 nerve root. 4. No other significant spinal canal or neural foraminal stenosis, and no other evidence of nerve root impingement.  Small volume of fluid in the trochanteric bursa bilaterally suggestive of bursitis.   Mild left hip degenerative change  PATIENT SURVEYS:  FOTO 56, goal 68%  SCREENING FOR RED FLAGS: Bowel or bladder incontinence: No Spinal tumors: No Cauda equina syndrome: No Compression fracture: No Abdominal aneurysm: No  COGNITION: Overall cognitive status: Within functional limits for tasks assessed     SENSATION: Pt reports numbness Lt>Rt posterior thigh sometimes into calf, comes and goes, scrotum will go numb with sitting >15' depending on seat  MUSCLE LENGTH: Hamstrings: Right 50 deg; Left 50 deg End range tightness Lt quad and bil ITB Increased tone of bil hamstrings and lateral gastroc  POSTURE:  sway back, hips shunted forward in sockets, overuse of gluteals - able to correct with VC  PALPATION: Increased tone of bil hamstrings and lateral gastroc Tender along bil lumbar mutlifidi TP present in bil gluteals, piriformis, lateral hamstring and lateral gastroc  LUMBAR ROM:   AROM eval  Flexion Full with LBP  Extension Full with LBP  Right lateral flexion Full Rt sided pain  Left lateral flexion Full Rt sided pain  Right rotation 50%  Left rotation 50%   (Blank rows = not tested)  LOWER EXTREMITY ROM:    Hips and knees WNL    LOWER EXTREMITY MMT:   5/5 bil LE   LUMBAR SPECIAL TESTS:  Straight leg raise test: Negative  FUNCTIONAL TESTS:  Able to perform SLS, able to deep squat and return to stand  GAIT: Distance walked: within clinic Assistive device utilized: None Level of assistance: Complete Independence Comments: stands and walks with sway back,  pelvis anterior to trunk  TODAY'S  TREATMENT:      DATE:  09/20/22: Recumbent bike L3 x 4' PT present to discuss plan for session Chair plank with mountain climber slow march alt LE x20 Chair plank with hip ext x10 each LE Supine dying bug x20, 90/90 heel tappers x 10, LE scissors x 10, bicycle x 20 Supine LTR 2x5" bil Fig 4 bridge x 10 each LE Bird dog to knee tap x 3 rounds hold 5 sec  15lb kbell standing deadlift x 12 - much improved technique today without cueing Standing T 10lb single UE support x10 each  Trigger Point Dry-Needling  Treatment instructions: Expect mild to moderate muscle soreness. S/S of pneumothorax if dry needled over a lung field, and to seek immediate medical attention should they occur. Patient verbalized understanding of these instructions and education.  Patient Consent Given: Yes Education handout provided: Previously provided Muscles treated: bil med and lateral hamstrings Electrical stimulation performed: No Parameters: N/A Treatment response/outcome: signif twitch, deep ache, release and elongation of tissue   09/17/22: Recumbent bike L3 x 5' PT present to discuss plan for session Chair plank with mountain climber slow march alt LE x20 Chair plank with hip ext x10 each LE Supine dying bug x20, 90/90 heel tappers x 10, LE scissors x 10, bicycle x 20 Supine LTR 5x5" bil Fig 4 bridge x 10 each LE Bird dog to knee tap x 5 rounds hold 5 sec  Standing HS stretch foot on 3rd step 2x20" Standing deadlift 15lb kbell x10 - cueing and demo needed to avoid squat mechanics Standing T at counter 7lb dumbbell x 10 each LE  Deep tissue massage and stripping to bil hamstrings, medial and central focus, cross friction massage to bil hamstring tendons at ischial tuberosity   09/13/22: Recumbent bike L2 x 6'- discussed seat cushion for work Bil gastroc stretch with pro-stretch 2x20" bil Standing HS stretch foot on mat table 2x20" bil-doing better with standing  version Standing T at counter 5lb kbell x 10 each LE  Bird dog to knee tap x 5 rounds hold 5 sec  Pallof press: red 2x10 bil each Prone forearm plank x 30"  Trigger Point Dry-Needling  Treatment instructions: Expect mild to moderate muscle soreness. S/S of pneumothorax if dry needled over a lung field, and to seek immediate medical attention should they occur. Patient verbalized understanding of these instructions and education.  Patient Consent Given: Yes Education handout provided: Previously provided Muscles treated: bil hamstrings Electrical stimulation performed: No Parameters: N/A Treatment response/outcome: mild twitches Rt>Lt and at more lateral and proximal points                                                                                                                            PATIENT EDUCATION:  Education details: anatomy of spine to go over imaging results of lumbar spine and pelvis/hips Person educated: Patient Education method: Explanation Education comprehension: verbalized understanding  HOME EXERCISE PROGRAM: Access Code: [email or web address] URL: [email or web address]  Date: 09/20/2022 Prepared by: Loistine Simas Sonnet Rizor  Exercises - Standing Quadriceps Stretch  - 1 x daily - 7 x weekly - 1 sets - 2-3 reps - 20-30 hold - Standing Hamstring Stretch on Chair  - 1 x daily - 7 x weekly - 1 sets - 2-3 reps - 20-30 hold - Standing Bilateral Gastroc Stretch with Step  - 1 x daily - 7 x weekly - 1 sets - 2-3 reps - 20-30 hold - Cat Cow  - 1 x daily - 7 x weekly - 1 sets - 10 reps - Supine Piriformis Stretch with Foot on Ground  - 1 x daily - 7 x weekly - 1 sets - 2-3 reps - 20-30 hold - Supine ITB Stretch with Strap  - 1 x daily - 7 x weekly - 1 sets - 2-3 reps - 20-30 hold - Single-Leg [place] Deadlift With Kettlebell  - 1 x daily - 7 x weekly - 1 sets - 10 reps - Bird Dog with Knee Taps  - 1 x daily - 7 x weekly - 1 sets - 5 reps - 5 hold - Standard Plank  - 1 x  daily - 7 x weekly - 1 sets - 3 reps - 30 hold - Supine 90/90 Alternating Toe Touch  - 1 x daily - 7 x weekly - 1 sets - 10 reps - Supine Dead Bug with Leg Extension  - 1 x daily - 7 x weekly - 1 sets - 10 reps - Supine Leg Scissors  - 1 x daily - 7 x weekly - 1 sets - 10 reps - Supine Bicycles  - 1 x daily - 7 x weekly - 1 sets - 10 reps - Figure 4 Bridge  - 1 x daily - 7 x weekly - 1 sets - 10 reps - Mountain Climbers Slow  - 1 x daily - 7 x weekly - 1 sets - 10 reps - Plank with Hip Extension  - 1 x daily - 7 x weekly - 1 sets - 10 reps - Standing Deadlift with Barbell - Knees Straight  - 1 x daily - 7 x weekly - 1 sets - 10 reps  ASSESSMENT:  CLINICAL IMPRESSION: Pt with improving depth of movement with standing T as his hamstring flexibility improves.  He was able to demo proper form with standing deadlift today.  We discussed how to slowly add load to this exercise using 2 dumbbells at home (2x12lb).  PT progressed HEP to include more core, lumbar and glut strength.  Good response to TPDN to bil hamstrings today with twitch and elongation.  OBJECTIVE IMPAIRMENTS: decreased mobility, decreased ROM, decreased strength, hypomobility, increased muscle spasms, impaired flexibility, improper body mechanics, postural dysfunction, and pain.   ACTIVITY LIMITATIONS: sitting, standing, and sleeping  PARTICIPATION LIMITATIONS: community activity and occupation  PERSONAL FACTORS: Time since onset of injury/illness/exacerbation are also affecting patient's functional outcome.   REHAB POTENTIAL: Excellent  CLINICAL DECISION MAKING: Stable/uncomplicated  EVALUATION COMPLEXITY: Low   GOALS: Goals reviewed with patient? Yes  SHORT TERM GOALS: Target date: 09/18/22  Pt will be ind with initial HEP Baseline: Goal status: ongoing  2.  Pt will achieve hamstring length of at least 65 deg bil Baseline: 50 deg bil Goal status: ongoing  3.  Pt will demo proper postural alignment in sitting and  standing within sessions with intermittent cueing for correction needed. Baseline: working on corrections (09/13/22) Goal status: IN PROGRESS  4.  Pt will report reduction in  pain with sitting for work by 25% Baseline:  Goal status: INITIAL    LONG TERM GOALS: Target date: 10/16/22  Pt will be ind with advanced HEP Baseline:  Goal status: INITIAL  2.  Pt will demo full trunk ROM without pain for improved functional task performance. Baseline:  Goal status: INITIAL  3.  Pt will be able to sit for up to 2 hours for work without exacerbation of pain and LE symptoms. Baseline:  Goal status: INITIAL  4.  Pt will demo proper stabilization strategies within dynamic therex without need for postural cueing Baseline:  Goal status: INITIAL  5.  Pt will achieve LE flexibility to WNL bil to reduce undue strain on trunk, pelvis and hips. Baseline:  Goal status: INITIAL  6.  Pt will be able to sleep without pain interruption  Baseline:  Goal status: INITIAL  PLAN:  PT FREQUENCY: 2x/week  PT DURATION: 8 weeks  PLANNED INTERVENTIONS: Therapeutic exercises, Therapeutic activity, Neuromuscular re-education, Patient/Family education, Self Care, Joint mobilization, Dry Needling, Electrical stimulation, Spinal mobilization, Cryotherapy, Moist heat, Taping, Ionotophoresis 4mg /ml Dexamethasone, and Manual therapy.  PLAN FOR NEXT SESSION: f/u on DN to bil HS last time, review updated HEP for abdominal therex and deadlift, DN #3 to bil HS and review updates to HEP, work on HS flexiblity, progress core and functional LE strength   Jaanai Salemi, PT 09/20/22 10:21 AM

## 2022-09-24 ENCOUNTER — Ambulatory Visit: Payer: 59

## 2022-09-24 DIAGNOSIS — M25522 Pain in left elbow: Secondary | ICD-10-CM

## 2022-09-24 DIAGNOSIS — M25551 Pain in right hip: Secondary | ICD-10-CM

## 2022-09-24 DIAGNOSIS — M6281 Muscle weakness (generalized): Secondary | ICD-10-CM

## 2022-09-24 DIAGNOSIS — M545 Low back pain, unspecified: Secondary | ICD-10-CM

## 2022-09-24 NOTE — Therapy (Addendum)
OUTPATIENT PHYSICAL THERAPY THORACOLUMBAR EVALUATION   Patient Name: Sean Cochran MRN: [id number] DOB:05/31/1960, 62 y.o., male Today's Date: 09/24/2022  END OF SESSION:  PT End of Session - 09/24/22 1021     Visit Number 7    Date for PT Re-Evaluation 10/16/22    Authorization Type [employer]    PT Start Time [phone]    PT Stop Time 0930    PT Time Calculation (min) 43 min    Activity Tolerance Patient tolerated treatment well    Behavior During Therapy [facility] for tasks assessed/performed                   Past Medical History:  Diagnosis Date   Abnormal finding on MRI of brain 10/04/2014   Back pain    Colon polyp 02/19/2013   Dr. Loreta Ave - Repeat in 5 years   Constipation    Hip pain    History of pineal cyst    Hyperlipidemia    Memory loss    Past Surgical History:  Procedure Laterality Date   BICEPS TENDON REPAIR Left    COLONOSCOPY     ROTATOR CUFF REPAIR Right    TENNIS ELBOW RELEASE/NIRSCHEL PROCEDURE Left 08/31/2021   Procedure: TENNIS ELBOW RELEASE/NIRSCHEL PROCEDURE;  Surgeon: Teryl Lucy, MD;  Location: [facility];  Service: Orthopedics;  Laterality: Left;   TONSILLECTOMY     VASECTOMY     Patient Active Problem List   Diagnosis Date Noted   Allergic rhinitis 06/20/2020   Nonalcoholic steatohepatitis (NASH) 06/17/2018   Eczema 12/31/2016   Routine general medical examination at a health care facility 06/26/2016   Abnormal finding on MRI of brain 10/04/2014   Hyperlipidemia LDL goal <160 05/08/2013    PCP: Etta Grandchild MD  REFERRING PROVIDER: Ralene Cork, DO  REFERRING DIAG: [phone] (ICD-10-CM) - Bilateral hip pain M54.50 (ICD-10-CM) - Bilateral low back pain without sciatica, unspecified chronicity  Rationale for Evaluation and Treatment: Rehabilitation  THERAPY DIAG:  Bilateral hip pain  Bilateral low back pain without sciatica, unspecified chronicity  Pain in left elbow  Muscle weakness  (generalized)  ONSET DATE: chronic  SUBJECTIVE:                                                                                                                                                                                           SUBJECTIVE STATEMENT: I was a little sore after needling but I feel ike we are making progress.  I'm able to get closer to the floor with my dead lift.     Eval: Pt has chronic LBP and bil hip pain.  "  I've always had LBP."  Pain is in Lt/Rt low back "in the dents" and in the hips behind the hip bones.  About 2 years ago I started having numbness in scrotum and did some ortho PT with some improvement but not really getting better.  If I sit on a stationary bike my groin will go numb in 15 min. My legs can go numb, Lt>Rt, typically in hamstring but can extend into back of calf. I usually do yoga on mat on floor - if I lay flat on back my legs can start going numb.  PERTINENT HISTORY:  Plays tennis   PAIN:  PAIN:  Are you having pain? Yes NPRS scale: 2/10 Pain location: Rt/Lt lumbar and bil hips laterally behind greater trochanters Pain orientation: Bilateral  PAIN TYPE: aching, sharp, and tight Pain description: constant  Aggravating factors: sitting, driving Relieving factors: movement, maybe yoga   PRECAUTIONS: None  WEIGHT BEARING RESTRICTIONS: No  FALLS:  Has patient fallen in last 6 months? No  LIVING ENVIRONMENT: Lives with: lives with their spouse Lives in: House/apartment   OCCUPATION: works for [employer] - sitting or driving  PLOF: Independent  PATIENT GOALS: improve symptoms  NEXT MD VISIT: as needed  OBJECTIVE:   DIAGNOSTIC FINDINGS:  IMPRESSION: 1. Facet arthropathy most advanced at L4-L5, with a small effusion on the left and periarticular edema and enhancement on the right. This may reflect a source of pain. 2. Diffuse disc bulge at L4-L5 resulting in narrowing of the subarticular zones without evidence of  frank nerve root impingement, and mild bilateral neural foraminal stenosis with possible irritation of the exiting right L4 nerve root by extraforaminal disc material. 3. Diffuse disc bulge at L5-S1 which contacts the traversing right S1 nerve root. 4. No other significant spinal canal or neural foraminal stenosis, and no other evidence of nerve root impingement.  Small volume of fluid in the trochanteric bursa bilaterally suggestive of bursitis.   Mild left hip degenerative change  PATIENT SURVEYS:  FOTO 56, goal 68%  SCREENING FOR RED FLAGS: Bowel or bladder incontinence: No Spinal tumors: No Cauda equina syndrome: No Compression fracture: No Abdominal aneurysm: No  COGNITION: Overall cognitive status: Within functional limits for tasks assessed     SENSATION: Pt reports numbness Lt>Rt posterior thigh sometimes into calf, comes and goes, scrotum will go numb with sitting >15' depending on seat  MUSCLE LENGTH: Hamstrings: Right 50 deg; Left 50 deg End range tightness Lt quad and bil ITB Increased tone of bil hamstrings and lateral gastroc  POSTURE:  sway back, hips shunted forward in sockets, overuse of gluteals - able to correct with VC  PALPATION: Increased tone of bil hamstrings and lateral gastroc Tender along bil lumbar mutlifidi TP present in bil gluteals, piriformis, lateral hamstring and lateral gastroc  LUMBAR ROM:   AROM eval  Flexion Full with LBP  Extension Full with LBP  Right lateral flexion Full Rt sided pain  Left lateral flexion Full Rt sided pain  Right rotation 50%  Left rotation 50%   (Blank rows = not tested)  LOWER EXTREMITY ROM:    Hips and knees WNL    LOWER EXTREMITY MMT:   5/5 bil LE   LUMBAR SPECIAL TESTS:  Straight leg raise test: Negative  FUNCTIONAL TESTS:  Able to perform SLS, able to deep squat and return to stand  GAIT: Distance walked: within clinic Assistive device utilized: None Level of assistance: Complete  Independence Comments: stands and walks with sway back,  pelvis anterior to trunk  TODAY'S TREATMENT:     09/20/22: Recumbent bike L3 x 5' PT present to discuss plan for session Chair plank with mountain climber slow march alt LE x20 Chair plank with hip ext x10 each LE Supine dying bug x20, 90/90 heel tappers x 10, LE scissors x 10, bicycle x 20 Supine LTR 2x5" bil Fig 4 bridge x 10 each LE Bird dog to knee tap x 3 rounds hold 5 sec  15lb kbell standing deadlift x 12 - much improved technique today without cueing Standing T 10lb single UE support x10 each  Trigger Point Dry-Needling  Treatment instructions: Expect mild to moderate muscle soreness. S/S of pneumothorax if dry needled over a lung field, and to seek immediate medical attention should they occur. Patient verbalized understanding of these instructions and education.  Patient Consent Given: Yes Education handout provided: Previously provided Muscles treated: bil med and lateral hamstrings Electrical stimulation performed: No Parameters: N/A Treatment response/outcome: signif twitch, deep ache, release and elongation of tissue   DATE:  09/20/22: Recumbent bike L3 x 4' PT present to discuss plan for session Chair plank with mountain climber slow march alt LE x20 Chair plank with hip ext x10 each LE Supine dying bug x20, 90/90 heel tappers x 10, LE scissors x 10, bicycle x 20 Supine LTR 2x5" bil Fig 4 bridge x 10 each LE Bird dog to knee tap x 3 rounds hold 5 sec  15lb kbell standing deadlift x 12 - much improved technique today without cueing Standing T 10lb single UE support x10 each  Trigger Point Dry-Needling  Treatment instructions: Expect mild to moderate muscle soreness. S/S of pneumothorax if dry needled over a lung field, and to seek immediate medical attention should they occur. Patient verbalized understanding of these instructions and education.  Patient Consent Given: Yes Education handout provided: Previously  provided Muscles treated: bil med and lateral hamstrings Electrical stimulation performed: No Parameters: N/A Treatment response/outcome: signif twitch, deep ache, release and elongation of tissue   09/17/22: Recumbent bike L3 x 5' PT present to discuss plan for session Chair plank with mountain climber slow march alt LE x20 Chair plank with hip ext x10 each LE Supine dying bug x20, 90/90 heel tappers x 10, LE scissors x 10, bicycle x 20 Supine LTR 5x5" bil Fig 4 bridge x 10 each LE Bird dog to knee tap x 5 rounds hold 5 sec  Standing HS stretch foot on 3rd step 2x20" Standing deadlift 15lb kbell x10 - cueing and demo needed to avoid squat mechanics Standing T at counter 7lb dumbbell x 10 each LE  Deep tissue massage and stripping to bil hamstrings, medial and central focus, cross friction massage to bil hamstring tendons at ischial tuberosity    PATIENT EDUCATION:  Education details: anatomy of spine to go over imaging results of lumbar spine and pelvis/hips Person educated: Patient Education method: Explanation Education comprehension: verbalized understanding  HOME EXERCISE PROGRAM: Access Code: [email or web address] URL: [email or web address] Date: 09/20/2022 Prepared by: Loistine Simas Beuhring  Exercises - Standing Quadriceps Stretch  - 1 x daily - 7 x weekly - 1 sets - 2-3 reps - 20-30 hold - Standing Hamstring Stretch on Chair  - 1 x daily - 7 x weekly - 1 sets - 2-3 reps - 20-30 hold - Standing Bilateral Gastroc Stretch with Step  - 1 x daily - 7 x weekly - 1 sets - 2-3 reps - 20-30 hold - Cat Cow  - 1 x daily -  7 x weekly - 1 sets - 10 reps - Supine Piriformis Stretch with Foot on Ground  - 1 x daily - 7 x weekly - 1 sets - 2-3 reps - 20-30 hold - Supine ITB Stretch with Strap  - 1 x daily - 7 x weekly - 1 sets - 2-3 reps - 20-30 hold - Single-Leg [place] Deadlift With Kettlebell  - 1 x daily - 7 x weekly - 1 sets - 10 reps - Bird Dog with Knee Taps  - 1 x daily - 7 x  weekly - 1 sets - 5 reps - 5 hold - Standard Plank  - 1 x daily - 7 x weekly - 1 sets - 3 reps - 30 hold - Supine 90/90 Alternating Toe Touch  - 1 x daily - 7 x weekly - 1 sets - 10 reps - Supine Dead Bug with Leg Extension  - 1 x daily - 7 x weekly - 1 sets - 10 reps - Supine Leg Scissors  - 1 x daily - 7 x weekly - 1 sets - 10 reps - Supine Bicycles  - 1 x daily - 7 x weekly - 1 sets - 10 reps - Figure 4 Bridge  - 1 x daily - 7 x weekly - 1 sets - 10 reps - Mountain Climbers Slow  - 1 x daily - 7 x weekly - 1 sets - 10 reps - Plank with Hip Extension  - 1 x daily - 7 x weekly - 1 sets - 10 reps - Standing Deadlift with Barbell - Knees Straight  - 1 x daily - 7 x weekly - 1 sets - 10 reps  ASSESSMENT:  CLINICAL IMPRESSION: Pt reports that he is sleeping without pain, hamstring length is improved and he is sitting on a cushion for work.  He demonstrates improved technique and core stability with exercise today.  Good response to TPDN to bil hamstrings today with twitch and elongation.Patient will benefit from skilled PT to address the below impairments and improve overall function.   OBJECTIVE IMPAIRMENTS: decreased mobility, decreased ROM, decreased strength, hypomobility, increased muscle spasms, impaired flexibility, improper body mechanics, postural dysfunction, and pain.   ACTIVITY LIMITATIONS: sitting, standing, and sleeping  PARTICIPATION LIMITATIONS: community activity and occupation  PERSONAL FACTORS: Time since onset of injury/illness/exacerbation are also affecting patient's functional outcome.   REHAB POTENTIAL: Excellent  CLINICAL DECISION MAKING: Stable/uncomplicated  EVALUATION COMPLEXITY: Low   GOALS: Goals reviewed with patient? Yes  SHORT TERM GOALS: Target date: 09/18/22  Pt will be ind with initial HEP Baseline: Goal status: ongoing  2.  Pt will achieve hamstring length of at least 65 deg bil Baseline: 50 deg bil Goal status: ongoing  3.  Pt will demo  proper postural alignment in sitting and standing within sessions with intermittent cueing for correction needed. Baseline: few to no cues required  Goal status: MET  4.  Pt will report reduction in pain with sitting for work by 25% Baseline:  Goal status: INITIAL    LONG TERM GOALS: Target date: 10/16/22  Pt will be ind with advanced HEP Baseline:  Goal status: INITIAL  2.  Pt will demo full trunk ROM without pain for improved functional task performance. Baseline:  Goal status: INITIAL  3.  Pt will be able to sit for up to 2 hours for work without exacerbation of pain and LE symptoms. Baseline: sitting on cushion so can sit a little bit longer (09/24/22) Goal status: MET  4.  Pt will demo proper stabilization strategies within dynamic therex without need for postural cueing Baseline:  Goal status: INITIAL  5.  Pt will achieve LE flexibility to WNL bil to reduce undue strain on trunk, pelvis and hips. Baseline:  Goal status: INITIAL  6.  Pt will be able to sleep without pain interruption  Baseline: sleeping without pain (09/24/22) Goal status: MET  PLAN:  PT FREQUENCY: 2x/week  PT DURATION: 8 weeks  PLANNED INTERVENTIONS: Therapeutic exercises, Therapeutic activity, Neuromuscular re-education, Patient/Family education, Self Care, Joint mobilization, Dry Needling, Electrical stimulation, Spinal mobilization, Cryotherapy, Moist heat, Taping, Ionotophoresis 4mg /ml Dexamethasone, and Manual therapy.  PLAN FOR NEXT SESSION: Continue core strength progression, DN as needed  Lorrene Reid, PT 09/24/22 10:23 AM

## 2022-10-03 ENCOUNTER — Ambulatory Visit: Payer: 59

## 2022-10-03 DIAGNOSIS — M25522 Pain in left elbow: Secondary | ICD-10-CM

## 2022-10-03 DIAGNOSIS — M25551 Pain in right hip: Secondary | ICD-10-CM | POA: Diagnosis not present

## 2022-10-03 DIAGNOSIS — M545 Low back pain, unspecified: Secondary | ICD-10-CM

## 2022-10-03 NOTE — Therapy (Signed)
OUTPATIENT PHYSICAL THERAPY TREATMENT   Patient Name: Sean Cochran MRN: [id number] DOB:1960/10/05, 62 y.o., male Today's Date: 10/03/2022  END OF SESSION:  PT End of Session - 10/03/22 0910     Visit Number 8    Date for PT Re-Evaluation 10/16/22    Authorization Type [employer]    PT Start Time 0847    PT Stop Time 0928    PT Time Calculation (min) 41 min    Activity Tolerance Patient tolerated treatment well    Behavior During Therapy [facility] for tasks assessed/performed                    Past Medical History:  Diagnosis Date   Abnormal finding on MRI of brain 10/04/2014   Back pain    Colon polyp 02/19/2013   Dr. Loreta Ave - Repeat in 5 years   Constipation    Hip pain    History of pineal cyst    Hyperlipidemia    Memory loss    Past Surgical History:  Procedure Laterality Date   BICEPS TENDON REPAIR Left    COLONOSCOPY     ROTATOR CUFF REPAIR Right    TENNIS ELBOW RELEASE/NIRSCHEL PROCEDURE Left 08/31/2021   Procedure: TENNIS ELBOW RELEASE/NIRSCHEL PROCEDURE;  Surgeon: Teryl Lucy, MD;  Location: [facility];  Service: Orthopedics;  Laterality: Left;   TONSILLECTOMY     VASECTOMY     Patient Active Problem List   Diagnosis Date Noted   Allergic rhinitis 06/20/2020   Nonalcoholic steatohepatitis (NASH) 06/17/2018   Eczema 12/31/2016   Routine general medical examination at a health care facility 06/26/2016   Abnormal finding on MRI of brain 10/04/2014   Hyperlipidemia LDL goal <160 05/08/2013    PCP: Etta Grandchild MD  REFERRING PROVIDER: Ralene Cork, DO  REFERRING DIAG: [phone] (ICD-10-CM) - Bilateral hip pain M54.50 (ICD-10-CM) - Bilateral low back pain without sciatica, unspecified chronicity  Rationale for Evaluation and Treatment: Rehabilitation  THERAPY DIAG:  Bilateral hip pain  Bilateral low back pain without sciatica, unspecified chronicity  Pain in left elbow  ONSET DATE: chronic  SUBJECTIVE:                                                                                                                                                                                            SUBJECTIVE STATEMENT: My back pain is flared up due to travel that was stressful.    Eval: Pt has chronic LBP and bil hip pain.  "I've always had LBP."  Pain is in Lt/Rt low back "in the dents" and in the hips behind the  hip bones.  About 2 years ago I started having numbness in scrotum and did some ortho PT with some improvement but not really getting better.  If I sit on a stationary bike my groin will go numb in 15 min. My legs can go numb, Lt>Rt, typically in hamstring but can extend into back of calf. I usually do yoga on mat on floor - if I lay flat on back my legs can start going numb.  PERTINENT HISTORY:  Plays tennis   PAIN:  PAIN:  Are you having pain? Yes NPRS scale: 2/10 Pain location: bil gluteals and lumbar multifidi Pain orientation: Bilateral  PAIN TYPE: aching, sharp, and tight Pain description: constant  Aggravating factors: sitting, driving Relieving factors: movement, maybe yoga   PRECAUTIONS: None  WEIGHT BEARING RESTRICTIONS: No  FALLS:  Has patient fallen in last 6 months? No  LIVING ENVIRONMENT: Lives with: lives with their spouse Lives in: House/apartment   OCCUPATION: works for [employer] - sitting or driving  PLOF: Independent  PATIENT GOALS: improve symptoms  NEXT MD VISIT: as needed  OBJECTIVE:   DIAGNOSTIC FINDINGS:  IMPRESSION: 1. Facet arthropathy most advanced at L4-L5, with a small effusion on the left and periarticular edema and enhancement on the right. This may reflect a source of pain. 2. Diffuse disc bulge at L4-L5 resulting in narrowing of the subarticular zones without evidence of frank nerve root impingement, and mild bilateral neural foraminal stenosis with possible irritation of the exiting right L4 nerve root by extraforaminal  disc material. 3. Diffuse disc bulge at L5-S1 which contacts the traversing right S1 nerve root. 4. No other significant spinal canal or neural foraminal stenosis, and no other evidence of nerve root impingement.  Small volume of fluid in the trochanteric bursa bilaterally suggestive of bursitis.   Mild left hip degenerative change  PATIENT SURVEYS:  FOTO 56, goal 68%  SCREENING FOR RED FLAGS: Bowel or bladder incontinence: No Spinal tumors: No Cauda equina syndrome: No Compression fracture: No Abdominal aneurysm: No  COGNITION: Overall cognitive status: Within functional limits for tasks assessed     SENSATION: Pt reports numbness Lt>Rt posterior thigh sometimes into calf, comes and goes, scrotum will go numb with sitting >15' depending on seat  MUSCLE LENGTH: Hamstrings: Right 50 deg; Left 50 deg End range tightness Lt quad and bil ITB Increased tone of bil hamstrings and lateral gastroc  POSTURE:  sway back, hips shunted forward in sockets, overuse of gluteals - able to correct with VC  PALPATION: Increased tone of bil hamstrings and lateral gastroc Tender along bil lumbar mutlifidi TP present in bil gluteals, piriformis, lateral hamstring and lateral gastroc  LUMBAR ROM:   AROM eval  Flexion Full with LBP  Extension Full with LBP  Right lateral flexion Full Rt sided pain  Left lateral flexion Full Rt sided pain  Right rotation 50%  Left rotation 50%   (Blank rows = not tested)  LOWER EXTREMITY ROM:    Hips and knees WNL    LOWER EXTREMITY MMT:   5/5 bil LE   LUMBAR SPECIAL TESTS:  Straight leg raise test: Negative  FUNCTIONAL TESTS:  Able to perform SLS, able to deep squat and return to stand  GAIT: Distance walked: within clinic Assistive device utilized: None Level of assistance: Complete Independence Comments: stands and walks with sway back, pelvis anterior to trunk  TODAY'S TREATMENT:     10/03/22: Recumbent bike L5 x 5' PT present to  discuss plan for session  Open book x 10 with foam roll Supine dying bug x20, 90/90 heel tappers x 10, LE scissors x 10, bicycle x 20 Supine LTR 2x5" bil Fig 4 bridge stretch in supine 15lb kbell standing deadlift x 12 - much improved technique today without cueing Standing T 10lb single UE support x10 each  Trigger Point Dry-Needling  Treatment instructions: Expect mild to moderate muscle soreness. S/S of pneumothorax if dry needled over a lung field, and to seek immediate medical attention should they occur. Patient verbalized understanding of these instructions and education.  Patient Consent Given: Yes Education handout provided: Previously provided Muscles treated: bil gluteals and lumbar spine  Elongation and release after DN Treatment response/outcome: signif twitch, deep ache, release and elongation of tissue 09/24/22: Recumbent bike L3 x 5' PT present to discuss plan for session Chair plank with mountain climber slow march alt LE x20 Chair plank with hip ext x10 each LE Supine dying bug x20, 90/90 heel tappers x 10, LE scissors x 10, bicycle x 20 Supine LTR 2x5" bil Fig 4 bridge x 10 each LE Bird dog to knee tap x 3 rounds hold 5 sec  15lb kbell standing deadlift x 12 - much improved technique today without cueing Standing T 10lb single UE support x10 each  Trigger Point Dry-Needling  Treatment instructions: Expect mild to moderate muscle soreness. S/S of pneumothorax if dry needled over a lung field, and to seek immediate medical attention should they occur. Patient verbalized understanding of these instructions and education.  Patient Consent Given: Yes Education handout provided: Previously provided Muscles treated: bil med and lateral hamstrings Electrical stimulation performed: No Parameters: N/A Treatment response/outcome: signif twitch, deep ache, release and elongation of tissue   DATE:  09/20/22: Recumbent bike L3 x 4' PT present to discuss plan for session Chair  plank with mountain climber slow march alt LE x20 Chair plank with hip ext x10 each LE Supine dying bug x20, 90/90 heel tappers x 10, LE scissors x 10, bicycle x 20 Supine LTR 2x5" bil Fig 4 bridge x 10 each LE Bird dog to knee tap x 3 rounds hold 5 sec  15lb kbell standing deadlift x 12 - much improved technique today without cueing Standing T 10lb single UE support x10 each  Trigger Point Dry-Needling  Treatment instructions: Expect mild to moderate muscle soreness. S/S of pneumothorax if dry needled over a lung field, and to seek immediate medical attention should they occur. Patient verbalized understanding of these instructions and education.  Patient Consent Given: Yes Education handout provided: Previously provided Muscles treated: bil med and lateral hamstrings Electrical stimulation performed: No Parameters: N/A Treatment response/outcome: signif twitch, deep ache, release and elongation of tissue   PATIENT EDUCATION:  Education details: anatomy of spine to go over imaging results of lumbar spine and pelvis/hips Person educated: Patient Education method: Explanation Education comprehension: verbalized understanding  HOME EXERCISE PROGRAM: Access Code: [email or web address] URL: [email or web address] Date: 09/20/2022 Prepared by: Loistine Simas Beuhring  Exercises - Standing Quadriceps Stretch  - 1 x daily - 7 x weekly - 1 sets - 2-3 reps - 20-30 hold - Standing Hamstring Stretch on Chair  - 1 x daily - 7 x weekly - 1 sets - 2-3 reps - 20-30 hold - Standing Bilateral Gastroc Stretch with Step  - 1 x daily - 7 x weekly - 1 sets - 2-3 reps - 20-30 hold - Cat Cow  - 1 x daily - 7 x weekly - 1 sets - 10 reps -  Supine Piriformis Stretch with Foot on Ground  - 1 x daily - 7 x weekly - 1 sets - 2-3 reps - 20-30 hold - Supine ITB Stretch with Strap  - 1 x daily - 7 x weekly - 1 sets - 2-3 reps - 20-30 hold - Single-Leg [place] Deadlift With Kettlebell  - 1 x daily - 7 x weekly - 1  sets - 10 reps - Bird Dog with Knee Taps  - 1 x daily - 7 x weekly - 1 sets - 5 reps - 5 hold - Standard Plank  - 1 x daily - 7 x weekly - 1 sets - 3 reps - 30 hold - Supine 90/90 Alternating Toe Touch  - 1 x daily - 7 x weekly - 1 sets - 10 reps - Supine Dead Bug with Leg Extension  - 1 x daily - 7 x weekly - 1 sets - 10 reps - Supine Leg Scissors  - 1 x daily - 7 x weekly - 1 sets - 10 reps - Supine Bicycles  - 1 x daily - 7 x weekly - 1 sets - 10 reps - Figure 4 Bridge  - 1 x daily - 7 x weekly - 1 sets - 10 reps - Mountain Climbers Slow  - 1 x daily - 7 x weekly - 1 sets - 10 reps - Plank with Hip Extension  - 1 x daily - 7 x weekly - 1 sets - 10 reps - Standing Deadlift with Barbell - Knees Straight  - 1 x daily - 7 x weekly - 1 sets - 10 reps  ASSESSMENT:  CLINICAL IMPRESSION: Pt with increased LBP due to air travel last week.  Session focused on flexibility and core strength.  Pt with good response to DN to lumbar multifidi and gluteals with twitch and improved tissue mobility after.  PT monitored throughout session and provided verbal and tactile cues for alignment and form. Patient will benefit from skilled PT to address the below impairments and improve overall function.   OBJECTIVE IMPAIRMENTS: decreased mobility, decreased ROM, decreased strength, hypomobility, increased muscle spasms, impaired flexibility, improper body mechanics, postural dysfunction, and pain.   ACTIVITY LIMITATIONS: sitting, standing, and sleeping  PARTICIPATION LIMITATIONS: community activity and occupation  PERSONAL FACTORS: Time since onset of injury/illness/exacerbation are also affecting patient's functional outcome.   REHAB POTENTIAL: Excellent  CLINICAL DECISION MAKING: Stable/uncomplicated  EVALUATION COMPLEXITY: Low   GOALS: Goals reviewed with patient? Yes  SHORT TERM GOALS: Target date: 09/18/22  Pt will be ind with initial HEP Baseline: Goal status: ongoing  2.  Pt will achieve  hamstring length of at least 65 deg bil Baseline: 50 deg bil Goal status: ongoing  3.  Pt will demo proper postural alignment in sitting and standing within sessions with intermittent cueing for correction needed. Baseline: few to no cues required  Goal status: MET  4.  Pt will report reduction in pain with sitting for work by 25% Baseline:  Goal status: INITIAL    LONG TERM GOALS: Target date: 10/16/22  Pt will be ind with advanced HEP Baseline:  Goal status: INITIAL  2.  Pt will demo full trunk ROM without pain for improved functional task performance. Baseline:  Goal status: INITIAL  3.  Pt will be able to sit for up to 2 hours for work without exacerbation of pain and LE symptoms. Baseline: sitting on cushion so can sit a little bit longer (09/24/22) Goal status: MET  4.  Pt  will demo proper stabilization strategies within dynamic therex without need for postural cueing Baseline: fewer cues required (10/03/22) Goal status: IN PROGRESS  5.  Pt will achieve LE flexibility to WNL bil to reduce undue strain on trunk, pelvis and hips. Baseline: still reduced ROM/flexibility although improving (10/03/22) Goal status: IN PROGRESS  6.  Pt will be able to sleep without pain interruption  Baseline: sleeping without pain (09/24/22) Goal status: MET  PLAN:  PT FREQUENCY: 2x/week  PT DURATION: 8 weeks  PLANNED INTERVENTIONS: Therapeutic exercises, Therapeutic activity, Neuromuscular re-education, Patient/Family education, Self Care, Joint mobilization, Dry Needling, Electrical stimulation, Spinal mobilization, Cryotherapy, Moist heat, Taping, Ionotophoresis 4mg /ml Dexamethasone, and Manual therapy.  PLAN FOR NEXT SESSION: Continue core strength progression, DN as needed.  FOTO next  [employer], PT 10/03/22 9:28 AM

## 2022-10-08 ENCOUNTER — Ambulatory Visit: Payer: 59 | Admitting: Physical Therapy

## 2022-10-11 ENCOUNTER — Ambulatory Visit: Payer: 59 | Attending: Sports Medicine | Admitting: Physical Therapy

## 2022-10-11 ENCOUNTER — Encounter: Payer: Self-pay | Admitting: Physical Therapy

## 2022-10-11 DIAGNOSIS — M545 Low back pain, unspecified: Secondary | ICD-10-CM | POA: Diagnosis present

## 2022-10-11 DIAGNOSIS — M25551 Pain in right hip: Secondary | ICD-10-CM

## 2022-10-11 DIAGNOSIS — M6281 Muscle weakness (generalized): Secondary | ICD-10-CM | POA: Diagnosis present

## 2022-10-11 DIAGNOSIS — M25552 Pain in left hip: Secondary | ICD-10-CM | POA: Insufficient documentation

## 2022-10-11 DIAGNOSIS — M25522 Pain in left elbow: Secondary | ICD-10-CM | POA: Diagnosis present

## 2022-10-11 NOTE — Therapy (Signed)
OUTPATIENT PHYSICAL THERAPY TREATMENT   Patient Name: Sean Cochran MRN: [id number] DOB:1960/07/03, 62 y.o., male Today's Date: 10/11/2022  END OF SESSION:  PT End of Session - 10/11/22 0846     Visit Number 9    Date for PT Re-Evaluation 10/16/22    Authorization Type [employer]    PT Start Time [phone]    PT Stop Time 0930    PT Time Calculation (min) 44 min    Activity Tolerance Patient tolerated treatment well    Behavior During Therapy [facility] for tasks assessed/performed                     Past Medical History:  Diagnosis Date   Abnormal finding on MRI of brain 10/04/2014   Back pain    Colon polyp 02/19/2013   Dr. Loreta Ave - Repeat in 5 years   Constipation    Hip pain    History of pineal cyst    Hyperlipidemia    Memory loss    Past Surgical History:  Procedure Laterality Date   BICEPS TENDON REPAIR Left    COLONOSCOPY     ROTATOR CUFF REPAIR Right    TENNIS ELBOW RELEASE/NIRSCHEL PROCEDURE Left 08/31/2021   Procedure: TENNIS ELBOW RELEASE/NIRSCHEL PROCEDURE;  Surgeon: Teryl Lucy, MD;  Location: [facility];  Service: Orthopedics;  Laterality: Left;   TONSILLECTOMY     VASECTOMY     Patient Active Problem List   Diagnosis Date Noted   Allergic rhinitis 06/20/2020   Nonalcoholic steatohepatitis (NASH) 06/17/2018   Eczema 12/31/2016   Routine general medical examination at a health care facility 06/26/2016   Abnormal finding on MRI of brain 10/04/2014   Hyperlipidemia LDL goal <160 05/08/2013    PCP: Etta Grandchild MD  REFERRING PROVIDER: Ralene Cork, DO  REFERRING DIAG: [phone] (ICD-10-CM) - Bilateral hip pain M54.50 (ICD-10-CM) - Bilateral low back pain without sciatica, unspecified chronicity  Rationale for Evaluation and Treatment: Rehabilitation  THERAPY DIAG:  Bilateral hip pain  Bilateral low back pain without sciatica, unspecified chronicity  Pain in left elbow  Muscle weakness (generalized)  ONSET DATE:  chronic  SUBJECTIVE:                                                                                                                                                                                           SUBJECTIVE STATEMENT: I traveled again last Fri and did my HEP when I got home after driving and felt sharp pain in my back with the deadlift so haven't done them since.  It is now back to stiffness.  I also  had to powerwash for 3 hours on Sunday. My LE pain has improved a lot - very rare to get radiating pain.  PT has helped that a lot.  Numbness and pain in saddle are is constant but lower grade. Overall 60% improvement.   Eval: Pt has chronic LBP and bil hip pain.  "I've always had LBP."  Pain is in Lt/Rt low back "in the dents" and in the hips behind the hip bones.  About 2 years ago I started having numbness in scrotum and did some ortho PT with some improvement but not really getting better.  If I sit on a stationary bike my groin will go numb in 15 min. My legs can go numb, Lt>Rt, typically in hamstring but can extend into back of calf. I usually do yoga on mat on floor - if I lay flat on back my legs can start going numb.  PERTINENT HISTORY:  Plays tennis   PAIN:  PAIN:  Are you having pain? Yes NPRS scale: 3/10 Pain location: bil gluteals and lumbar multifidi Pain orientation: Bilateral  PAIN TYPE: aching, sharp, and tight Pain description: constant  Aggravating factors: sitting, driving Relieving factors: movement, maybe yoga   PRECAUTIONS: None  WEIGHT BEARING RESTRICTIONS: No  FALLS:  Has patient fallen in last 6 months? No  LIVING ENVIRONMENT: Lives with: lives with their spouse Lives in: House/apartment   OCCUPATION: works for [employer] - sitting or driving  PLOF: Independent  PATIENT GOALS: improve symptoms  NEXT MD VISIT: as needed  OBJECTIVE:   DIAGNOSTIC FINDINGS:  IMPRESSION: 1. Facet arthropathy most advanced at L4-L5, with a small  effusion on the left and periarticular edema and enhancement on the right. This may reflect a source of pain. 2. Diffuse disc bulge at L4-L5 resulting in narrowing of the subarticular zones without evidence of frank nerve root impingement, and mild bilateral neural foraminal stenosis with possible irritation of the exiting right L4 nerve root by extraforaminal disc material. 3. Diffuse disc bulge at L5-S1 which contacts the traversing right S1 nerve root. 4. No other significant spinal canal or neural foraminal stenosis, and no other evidence of nerve root impingement.  Small volume of fluid in the trochanteric bursa bilaterally suggestive of bursitis.   Mild left hip degenerative change  PATIENT SURVEYS:  8/8: FOTO 82% met goal FOTO 56, goal 68%  SCREENING FOR RED FLAGS: Bowel or bladder incontinence: No Spinal tumors: No Cauda equina syndrome: No Compression fracture: No Abdominal aneurysm: No  COGNITION: Overall cognitive status: Within functional limits for tasks assessed     SENSATION: Pt reports numbness Lt>Rt posterior thigh sometimes into calf, comes and goes, scrotum will go numb with sitting >15' depending on seat  MUSCLE LENGTH: Hamstrings: Right 50 deg; Left 50 deg End range tightness Lt quad and bil ITB Increased tone of bil hamstrings and lateral gastroc  POSTURE:  sway back, hips shunted forward in sockets, overuse of gluteals - able to correct with VC  PALPATION: Increased tone of bil hamstrings and lateral gastroc Tender along bil lumbar mutlifidi TP present in bil gluteals, piriformis, lateral hamstring and lateral gastroc  LUMBAR ROM:   AROM eval  Flexion Full with LBP  Extension Full with LBP  Right lateral flexion Full Rt sided pain  Left lateral flexion Full Rt sided pain  Right rotation 50%  Left rotation 50%   (Blank rows = not tested)  LOWER EXTREMITY ROM:    Hips and knees WNL    LOWER  EXTREMITY MMT:   5/5 bil LE   LUMBAR  SPECIAL TESTS:  Straight leg raise test: Negative  FUNCTIONAL TESTS:  Able to perform SLS, able to deep squat and return to stand  GAIT: Distance walked: within clinic Assistive device utilized: None Level of assistance: Complete Independence Comments: stands and walks with sway back, pelvis anterior to trunk  TODAY'S TREATMENT:     10/11/22: Recumbent bike L3 x 7.5' PT present to discuss plan for session and do FOTO FOTO: 82%, met goal Standing HS stretch foot on chair bil 2x30" Cat/cow 5x5" Quadruped rocking 3 ways x5 reps each Quadruped tail wags - very stiff x10 Open book x10 with foam roll Supine dead bug isometric 3x10" with added ceilng reach crunch x 5 end of each hold Standing hip hinge unloaded x 10 (tight but loosened up), add 5lb kbell x 10 Supine LTR 2x5" bil Piriformis stretch in supine x30" bil Trigger Point Dry-Needling  Treatment instructions: Expect mild to moderate muscle soreness. S/S of pneumothorax if dry needled over a lung field, and to seek immediate medical attention should they occur. Patient verbalized understanding of these instructions and education.  Patient Consent Given: Yes Education handout provided: Previously provided Muscles treated: bil lumbar multifidi (marinating) and bil gluteals Electrical stimulation performed: No Parameters: N/A Treatment response/outcome: signif tension, twitch and release of tone PA and UPA lower thoracic and lumbar spine Gr II/III  10/03/22: Recumbent bike L5 x 5' PT present to discuss plan for session Open book x 10 with foam roll Supine dying bug x20, 90/90 heel tappers x 10, LE scissors x 10, bicycle x 20 Supine LTR 2x5" bil Fig 4 bridge stretch in supine 15lb kbell standing deadlift x 12 - much improved technique today without cueing Standing T 10lb single UE support x10 each  Trigger Point Dry-Needling  Treatment instructions: Expect mild to moderate muscle soreness. S/S of pneumothorax if dry needled over  a lung field, and to seek immediate medical attention should they occur. Patient verbalized understanding of these instructions and education.  Patient Consent Given: Yes Education handout provided: Previously provided Muscles treated: bil gluteals and lumbar spine  Elongation and release after DN Treatment response/outcome: signif twitch, deep ache, release and elongation of tissue 09/24/22: Recumbent bike L3 x 5' PT present to discuss plan for session Chair plank with mountain climber slow march alt LE x20 Chair plank with hip ext x10 each LE Supine dying bug x20, 90/90 heel tappers x 10, LE scissors x 10, bicycle x 20 Supine LTR 2x5" bil Fig 4 bridge x 10 each LE Bird dog to knee tap x 3 rounds hold 5 sec  15lb kbell standing deadlift x 12 - much improved technique today without cueing Standing T 10lb single UE support x10 each  Trigger Point Dry-Needling  Treatment instructions: Expect mild to moderate muscle soreness. S/S of pneumothorax if dry needled over a lung field, and to seek immediate medical attention should they occur. Patient verbalized understanding of these instructions and education.  Patient Consent Given: Yes Education handout provided: Previously provided Muscles treated: bil med and lateral hamstrings Electrical stimulation performed: No Parameters: N/A Treatment response/outcome: signif twitch, deep ache, release and elongation of tissue   DATE:  09/20/22: Recumbent bike L3 x 4' PT present to discuss plan for session Chair plank with mountain climber slow march alt LE x20 Chair plank with hip ext x10 each LE Supine dying bug x20, 90/90 heel tappers x 10, LE scissors x 10, bicycle x  20 Supine LTR 2x5" bil Fig 4 bridge x 10 each LE Bird dog to knee tap x 3 rounds hold 5 sec  15lb kbell standing deadlift x 12 - much improved technique today without cueing Standing T 10lb single UE support x10 each  Trigger Point Dry-Needling  Treatment instructions: Expect  mild to moderate muscle soreness. S/S of pneumothorax if dry needled over a lung field, and to seek immediate medical attention should they occur. Patient verbalized understanding of these instructions and education.  Patient Consent Given: Yes Education handout provided: Previously provided Muscles treated: bil med and lateral hamstrings Electrical stimulation performed: No Parameters: N/A Treatment response/outcome: signif twitch, deep ache, release and elongation of tissue   PATIENT EDUCATION:  Education details: anatomy of spine to go over imaging results of lumbar spine and pelvis/hips Person educated: Patient Education method: Explanation Education comprehension: verbalized understanding  HOME EXERCISE PROGRAM: Access Code: [email or web address] URL: [email or web address] Date: 09/20/2022 Prepared by: Loistine Simas   Exercises - Standing Quadriceps Stretch  - 1 x daily - 7 x weekly - 1 sets - 2-3 reps - 20-30 hold - Standing Hamstring Stretch on Chair  - 1 x daily - 7 x weekly - 1 sets - 2-3 reps - 20-30 hold - Standing Bilateral Gastroc Stretch with Step  - 1 x daily - 7 x weekly - 1 sets - 2-3 reps - 20-30 hold - Cat Cow  - 1 x daily - 7 x weekly - 1 sets - 10 reps - Supine Piriformis Stretch with Foot on Ground  - 1 x daily - 7 x weekly - 1 sets - 2-3 reps - 20-30 hold - Supine ITB Stretch with Strap  - 1 x daily - 7 x weekly - 1 sets - 2-3 reps - 20-30 hold - Single-Leg [place] Deadlift With Kettlebell  - 1 x daily - 7 x weekly - 1 sets - 10 reps - Bird Dog with Knee Taps  - 1 x daily - 7 x weekly - 1 sets - 5 reps - 5 hold - Standard Plank  - 1 x daily - 7 x weekly - 1 sets - 3 reps - 30 hold - Supine 90/90 Alternating Toe Touch  - 1 x daily - 7 x weekly - 1 sets - 10 reps - Supine Dead Bug with Leg Extension  - 1 x daily - 7 x weekly - 1 sets - 10 reps - Supine Leg Scissors  - 1 x daily - 7 x weekly - 1 sets - 10 reps - Supine Bicycles  - 1 x daily - 7 x weekly - 1  sets - 10 reps - Figure 4 Bridge  - 1 x daily - 7 x weekly - 1 sets - 10 reps - Mountain Climbers Slow  - 1 x daily - 7 x weekly - 1 sets - 10 reps - Plank with Hip Extension  - 1 x daily - 7 x weekly - 1 sets - 10 reps - Standing Deadlift with Barbell - Knees Straight  - 1 x daily - 7 x weekly - 1 sets - 10 reps  ASSESSMENT:  CLINICAL IMPRESSION: Pt traveled again last Fri and felt sharp lumbar pain after drives when doing deadlift.  Has been tight and sore since.  Discussed focusing on lumbar ROM and stretches on these days to restore mobility and flexibility and blood flow and to push strengthening to following day.  Pt greatly benefitting from PT with centralization of symptoms out of LE  into saddle region, hips and lumbar spine.  He reports 60% improvement to date.  Met FOTO goal at 82% today.  Will do ERO next visit with plan to extend PT given ongoing benefits and progress  OBJECTIVE IMPAIRMENTS: decreased mobility, decreased ROM, decreased strength, hypomobility, increased muscle spasms, impaired flexibility, improper body mechanics, postural dysfunction, and pain.   ACTIVITY LIMITATIONS: sitting, standing, and sleeping  PARTICIPATION LIMITATIONS: community activity and occupation  PERSONAL FACTORS: Time since onset of injury/illness/exacerbation are also affecting patient's functional outcome.   REHAB POTENTIAL: Excellent  CLINICAL DECISION MAKING: Stable/uncomplicated  EVALUATION COMPLEXITY: Low   GOALS: Goals reviewed with patient? Yes  SHORT TERM GOALS: Target date: 09/18/22  Pt will be ind with initial HEP Baseline: Goal status: ongoing  2.  Pt will achieve hamstring length of at least 65 deg bil Baseline: 50 deg bil Goal status: ongoing  3.  Pt will demo proper postural alignment in sitting and standing within sessions with intermittent cueing for correction needed. Baseline: few to no cues required  Goal status: MET  4.  Pt will report reduction in pain with  sitting for work by 25% Baseline:  Goal status: INITIAL    LONG TERM GOALS: Target date: 10/16/22  Pt will be ind with advanced HEP Baseline:  Goal status: INITIAL  2.  Pt will demo full trunk ROM without pain for improved functional task performance. Baseline:  Goal status: INITIAL  3.  Pt will be able to sit for up to 2 hours for work without exacerbation of pain and LE symptoms. Baseline: sitting on cushion so can sit a little bit longer (09/24/22) Goal status: MET  4.  Pt will demo proper stabilization strategies within dynamic therex without need for postural cueing Baseline: fewer cues required (10/03/22) Goal status: IN PROGRESS  5.  Pt will achieve LE flexibility to WNL bil to reduce undue strain on trunk, pelvis and hips. Baseline: still reduced ROM/flexibility although improving (10/03/22) Goal status: IN PROGRESS  6.  Pt will be able to sleep without pain interruption  Baseline: sleeping without pain (09/24/22) Goal status: MET  PLAN:  PT FREQUENCY: 2x/week  PT DURATION: 8 weeks  PLANNED INTERVENTIONS: Therapeutic exercises, Therapeutic activity, Neuromuscular re-education, Patient/Family education, Self Care, Joint mobilization, Dry Needling, Electrical stimulation, Spinal mobilization, Cryotherapy, Moist heat, Taping, Ionotophoresis 4mg /ml Dexamethasone, and Manual therapy.  PLAN FOR NEXT SESSION: Continue core strength progression, DN as needed.  ERO next  [employer], PT 10/11/22 9:34 AM

## 2022-10-16 ENCOUNTER — Ambulatory Visit: Payer: 59 | Admitting: Physical Therapy

## 2022-10-30 ENCOUNTER — Ambulatory Visit: Payer: 59 | Admitting: Physical Therapy

## 2022-11-12 ENCOUNTER — Ambulatory Visit: Payer: 59 | Admitting: Physical Therapy

## 2022-11-27 ENCOUNTER — Other Ambulatory Visit (HOSPITAL_COMMUNITY): Payer: Self-pay

## 2022-11-27 ENCOUNTER — Other Ambulatory Visit: Payer: Self-pay

## 2022-11-27 ENCOUNTER — Other Ambulatory Visit (HOSPITAL_BASED_OUTPATIENT_CLINIC_OR_DEPARTMENT_OTHER): Payer: Self-pay

## 2022-11-29 ENCOUNTER — Encounter: Payer: Self-pay | Admitting: Physical Therapy

## 2022-11-29 ENCOUNTER — Ambulatory Visit: Payer: 59 | Attending: Sports Medicine | Admitting: Physical Therapy

## 2022-11-29 DIAGNOSIS — M25552 Pain in left hip: Secondary | ICD-10-CM | POA: Diagnosis present

## 2022-11-29 DIAGNOSIS — M25551 Pain in right hip: Secondary | ICD-10-CM | POA: Diagnosis present

## 2022-11-29 DIAGNOSIS — M545 Low back pain, unspecified: Secondary | ICD-10-CM | POA: Diagnosis present

## 2022-11-29 NOTE — Therapy (Signed)
OUTPATIENT PHYSICAL THERAPY TREATMENT   Patient Name: Sean Cochran MRN: [id number] DOB:[birth year], 62 y.o., male Today's Date: 11/29/2022  END OF SESSION:  PT End of Session - 11/29/22 0847     Visit Number 10    Date for PT Re-Evaluation 01/24/23    Authorization Type [employer]    PT Start Time 0846    PT Stop Time 0925    PT Time Calculation (min) 39 min    Activity Tolerance Patient tolerated treatment well    Behavior During Therapy [facility] for tasks assessed/performed                      Past Medical History:  Diagnosis Date   Abnormal finding on MRI of brain 10/04/2014   Back pain    Colon polyp 02/19/2013   Dr. Loreta Ave - Repeat in 5 years   Constipation    Hip pain    History of pineal cyst    Hyperlipidemia    Memory loss    Past Surgical History:  Procedure Laterality Date   BICEPS TENDON REPAIR Left    COLONOSCOPY     ROTATOR CUFF REPAIR Right    TENNIS ELBOW RELEASE/NIRSCHEL PROCEDURE Left 08/31/2021   Procedure: TENNIS ELBOW RELEASE/NIRSCHEL PROCEDURE;  Surgeon: Teryl Lucy, MD;  Location: [facility];  Service: Orthopedics;  Laterality: Left;   TONSILLECTOMY     VASECTOMY     Patient Active Problem List   Diagnosis Date Noted   Allergic rhinitis 06/20/2020   Nonalcoholic steatohepatitis (NASH) 06/17/2018   Eczema 12/31/2016   Routine general medical examination at a health care facility 06/26/2016   Abnormal finding on MRI of brain 10/04/2014   Hyperlipidemia LDL goal <160 05/08/2013    PCP: Etta Grandchild MD  REFERRING PROVIDER: Ralene Cork, DO  REFERRING DIAG: [id number] (ICD-10-CM) - Bilateral hip pain M54.50 (ICD-10-CM) - Bilateral low back pain without sciatica, unspecified chronicity  Rationale for Evaluation and Treatment: Rehabilitation  THERAPY DIAG:  Bilateral hip pain  Bilateral low back pain without sciatica, unspecified chronicity  ONSET DATE: chronic  SUBJECTIVE:                                                                                                                                                                                            SUBJECTIVE STATEMENT: Pt care has lapsed for 1.5 mos due to work and travel.  In Aug I aggravated the saddle region burning and numbness and had to start icing again.  Still going on.  Lt groin burning and Lt testicle is numb.  Numbness also extends consistently into  Lt buttock, back of thigh, outside of calf and into top of foot sometimes.  I traveled in [place] for 2 weeks and did well walking a lot and was surprised how well I did with that.  We had to drive 5 hours after flight back and now I can aggravate the Lt leg nerve all the way down into top of my foot with some of the stretches.     Eval: Pt has chronic LBP and bil hip pain.  "I've always had LBP."  Pain is in Lt/Rt low back "in the dents" and in the hips behind the hip bones.  About 2 years ago I started having numbness in scrotum and did some ortho PT with some improvement but not really getting better.  If I sit on a stationary bike my groin will go numb in 15 min. My legs can go numb, Lt>Rt, typically in hamstring but can extend into back of calf. I usually do yoga on mat on floor - if I lay flat on back my legs can start going numb.  PERTINENT HISTORY:  Plays tennis   PAIN:  PAIN:  Are you having pain? Yes NPRS scale: 3-8/10 Pain location: Lt SITS bone, buttock, posterior thigh, lateral leg, top of foot Pain orientation: Lt >> Rt PAIN TYPE: numb, tight, radiating Pain description: constant  Aggravating factors: sitting, driving, stretching Relieving factors: avoiding sitting too long   PRECAUTIONS: None  WEIGHT BEARING RESTRICTIONS: No  FALLS:  Has patient fallen in last 6 months? No  LIVING ENVIRONMENT: Lives with: lives with their spouse Lives in: House/apartment   OCCUPATION: works for [employer] - sitting or driving  PLOF: Independent  PATIENT  GOALS: improve symptoms  NEXT MD VISIT: as needed  OBJECTIVE:   DIAGNOSTIC FINDINGS:  IMPRESSION: 1. Facet arthropathy most advanced at L4-L5, with a small effusion on the left and periarticular edema and enhancement on the right. This may reflect a source of pain. 2. Diffuse disc bulge at L4-L5 resulting in narrowing of the subarticular zones without evidence of frank nerve root impingement, and mild bilateral neural foraminal stenosis with possible irritation of the exiting right L4 nerve root by extraforaminal disc material. 3. Diffuse disc bulge at L5-S1 which contacts the traversing right S1 nerve root. 4. No other significant spinal canal or neural foraminal stenosis, and no other evidence of nerve root impingement.  Small volume of fluid in the trochanteric bursa bilaterally suggestive of bursitis.   Mild left hip degenerative change  PATIENT SURVEYS:  8/8: FOTO 82% met goal FOTO 56, goal 68%  SCREENING FOR RED FLAGS: Bowel or bladder incontinence: No Spinal tumors: No Cauda equina syndrome: No Compression fracture: No Abdominal aneurysm: No  COGNITION: Overall cognitive status: Within functional limits for tasks assessed     SENSATION: 9/26: Pt reports numbness Lt buttock, lateral hip, lateral calf, top of foot, Lt testicle Eval: Pt reports numbness Lt>Rt posterior thigh sometimes into calf, comes and goes, scrotum will go numb with sitting >15' depending on seat  MUSCLE LENGTH: 9/26: Rt HS 60 deg, Lt HS 50 deg Lt quad end range tight by 10 deg compared to full on Rt  Eval:  Hamstrings: Right 50 deg; Left 50 deg End range tightness Lt quad and bil ITB Increased tone of bil hamstrings and lateral gastroc  POSTURE:  9/26:  Eval: sway back, hips shunted forward in sockets, overuse of gluteals - able to correct with VC  PALPATION: Increased tone of bil hamstrings and lateral  gastroc Tender along bil lumbar mutlifidi TP present in bil gluteals,  piriformis, lateral hamstring and lateral gastroc  LUMBAR ROM:   AROM eval 11/29/22  Flexion Full with LBP Fingers to ankles no pain just tight HS and lumbar  Extension Full with LBP Full no pain  Right lateral flexion Full Rt sided pain Full Rt sided pain  Left lateral flexion Full Rt sided pain Full no pain  Right rotation 50% full  Left rotation 50% full   (Blank rows = not tested)  LOWER EXTREMITY ROM:    Hips and knees WNL    LOWER EXTREMITY MMT:    5/5 bil LE   LUMBAR SPECIAL TESTS:  Straight leg raise test: Negative  FUNCTIONAL TESTS:  Able to perform SLS, able to deep squat and return to stand  GAIT: Distance walked: within clinic Assistive device utilized: None Level of assistance: Complete Independence Comments: stands and walks with sway back, pelvis anterior to trunk  TODAY'S TREATMENT:     11/29/22: Objective reassessment Long discussion of possible causes of saddle region symptoms, recommend return to urologist for work up and plan - possible pelvic PT if pudendal neuralgia is suspected Long discussion of plan for sciatic symptoms, continue PT and return to referring MD given progression of LE numbness and pain in L5 distribution   10/11/22: Recumbent bike L3 x 7.5' PT present to discuss plan for session and do FOTO FOTO: 82%, met goal Standing HS stretch foot on chair bil 2x30" Cat/cow 5x5" Quadruped rocking 3 ways x5 reps each Quadruped tail wags - very stiff x10 Open book x10 with foam roll Supine dead bug isometric 3x10" with added ceilng reach crunch x 5 end of each hold Standing hip hinge unloaded x 10 (tight but loosened up), add 5lb kbell x 10 Supine LTR 2x5" bil Piriformis stretch in supine x30" bil Trigger Point Dry-Needling  Treatment instructions: Expect mild to moderate muscle soreness. S/S of pneumothorax if dry needled over a lung field, and to seek immediate medical attention should they occur. Patient verbalized understanding of these  instructions and education.  Patient Consent Given: Yes Education handout provided: Previously provided Muscles treated: bil lumbar multifidi (marinating) and bil gluteals Electrical stimulation performed: No Parameters: N/A Treatment response/outcome: signif tension, twitch and release of tone PA and UPA lower thoracic and lumbar spine Gr II/III  10/03/22: Recumbent bike L5 x 5' PT present to discuss plan for session Open book x 10 with foam roll Supine dying bug x20, 90/90 heel tappers x 10, LE scissors x 10, bicycle x 20 Supine LTR 2x5" bil Fig 4 bridge stretch in supine 15lb kbell standing deadlift x 12 - much improved technique today without cueing Standing T 10lb single UE support x10 each  Trigger Point Dry-Needling  Treatment instructions: Expect mild to moderate muscle soreness. S/S of pneumothorax if dry needled over a lung field, and to seek immediate medical attention should they occur. Patient verbalized understanding of these instructions and education.  Patient Consent Given: Yes Education handout provided: Previously provided Muscles treated: bil gluteals and lumbar spine  Elongation and release after DN Treatment response/outcome: signif twitch, deep ache, release and elongation of tissue 09/24/22: Recumbent bike L3 x 5' PT present to discuss plan for session Chair plank with mountain climber slow march alt LE x20 Chair plank with hip ext x10 each LE Supine dying bug x20, 90/90 heel tappers x 10, LE scissors x 10, bicycle x 20 Supine LTR 2x5" bil Fig 4 bridge x  10 each LE Bird dog to knee tap x 3 rounds hold 5 sec  15lb kbell standing deadlift x 12 - much improved technique today without cueing Standing T 10lb single UE support x10 each  Trigger Point Dry-Needling  Treatment instructions: Expect mild to moderate muscle soreness. S/S of pneumothorax if dry needled over a lung field, and to seek immediate medical attention should they occur. Patient verbalized  understanding of these instructions and education.  Patient Consent Given: Yes Education handout provided: Previously provided Muscles treated: bil med and lateral hamstrings Electrical stimulation performed: No Parameters: N/A Treatment response/outcome: signif twitch, deep ache, release and elongation of tissue   DATE:  09/20/22: Recumbent bike L3 x 4' PT present to discuss plan for session Chair plank with mountain climber slow march alt LE x20 Chair plank with hip ext x10 each LE Supine dying bug x20, 90/90 heel tappers x 10, LE scissors x 10, bicycle x 20 Supine LTR 2x5" bil Fig 4 bridge x 10 each LE Bird dog to knee tap x 3 rounds hold 5 sec  15lb kbell standing deadlift x 12 - much improved technique today without cueing Standing T 10lb single UE support x10 each  Trigger Point Dry-Needling  Treatment instructions: Expect mild to moderate muscle soreness. S/S of pneumothorax if dry needled over a lung field, and to seek immediate medical attention should they occur. Patient verbalized understanding of these instructions and education.  Patient Consent Given: Yes Education handout provided: Previously provided Muscles treated: bil med and lateral hamstrings Electrical stimulation performed: No Parameters: N/A Treatment response/outcome: signif twitch, deep ache, release and elongation of tissue   PATIENT EDUCATION:  Education details: anatomy of spine to go over imaging results of lumbar spine and pelvis/hips Person educated: Patient Education method: Explanation Education comprehension: verbalized understanding  HOME EXERCISE PROGRAM: Access Code: [email or web address] URL: [email or web address] Date: 09/20/2022 Prepared by: Loistine Simas Takyah Ciaramitaro  Exercises - Standing Quadriceps Stretch  - 1 x daily - 7 x weekly - 1 sets - 2-3 reps - 20-30 hold - Standing Hamstring Stretch on Chair  - 1 x daily - 7 x weekly - 1 sets - 2-3 reps - 20-30 hold - Standing Bilateral Gastroc  Stretch with Step  - 1 x daily - 7 x weekly - 1 sets - 2-3 reps - 20-30 hold - Cat Cow  - 1 x daily - 7 x weekly - 1 sets - 10 reps - Supine Piriformis Stretch with Foot on Ground  - 1 x daily - 7 x weekly - 1 sets - 2-3 reps - 20-30 hold - Supine ITB Stretch with Strap  - 1 x daily - 7 x weekly - 1 sets - 2-3 reps - 20-30 hold - Single-Leg [place] Deadlift With Kettlebell  - 1 x daily - 7 x weekly - 1 sets - 10 reps - Bird Dog with Knee Taps  - 1 x daily - 7 x weekly - 1 sets - 5 reps - 5 hold - Standard Plank  - 1 x daily - 7 x weekly - 1 sets - 3 reps - 30 hold - Supine 90/90 Alternating Toe Touch  - 1 x daily - 7 x weekly - 1 sets - 10 reps - Supine Dead Bug with Leg Extension  - 1 x daily - 7 x weekly - 1 sets - 10 reps - Supine Leg Scissors  - 1 x daily - 7 x weekly - 1 sets - 10 reps - Supine Bicycles  -  1 x daily - 7 x weekly - 1 sets - 10 reps - Figure 4 Bridge  - 1 x daily - 7 x weekly - 1 sets - 10 reps - Mountain Climbers Slow  - 1 x daily - 7 x weekly - 1 sets - 10 reps - Plank with Hip Extension  - 1 x daily - 7 x weekly - 1 sets - 10 reps - Standing Deadlift with Barbell - Knees Straight  - 1 x daily - 7 x weekly - 1 sets - 10 reps  ASSESSMENT:  CLINICAL IMPRESSION: Pt with lapse in care for 1.5 mos due to work schedule and international travels.  Pt had been on a good track with PT for symptom reduction and management within tolerable levels to maintain daily activities; however, he has since regressed without PT and with traveling.  He has two areas of symptoms: Lt groin/saddle region numbness, pain and burning for which I have recommended he return to urologist as they had discussed possible nerve cause previously and this symptom has worsened.  We discussed the possibility of pudendal neuralgia but again, his MD needs to work this up and develop a plan of care for this. He may benefit from pelvic floor PT if this is suspected.  Second symptom is worsening of Lt LE numbness  which is fairly constant more proximally at gluteals, SITS bone, lateral thigh, lateral calf and sometimes extending into top of foot along L5 distribution.  The nature of his symptoms have changed and become more concentrated on Lt.  His MRI is from Feb 2023.  I have recommended a follow up with referring MD and plan an  extension of PT in the meantime.  Pt has full LE strength despite the numbness.  He has very tight HS Lt>>Rt.  SLR is negative bil but there is significant neural tension present.  He has previously benefited from DN so we will include that in POC again.  PT recommends 1-2x/week x 8 more weeks alongside Pt follow up with MD above as mentioned.   OBJECTIVE IMPAIRMENTS: decreased mobility, decreased ROM, decreased strength, hypomobility, increased muscle spasms, impaired flexibility, improper body mechanics, postural dysfunction, and pain.   ACTIVITY LIMITATIONS: sitting, standing, and sleeping  PARTICIPATION LIMITATIONS: community activity and occupation  PERSONAL FACTORS: Time since onset of injury/illness/exacerbation are also affecting patient's functional outcome.   REHAB POTENTIAL: Excellent  CLINICAL DECISION MAKING: Stable/uncomplicated  EVALUATION COMPLEXITY: Low   GOALS: Goals reviewed with patient? Yes  SHORT TERM GOALS: Target date: 09/18/22  Pt will be ind with initial HEP Baseline: Goal status: ongoing  2.  Pt will achieve hamstring length of at least 65 deg bil Baseline: 50 deg bil Goal status: ongoing  3.  Pt will demo proper postural alignment in sitting and standing within sessions with intermittent cueing for correction needed. Baseline: few to no cues required  Goal status: MET  4.  Pt will report reduction in pain with sitting for work by 25% Baseline:  Goal status: INITIAL    LONG TERM GOALS: Target date: 10/16/22  Pt will be ind with advanced HEP Baseline:  Goal status: INITIAL  2.  Pt will demo full trunk ROM without pain for  improved functional task performance. Baseline:  Goal status: INITIAL  3.  Pt will be able to sit for up to 2 hours for work without exacerbation of pain and LE symptoms. Baseline: sitting on cushion so can sit a little bit longer (09/24/22) Goal status: MET  4.  Pt will demo proper stabilization strategies within dynamic therex without need for postural cueing Baseline: fewer cues required (10/03/22) Goal status: IN PROGRESS  5.  Pt will achieve LE flexibility to WNL bil to reduce undue strain on trunk, pelvis and hips. Baseline: still reduced ROM/flexibility although improving (10/03/22) Goal status: IN PROGRESS  6.  Pt will be able to sleep without pain interruption  Baseline: sleeping without pain (09/24/22) Goal status: PREVIOUSLY MET BUT REGRESSED 9/26  PLAN:  PT FREQUENCY: 2x/week  PT DURATION: 8 weeks  PLANNED INTERVENTIONS: Therapeutic exercises, Therapeutic activity, Neuromuscular re-education, Patient/Family education, Self Care, Joint mobilization, Dry Needling, Electrical stimulation, Spinal mobilization, Cryotherapy, Moist heat, Taping, Ionotophoresis 4mg /ml Dexamethasone, and Manual therapy.  PLAN FOR NEXT SESSION: Lt sciatic flossing/tensioners; DN HS, lumbar, hip; foam roller release bil hips, lumbopelvic stabilization, postural re-ed  Morton Peters, PT 11/29/22 10:36 AM

## 2022-12-18 ENCOUNTER — Ambulatory Visit: Payer: 59

## 2022-12-20 ENCOUNTER — Ambulatory Visit: Payer: 59 | Attending: Sports Medicine | Admitting: Physical Therapy

## 2022-12-20 ENCOUNTER — Encounter: Payer: Self-pay | Admitting: Physical Therapy

## 2022-12-20 DIAGNOSIS — M25552 Pain in left hip: Secondary | ICD-10-CM | POA: Diagnosis present

## 2022-12-20 DIAGNOSIS — M25551 Pain in right hip: Secondary | ICD-10-CM | POA: Insufficient documentation

## 2022-12-20 DIAGNOSIS — M545 Low back pain, unspecified: Secondary | ICD-10-CM | POA: Diagnosis present

## 2022-12-27 ENCOUNTER — Encounter: Payer: Self-pay | Admitting: Physical Therapy

## 2022-12-31 ENCOUNTER — Encounter: Payer: 59 | Admitting: Physical Therapy

## 2023-01-03 ENCOUNTER — Ambulatory Visit: Payer: 59 | Admitting: Physical Therapy

## 2023-01-07 ENCOUNTER — Ambulatory Visit: Payer: 59

## 2023-01-08 ENCOUNTER — Other Ambulatory Visit (HOSPITAL_BASED_OUTPATIENT_CLINIC_OR_DEPARTMENT_OTHER): Payer: Self-pay

## 2023-01-08 MED ORDER — FLULAVAL 0.5 ML IM SUSY
PREFILLED_SYRINGE | INTRAMUSCULAR | 0 refills | Status: DC
  Filled 2023-01-08: qty 0.5, 1d supply, fill #0

## 2023-01-10 ENCOUNTER — Encounter: Payer: 59 | Admitting: Physical Therapy

## 2023-01-23 ENCOUNTER — Encounter: Payer: Self-pay | Admitting: Sports Medicine

## 2023-01-23 ENCOUNTER — Ambulatory Visit (INDEPENDENT_AMBULATORY_CARE_PROVIDER_SITE_OTHER): Payer: 59 | Admitting: Sports Medicine

## 2023-01-23 VITALS — BP 100/70 | Ht 71.0 in | Wt 178.0 lb

## 2023-01-23 DIAGNOSIS — S86811A Strain of other muscle(s) and tendon(s) at lower leg level, right leg, initial encounter: Secondary | ICD-10-CM | POA: Diagnosis not present

## 2023-01-23 NOTE — Progress Notes (Signed)
   Subjective:    Patient ID: TAIKI ANGELINA, male    DOB: [birth year], 61 y.o.   MRN: [id number]  HPI chief complaint: Right calf pain  Trey Paula presents today after having suffered a right calf injury approximately 3 weeks ago while playing tennis.  He felt a sudden onset of discomfort in the distal calf when charging the net.  He denies any swelling or bruising.  After resting for a few days his pain improved to the point that he returned to the tennis courts only to reinjure himself.  He localizes pain to the distal medial calf at the musculotendinous junction.  Other than rest, he has not had any other treatment.   Review of Systems As above    Objective:   Physical Exam  Well-developed, well-nourished.  No acute distress  Right calf: There is some tenderness to palpation along the distal medial calf at the musculotendinous junction.  No palpable defect.  No swelling.  No ecchymosis.  Good strength and good pulses distally.  Limited MSK ultrasound does show a small hypoechoic area at the interface of the medial gastroc and soleus.  Sono palpation reproduces pain at this area.      Assessment & Plan:   Right calf strain  Compression sleeve when active.  Trey Paula already has heel lifts and I recommended that he wear them in his tennis shoes consistently until his pain resolves.  He will also begin some eccentric heel drop exercises, doing them daily.  I recommended that he try all of this for about 3 weeks before returning to the tennis courts.  He will follow-up for ongoing or recalcitrant issues.  This note was dictated using Dragon naturally speaking software and may contain errors in syntax, spelling, or content which have not been identified prior to signing this note.

## 2023-01-29 LAB — PSA: PSA: 0.2

## 2023-02-05 ENCOUNTER — Other Ambulatory Visit (HOSPITAL_COMMUNITY): Payer: Self-pay

## 2023-02-05 MED ORDER — SILDENAFIL CITRATE 20 MG PO TABS
40.0000 mg | ORAL_TABLET | Freq: Every day | ORAL | 3 refills | Status: DC | PRN
  Filled 2023-02-05: qty 50, 10d supply, fill #0

## 2023-02-05 MED ORDER — FINASTERIDE 1 MG PO TABS
1.0000 mg | ORAL_TABLET | Freq: Every day | ORAL | 3 refills | Status: DC
  Filled 2023-02-05: qty 30, 30d supply, fill #0
  Filled 2023-03-12 – 2023-06-14 (×2): qty 90, 90d supply, fill #0
  Filled 2023-09-07: qty 90, 90d supply, fill #1
  Filled 2023-11-21: qty 90, 90d supply, fill #2

## 2023-02-08 ENCOUNTER — Other Ambulatory Visit (HOSPITAL_COMMUNITY): Payer: Self-pay

## 2023-03-12 ENCOUNTER — Other Ambulatory Visit: Payer: Self-pay

## 2023-03-12 ENCOUNTER — Other Ambulatory Visit (HOSPITAL_COMMUNITY): Payer: Self-pay

## 2023-06-14 ENCOUNTER — Other Ambulatory Visit (HOSPITAL_COMMUNITY): Payer: Self-pay

## 2023-06-14 ENCOUNTER — Other Ambulatory Visit (HOSPITAL_BASED_OUTPATIENT_CLINIC_OR_DEPARTMENT_OTHER): Payer: Self-pay

## 2023-09-07 ENCOUNTER — Other Ambulatory Visit (HOSPITAL_BASED_OUTPATIENT_CLINIC_OR_DEPARTMENT_OTHER): Payer: Self-pay

## 2023-11-06 ENCOUNTER — Telehealth: Payer: Self-pay

## 2023-11-06 NOTE — Telephone Encounter (Signed)
 Copied from CRM [id number]. Topic: Clinical - Lab/Test Results >> Nov 06, 2023  3:35 PM Mesmerise C wrote: Reason for CRM: Patient trying to have orders sent for blood work before his appointment for a NASH

## 2023-11-07 NOTE — Telephone Encounter (Signed)
 Patient has been made aware that Dr. Joshua hasn't seen him since 2023 and he will not place any orders until he is seen. Patient gave a verbal understanding.

## 2023-11-20 ENCOUNTER — Encounter: Payer: Self-pay | Admitting: Internal Medicine

## 2023-11-20 ENCOUNTER — Ambulatory Visit: Admitting: Internal Medicine

## 2023-11-20 VITALS — BP 128/80 | HR 71 | Temp 98.3°F | Resp 16 | Ht 71.0 in | Wt 181.8 lb

## 2023-11-20 DIAGNOSIS — Z23 Encounter for immunization: Secondary | ICD-10-CM

## 2023-11-20 DIAGNOSIS — Z0000 Encounter for general adult medical examination without abnormal findings: Secondary | ICD-10-CM | POA: Diagnosis not present

## 2023-11-20 DIAGNOSIS — K625 Hemorrhage of anus and rectum: Secondary | ICD-10-CM | POA: Insufficient documentation

## 2023-11-20 DIAGNOSIS — E785 Hyperlipidemia, unspecified: Secondary | ICD-10-CM | POA: Diagnosis not present

## 2023-11-20 DIAGNOSIS — Z83719 Family history of colon polyps, unspecified: Secondary | ICD-10-CM | POA: Insufficient documentation

## 2023-11-20 DIAGNOSIS — K7581 Nonalcoholic steatohepatitis (NASH): Secondary | ICD-10-CM | POA: Diagnosis not present

## 2023-11-20 DIAGNOSIS — D239 Other benign neoplasm of skin, unspecified: Secondary | ICD-10-CM | POA: Diagnosis not present

## 2023-11-20 DIAGNOSIS — M5416 Radiculopathy, lumbar region: Secondary | ICD-10-CM

## 2023-11-20 DIAGNOSIS — Z0001 Encounter for general adult medical examination with abnormal findings: Secondary | ICD-10-CM | POA: Insufficient documentation

## 2023-11-20 DIAGNOSIS — M150 Primary generalized (osteo)arthritis: Secondary | ICD-10-CM

## 2023-11-20 DIAGNOSIS — K929 Disease of digestive system, unspecified: Secondary | ICD-10-CM | POA: Insufficient documentation

## 2023-11-20 NOTE — Progress Notes (Addendum)
 Subjective:  Patient ID: Sean Cochran, male    DOB: 1961/01/18  Age: 63 y.o. MRN: [id number]  CC: Annual Exam, Hyperlipidemia, Osteoarthritis, and Back Pain   HPI Sean Cochran presents for a CPX and f/up ----  Discussed the use of AI scribe software for clinical note transcription with the patient, who gave verbal consent to proceed.  History of Present Illness Sean Cochran is a 63 year old male who presents with musculoskeletal pain.  He experiences various musculoskeletal pains and cramps, describing random aches and pains associated with aging. His feet tend to cramp when he is on his hands and knees or when putting on socks, though the cramps usually do not fully develop.  He has inconsistent hip pain, sometimes affecting one side more than the other, occasionally accompanied by nerve pain down his leg with numbness. Prolonged sitting at the computer and in the car exacerbates his symptoms. He has previously engaged in physical therapy and is interested in continuing it.  He describes soreness in his hand over the last couple of months, which is slowly improving. The hand sometimes cramps during activities like picking up heavy objects, and he experiences joint pain.  He has a history of lower back pain, which worsens with activities like yard work or prolonged sitting, especially during long car trips. He recalls being prescribed a muscle relaxer in the past for severe episodes.  He is currently taking finasteride  and uses ibuprofen as needed for pain relief. He prefers not to take daily medication unless necessary.  He remains active, playing tennis and doing yoga. He travels occasionally and has made several long car trips this year.  No chest pain, shortness of breath, dizziness, or lightheadedness during exercise, except for occasional lightheadedness in hot weather. No joint swelling or redness reported. Patient reports occasional muscle aches after activity.  No recent changes in weight or appetite.     Outpatient Medications Prior to Visit  Medication Sig Dispense Refill   albuterol  (VENTOLIN  HFA) 108 (90 Base) MCG/ACT inhaler Inhale 2 puffs into the lungs every 6 (six) hours as needed for wheezing or shortness of breath. 8.5 g 0   finasteride  (PROPECIA ) 1 MG tablet Take 1 tablet (1 mg total) by mouth daily. 90 tablet 3   amoxicillin -clavulanate (AUGMENTIN ) 875-125 MG tablet Take 1 tablet by mouth 2 (two) times daily. 20 tablet 0   benzonatate  (TESSALON ) 100 MG capsule Take 1 capsule (100 mg total) by mouth 3 (three) times daily as needed for cough. 30 capsule 0   COVID-19 mRNA vaccine 2023-2024 (COMIRNATY ) syringe Inject into the muscle. 0.3 mL 0   cyclobenzaprine  (FLEXERIL ) 10 MG tablet Take 1 tablet (10 mg total) by mouth 3 (three) times daily as needed. 20 tablet 0   influenza vac split quadrivalent PF (FLUARIX) 0.5 ML injection Inject into the muscle. 0.5 mL 0   influenza vac split trivalent PF (FLULAVAL ) 0.5 ML injection Inject into the muscle. 0.5 mL 0   predniSONE  (STERAPRED UNI-PAK 21 TAB) 10 MG (21) TBPK tablet Take as directed daily with breakfast. 21 tablet 0   sildenafil  (REVATIO ) 20 MG tablet Take 2-5 tablets (40-100 mg total) by mouth daily as needed. 50 tablet 3   sildenafil  (REVATIO ) 20 MG tablet Take 2-5 tablets (40-100 mg total) by mouth daily as needed. 50 tablet 3   No facility-administered medications prior to visit.    ROS Review of Systems  Constitutional:  Negative for appetite change, chills, diaphoresis, fatigue  and fever.  HENT: Negative.    Eyes: Negative.   Respiratory: Negative.  Negative for cough, choking, chest tightness, shortness of breath and stridor.   Cardiovascular:  Negative for chest pain, palpitations and leg swelling.  Gastrointestinal:  Negative for abdominal pain, blood in stool, constipation, diarrhea, nausea and vomiting.  Endocrine: Negative.   Genitourinary: Negative.  Negative for  difficulty urinating.  Musculoskeletal:  Positive for arthralgias and back pain. Negative for joint swelling and myalgias.  Skin:  Positive for color change.  Neurological:  Negative for dizziness and weakness.  Hematological:  Negative for adenopathy. Does not bruise/bleed easily.  Psychiatric/Behavioral: Negative.      Objective:  BP 128/80 (BP Location: Left Arm, Patient Position: Sitting, Cuff Size: Normal)   Pulse 71   Temp 98.3 F (36.8 C) (Oral)   Resp 16   Ht 5' 11 (1.803 m)   Wt 181 lb 12.8 oz (82.5 kg)   SpO2 98%   BMI 25.36 kg/m   BP Readings from Last 3 Encounters:  11/20/23 128/80  01/23/23 100/70  07/24/22 118/78    Wt Readings from Last 3 Encounters:  11/20/23 181 lb 12.8 oz (82.5 kg)  01/23/23 178 lb (80.7 kg)  07/24/22 175 lb (79.4 kg)    Physical Exam Vitals reviewed.  HENT:     Nose: Nose normal.     Mouth/Throat:     Mouth: Mucous membranes are moist.  Eyes:     General: No scleral icterus.    Conjunctiva/sclera: Conjunctivae normal.  Cardiovascular:     Rate and Rhythm: Normal rate and regular rhythm.     Pulses:          Carotid pulses are 1+ on the right side and 1+ on the left side.      Radial pulses are 1+ on the right side and 1+ on the left side.       Femoral pulses are 1+ on the right side and 1+ on the left side.      Popliteal pulses are 1+ on the right side and 1+ on the left side.       Dorsalis pedis pulses are 1+ on the right side and 1+ on the left side.       Posterior tibial pulses are 1+ on the right side and 1+ on the left side.     Heart sounds: S1 normal and S2 normal. No murmur heard.    No friction rub. No gallop.  Pulmonary:     Effort: Pulmonary effort is normal.     Breath sounds: No stridor. No wheezing, rhonchi or rales.  Abdominal:     General: Abdomen is flat.     Palpations: There is no mass.     Tenderness: There is no abdominal tenderness. There is no guarding.     Hernia: No hernia is present.   Musculoskeletal:        General: No swelling, tenderness or deformity.     Cervical back: Neck supple.     Right lower leg: No edema.     Left lower leg: No edema.  Skin:    General: Skin is warm and dry.     Findings: Lesion present. No rash.  Neurological:     General: No focal deficit present.     Mental Status: Mental status is at baseline.  Psychiatric:        Mood and Affect: Mood normal.        Behavior: Behavior normal.  Lab Results  Component Value Date   WBC 8.2 11/21/2023   HGB 15.5 11/21/2023   HCT 46.2 11/21/2023   PLT 214.0 11/21/2023   GLUCOSE 99 11/21/2023   CHOL 234 (H) 11/21/2023   TRIG 142.0 11/21/2023   HDL 46.80 11/21/2023   LDLDIRECT 148.0 07/07/2021   LDLCALC 159 (H) 11/21/2023   ALT 33 11/21/2023   AST 26 11/21/2023   NA 140 11/21/2023   K 4.2 11/21/2023   CL 103 11/21/2023   CREATININE 0.98 11/21/2023   BUN 18 11/21/2023   CO2 31 11/21/2023   TSH 2.23 11/21/2023   PSA 0.2 01/29/2023   INR 1.1 (H) 11/21/2023     Fibrosis 4 Score = 1.31 (Indeterminate)       Interpretation for patients with NAFLD          <1.30       -  F0-F1 (Low risk)          1.30-2.67 -  Indeterminate           >2.67      -  F3-F4 (High risk)     Validated for ages 27-65         Assessment & Plan:  Need for immunization against influenza -     Flu vaccine trivalent PF, 6mos and older(Flulaval ,Afluria,Fluarix,Fluzone)  Hyperlipidemia LDL goal <160- Will start a statin for CV risk reduction. -     Lipid panel; Future -     TSH; Future -     Hepatic function panel; Future -     CK; Future -     CBC with Differential/Platelet; Future -     Rosuvastatin  Calcium ; Take 1 tablet (10 mg total) by mouth daily.  Dispense: 90 tablet; Refill: 0  Nonalcoholic steatohepatitis (NASH) -     Hepatic function panel; Future -     CBC with Differential/Platelet; Future -     Protime-INR; Future  Encounter for general adult medical examination with abnormal findings- Exam  completed, labs reviewed, vaccines reviewed and updated, cancer screenings are UTD, pt ed material was given.   Immunization due -     Pneumococcal polysaccharide vaccine 23-valent greater than or equal to 2yo subcutaneous/IM  Multiple dysplastic nevi -     Ambulatory referral to Dermatology  Lumbar radiculopathy -     Ambulatory referral to Physical Therapy -     Meloxicam ; Take 1 tablet (15 mg total) by mouth daily.  Dispense: 90 tablet; Refill: 0  Primary osteoarthritis involving multiple joints -     C-reactive protein; Future -     Basic metabolic panel with GFR; Future -     CBC with Differential/Platelet; Future -     Ambulatory referral to Physical Therapy -     Meloxicam ; Take 1 tablet (15 mg total) by mouth daily.  Dispense: 90 tablet; Refill: 0     Follow-up: Return in about 6 months (around 05/19/2024).  Debby Molt, MD

## 2023-11-20 NOTE — Patient Instructions (Signed)
 Health Maintenance, Male  Adopting a healthy lifestyle and getting preventive care are important in promoting health and wellness. Ask your health care provider about:  The right schedule for you to have regular tests and exams.  Things you can do on your own to prevent diseases and keep yourself healthy.  What should I know about diet, weight, and exercise?  Eat a healthy diet    Eat a diet that includes plenty of vegetables, fruits, low-fat dairy products, and lean protein.  Do not eat a lot of foods that are high in solid fats, added sugars, or sodium.  Maintain a healthy weight  Body mass index (BMI) is a measurement that can be used to identify possible weight problems. It estimates body fat based on height and weight. Your health care provider can help determine your BMI and help you achieve or maintain a healthy weight.  Get regular exercise  Get regular exercise. This is one of the most important things you can do for your health. Most adults should:  Exercise for at least 150 minutes each week. The exercise should increase your heart rate and make you sweat (moderate-intensity exercise).  Do strengthening exercises at least twice a week. This is in addition to the moderate-intensity exercise.  Spend less time sitting. Even light physical activity can be beneficial.  Watch cholesterol and blood lipids  Have your blood tested for lipids and cholesterol at 62 years of age, then have this test every 5 years.  You may need to have your cholesterol levels checked more often if:  Your lipid or cholesterol levels are high.  You are older than 62 years of age.  You are at high risk for heart disease.  What should I know about cancer screening?  Many types of cancers can be detected early and may often be prevented. Depending on your health history and family history, you may need to have cancer screening at various ages. This may include screening for:  Colorectal cancer.  Prostate cancer.  Skin cancer.  Lung  cancer.  What should I know about heart disease, diabetes, and high blood pressure?  Blood pressure and heart disease  High blood pressure causes heart disease and increases the risk of stroke. This is more likely to develop in people who have high blood pressure readings or are overweight.  Talk with your health care provider about your target blood pressure readings.  Have your blood pressure checked:  Every 3-5 years if you are 24-52 years of age.  Every year if you are 3 years old or older.  If you are between the ages of 60 and 72 and are a current or former smoker, ask your health care provider if you should have a one-time screening for abdominal aortic aneurysm (AAA).  Diabetes  Have regular diabetes screenings. This checks your fasting blood sugar level. Have the screening done:  Once every three years after age 66 if you are at a normal weight and have a low risk for diabetes.  More often and at a younger age if you are overweight or have a high risk for diabetes.  What should I know about preventing infection?  Hepatitis B  If you have a higher risk for hepatitis B, you should be screened for this virus. Talk with your health care provider to find out if you are at risk for hepatitis B infection.  Hepatitis C  Blood testing is recommended for:  Everyone born from [birth year] through 1964.  Anyone  with known risk factors for hepatitis C.  Sexually transmitted infections (STIs)  You should be screened each year for STIs, including gonorrhea and chlamydia, if:  You are sexually active and are younger than 62 years of age.  You are older than 62 years of age and your health care provider tells you that you are at risk for this type of infection.  Your sexual activity has changed since you were last screened, and you are at increased risk for chlamydia or gonorrhea. Ask your health care provider if you are at risk.  Ask your health care provider about whether you are at high risk for HIV. Your health care provider  may recommend a prescription medicine to help prevent HIV infection. If you choose to take medicine to prevent HIV, you should first get tested for HIV. You should then be tested every 3 months for as long as you are taking the medicine.  Follow these instructions at home:  Alcohol use  Do not drink alcohol if your health care provider tells you not to drink.  If you drink alcohol:  Limit how much you have to 0-2 drinks a day.  Know how much alcohol is in your drink. In the [place] one drink equals one 12 oz bottle of beer (355 mL), one 5 oz glass of wine (148 mL), or one 1 oz glass of hard liquor (44 mL).  Lifestyle  Do not use any products that contain nicotine or tobacco. These products include cigarettes, chewing tobacco, and vaping devices, such as e-cigarettes. If you need help quitting, ask your health care provider.  Do not use street drugs.  Do not share needles.  Ask your health care provider for help if you need support or information about quitting drugs.  General instructions  Schedule regular health, dental, and eye exams.  Stay current with your vaccines.  Tell your health care provider if:  You often feel depressed.  You have ever been abused or do not feel safe at home.  Summary  Adopting a healthy lifestyle and getting preventive care are important in promoting health and wellness.  Follow your health care provider's instructions about healthy diet, exercising, and getting tested or screened for diseases.  Follow your health care provider's instructions on monitoring your cholesterol and blood pressure.  This information is not intended to replace advice given to you by your health care provider. Make sure you discuss any questions you have with your health care provider.  Document Revised: 07/11/2020 Document Reviewed: 07/11/2020  Elsevier Patient Education  2024 [employer].

## 2023-11-21 ENCOUNTER — Ambulatory Visit: Payer: Self-pay | Admitting: Internal Medicine

## 2023-11-21 ENCOUNTER — Other Ambulatory Visit (INDEPENDENT_AMBULATORY_CARE_PROVIDER_SITE_OTHER)

## 2023-11-21 ENCOUNTER — Other Ambulatory Visit (HOSPITAL_BASED_OUTPATIENT_CLINIC_OR_DEPARTMENT_OTHER): Payer: Self-pay

## 2023-11-21 DIAGNOSIS — E785 Hyperlipidemia, unspecified: Secondary | ICD-10-CM

## 2023-11-21 DIAGNOSIS — M150 Primary generalized (osteo)arthritis: Secondary | ICD-10-CM | POA: Diagnosis not present

## 2023-11-21 DIAGNOSIS — K7581 Nonalcoholic steatohepatitis (NASH): Secondary | ICD-10-CM | POA: Diagnosis not present

## 2023-11-21 LAB — CBC WITH DIFFERENTIAL/PLATELET
Basophils Absolute: 0.1 K/uL (ref 0.0–0.1)
Basophils Relative: 1 % (ref 0.0–3.0)
Eosinophils Absolute: 0.4 K/uL (ref 0.0–0.7)
Eosinophils Relative: 5.3 % — ABNORMAL HIGH (ref 0.0–5.0)
HCT: 46.2 % (ref 39.0–52.0)
Hemoglobin: 15.5 g/dL (ref 13.0–17.0)
Lymphocytes Relative: 14.3 % (ref 12.0–46.0)
Lymphs Abs: 1.2 K/uL (ref 0.7–4.0)
MCHC: 33.5 g/dL (ref 30.0–36.0)
MCV: 90 fl (ref 78.0–100.0)
Monocytes Absolute: 0.8 K/uL (ref 0.1–1.0)
Monocytes Relative: 9.1 % (ref 3.0–12.0)
Neutro Abs: 5.8 K/uL (ref 1.4–7.7)
Neutrophils Relative %: 70.3 % (ref 43.0–77.0)
Platelets: 214 K/uL (ref 150.0–400.0)
RBC: 5.13 Mil/uL (ref 4.22–5.81)
RDW: 13.7 % (ref 11.5–15.5)
WBC: 8.2 K/uL (ref 4.0–10.5)

## 2023-11-21 LAB — BASIC METABOLIC PANEL WITH GFR
BUN: 18 mg/dL (ref 6–23)
CO2: 31 meq/L (ref 19–32)
Calcium: 9.7 mg/dL (ref 8.4–10.5)
Chloride: 103 meq/L (ref 96–112)
Creatinine, Ser: 0.98 mg/dL (ref 0.40–1.50)
GFR: 82.86 mL/min (ref >60.00)
Glucose, Bld: 99 mg/dL (ref 70–99)
Potassium: 4.2 meq/L (ref 3.5–5.1)
Sodium: 140 meq/L (ref 135–145)

## 2023-11-21 LAB — HEPATIC FUNCTION PANEL
ALT: 33 U/L (ref 0–53)
AST: 26 U/L (ref 0–37)
Albumin: 4.8 g/dL (ref 3.5–5.2)
Alkaline Phosphatase: 61 U/L (ref 39–117)
Bilirubin, Direct: 0.2 mg/dL (ref 0.0–0.3)
Total Bilirubin: 1 mg/dL (ref 0.2–1.2)
Total Protein: 6.9 g/dL (ref 6.0–8.3)

## 2023-11-21 LAB — PROTIME-INR
INR: 1.1 ratio — ABNORMAL HIGH (ref 0.8–1.0)
Prothrombin Time: 11.2 s (ref 9.6–13.1)

## 2023-11-21 LAB — LIPID PANEL
Cholesterol: 234 mg/dL — ABNORMAL HIGH (ref 0–200)
HDL: 46.8 mg/dL (ref >39.00)
LDL Cholesterol: 159 mg/dL — ABNORMAL HIGH (ref 0–99)
NonHDL: 187.64
Total CHOL/HDL Ratio: 5
Triglycerides: 142 mg/dL (ref 0.0–149.0)
VLDL: 28.4 mg/dL (ref 0.0–40.0)

## 2023-11-21 LAB — CK: Total CK: 64 U/L (ref 17–232)

## 2023-11-21 LAB — TSH: TSH: 2.23 u[IU]/mL (ref 0.35–5.50)

## 2023-11-21 LAB — C-REACTIVE PROTEIN: CRP: <1 mg/dL (ref 0.5–20.0)

## 2023-11-21 MED ORDER — MELOXICAM 15 MG PO TABS
15.0000 mg | ORAL_TABLET | Freq: Every day | ORAL | 0 refills | Status: AC
  Filled 2023-11-21: qty 90, 90d supply, fill #0

## 2023-11-21 MED ORDER — ROSUVASTATIN CALCIUM 10 MG PO TABS
10.0000 mg | ORAL_TABLET | Freq: Every day | ORAL | 0 refills | Status: AC
  Filled 2023-11-21: qty 90, 90d supply, fill #0

## 2023-11-22 ENCOUNTER — Other Ambulatory Visit (HOSPITAL_BASED_OUTPATIENT_CLINIC_OR_DEPARTMENT_OTHER): Payer: Self-pay

## 2023-11-26 ENCOUNTER — Other Ambulatory Visit (HOSPITAL_BASED_OUTPATIENT_CLINIC_OR_DEPARTMENT_OTHER): Payer: Self-pay

## 2023-11-26 MED ORDER — COMIRNATY 30 MCG/0.3ML IM SUSY
0.3000 mL | PREFILLED_SYRINGE | Freq: Once | INTRAMUSCULAR | 0 refills | Status: AC
  Filled 2023-11-26: qty 0.3, 1d supply, fill #0

## 2023-12-03 ENCOUNTER — Encounter: Payer: Self-pay | Admitting: Sports Medicine

## 2023-12-06 ENCOUNTER — Ambulatory Visit: Admitting: Sports Medicine

## 2023-12-12 ENCOUNTER — Ambulatory Visit: Attending: Internal Medicine | Admitting: Physical Therapy

## 2023-12-12 ENCOUNTER — Other Ambulatory Visit: Payer: Self-pay

## 2023-12-12 ENCOUNTER — Encounter: Payer: Self-pay | Admitting: Physical Therapy

## 2023-12-12 DIAGNOSIS — M5416 Radiculopathy, lumbar region: Secondary | ICD-10-CM | POA: Insufficient documentation

## 2023-12-12 DIAGNOSIS — M150 Primary generalized (osteo)arthritis: Secondary | ICD-10-CM | POA: Diagnosis not present

## 2023-12-12 DIAGNOSIS — R293 Abnormal posture: Secondary | ICD-10-CM | POA: Insufficient documentation

## 2023-12-12 DIAGNOSIS — M6281 Muscle weakness (generalized): Secondary | ICD-10-CM | POA: Insufficient documentation

## 2023-12-12 DIAGNOSIS — M25552 Pain in left hip: Secondary | ICD-10-CM | POA: Insufficient documentation

## 2023-12-12 NOTE — Therapy (Signed)
 OUTPATIENT PHYSICAL THERAPY THORACOLUMBAR EVALUATION   Patient Name: Sean Cochran MRN: [id number] DOB:Mar 15, 1960, 63 y.o., male Today's Date: 12/12/2023  END OF SESSION:  PT End of Session - 12/12/23 1653     Visit Number 1    Date for Recertification  02/06/24    Authorization Type UHC - medical necessity    PT Start Time 1400    PT Stop Time 1450    PT Time Calculation (min) 50 min    Activity Tolerance Patient tolerated treatment well    Behavior During Therapy [facility] for tasks assessed/performed          Past Medical History:  Diagnosis Date   Abnormal finding on MRI of brain 10/04/2014   Back pain    Colon polyp 02/19/2013   Dr. Kristie - Repeat in 5 years   Constipation    Hip pain    History of pineal cyst    Hyperlipidemia    Memory loss    Past Surgical History:  Procedure Laterality Date   BICEPS TENDON REPAIR Left    COLONOSCOPY     ROTATOR CUFF REPAIR Right    TENNIS ELBOW RELEASE/NIRSCHEL PROCEDURE Left 08/31/2021   Procedure: TENNIS ELBOW RELEASE/NIRSCHEL PROCEDURE;  Surgeon: Josefina Chew, MD;  Location: [facility];  Service: Orthopedics;  Laterality: Left;   TONSILLECTOMY     VASECTOMY     Patient Active Problem List   Diagnosis Date Noted   Family history of colonic polyps 11/20/2023   Need for immunization against influenza 11/20/2023   Encounter for general adult medical examination with abnormal findings 11/20/2023   Immunization due 11/20/2023   Multiple dysplastic nevi 11/20/2023   Primary osteoarthritis involving multiple joints 11/20/2023   Lumbar radiculopathy 07/04/2021   Allergic rhinitis 06/20/2020   Nonalcoholic steatohepatitis (NASH) 06/17/2018   Eczema 12/31/2016   Abnormal finding on MRI of brain 10/04/2014   Hyperlipidemia LDL goal <160 05/08/2013    PCP: Joshua Debby LITTIE, MD  REFERRING PROVIDER: Joshua Debby LITTIE, MD  REFERRING DIAG: M54.16 (ICD-10-CM) - Lumbar radiculopathy M15.0 (ICD-10-CM) - Primary  osteoarthritis involving multiple joints  Rationale for Evaluation and Treatment: Rehabilitation  THERAPY DIAG:  Radiculopathy, lumbar region  Muscle weakness (generalized)  Abnormal posture  Pain in left hip  ONSET DATE: Spring 2025  SUBJECTIVE:                                                                                                                                                                                           SUBJECTIVE STATEMENT: I have been traveling, driving and sitting more for work so my back and hips are  getting stiff, painful, numbness.  My central low back gets really stiff.  My left leg can't sit as well into [place] [employer].    PERTINENT HISTORY:  Plays tennis Weight workouts several days a week 3-4x/week various stretches and HEP from PT  PAIN:  PAIN:  Are you having pain? Yes NPRS scale: 3-8/10 Pain location: central and Rt/Lt lumbar, SITS bones, Lt>Rt gluteals, Lt buttock and hamstring numbness, occassional numbness all the way down Lt leg to plantar foot Pain orientation: Left and Bilateral  PAIN TYPE: aching, dull, tight, and numb Pain description: constant  Aggravating factors: AM stiffness, sitting, sit to stand after prolonged sitting, doing dishes/bending  Relieving factors: heat   PRECAUTIONS: None  RED FLAGS: None   WEIGHT BEARING RESTRICTIONS: No  FALLS:  Has patient fallen in last 6 months? No  LIVING ENVIRONMENT: Lives with: lives with their spouse   OCCUPATION: full time for [employer] - drives a lot/travel - one way trips are about 1.5 hours  PLOF: Independent  PATIENT GOALS: better mobility, less pain  NEXT MD VISIT: as needed  OBJECTIVE:  Note: Objective measures were completed at Evaluation unless otherwise noted.  DIAGNOSTIC FINDINGS:  From 2023: IMPRESSION: LUMBAR MRI 1. Facet arthropathy most advanced at L4-L5, with a small effusion on the left and periarticular edema and enhancement on the  right. This may reflect a source of pain. 2. Diffuse disc bulge at L4-L5 resulting in narrowing of the subarticular zones without evidence of frank nerve root impingement, and mild bilateral neural foraminal stenosis with possible irritation of the exiting right L4 nerve root by extraforaminal disc material. 3. Diffuse disc bulge at L5-S1 which contacts the traversing right S1 nerve root. 4. No other significant spinal canal or neural foraminal stenosis, and no other evidence of nerve root impingement.  IMPRESSION: PELVIC MRI Small volume of fluid in the trochanteric bursa bilaterally suggestive of bursitis.   Mild left hip degenerative change.    PATIENT SURVEYS:  Modified Oswestry:  MODIFIED OSWESTRY DISABILITY SCALE  Date: 12/12/23 Score  Pain intensity 4 =  Pain medication provides me with little relief from pain.  2. Personal care (washing, dressing, etc.) 2 =  It is painful to take care of myself, and I am slow and careful.  3. Lifting 3 = Pain prevents me from lifting heavy weights, but I can manage light to medium weights if they are conveniently positioned  4. Walking 0 = Pain does not prevent me from walking any distance  5. Sitting 3 =  Pain prevents me from sitting more than  hour.  6. Standing 3 =  Pain prevents me from standing more than 1/2 hour.  7. Sleeping 0 = Pain does not prevent me from sleeping well.  8. Social Life 1 =  My social life is normal, but it increases my level of pain.  9. Traveling 4 = My pain restricts my travel to short necessary journeys under 1/2 hour.  10. Employment/ Homemaking 2 = I can perform most of my homemaking/job duties, but pain prevents me from performing more physically stressful activities (eg, lifting, vacuuming).  Total 23/50 = 46%   Interpretation of scores: Score Category Description  0-20% Minimal Disability The patient can cope with most living activities. Usually no treatment is indicated apart from advice on lifting,  sitting and exercise  21-40% Moderate Disability The patient experiences more pain and difficulty with sitting, lifting and standing. Travel and social life are more difficult and they may  be disabled from work. Personal care, sexual activity and sleeping are not grossly affected, and the patient can usually be managed by conservative means  41-60% Severe Disability Pain remains the main problem in this group, but activities of daily living are affected. These patients require a detailed investigation  61-80% Crippled Back pain impinges on all aspects of the patient's life. Positive intervention is required  81-100% Bed-bound These patients are either bed-bound or exaggerating their symptoms  Bluford FORBES Zoe DELENA Karon DELENA, et al. Surgery versus conservative management of stable thoracolumbar fracture: the PRESTO feasibility RCT. [place]): [employer]; 2021 Nov. [facility] [employer] Assessment, No. 25.62.) Appendix 3, Oswestry Disability Index category descriptors. Available from: FindJewel[employer].cz  Minimally Clinically Important Difference (MCID) = 12.8%  COGNITION: Overall cognitive status: Within functional limits for tasks assessed     SENSATION: Onset of tingling/numbness into Lt hip, posterior thigh and plantar foot with prolonged sitting and walking   MUSCLE LENGTH: Hamstrings:  Lt more limited by neural tension with + SLR at 50 deg,  Rt - limited hamstring length 60 deg  Bil adductors tight 25%  POSTURE: sway back posture, reduced lumbar lordosis, pelvic torsion into Lt rot present  PALPATION: Tight lumbar PA throughout lumbar spine, especially L4-S1 Trigger points present bil glut med, bil hamstrings, bil adductor magnus, bil adductors, Lt piriformis Tenderness present along bil hamstring length, proximal attachments of adductors bil, bil QL, lumbar multifidi bil  LUMBAR ROM:   AROM eval  Flexion Fingers to ankles, tight bil HS and  lumbar spine  Extension Full, sore lumbar  Right lateral flexion Full Rt SI pain  Left lateral flexion Full Rt SI pain  Right rotation Full no pain  Left rotation Full no pain   (Blank rows = not tested)  LOWER EXTREMITY ROM:    Hip flexion and ext WNL bil Slight end range limitation without pain Lt hip ER, IR, abd  LOWER EXTREMITY MMT:    MMT Right eval Left eval  Hip flexion    Hip extension 5 4  Hip abduction 5 4  Hip adduction    Hip internal rotation    Hip external rotation    Knee flexion 5 4  Knee extension    Ankle dorsiflexion    Ankle plantarflexion    Ankle inversion    Ankle eversion     (Blank rows = not tested)  LUMBAR SPECIAL TESTS:  Straight leg raise test: Positive on Lt at 50 deg    GAIT: WNL  TREATMENT DATE:  12/12/23 Pt education regarding evaluation findings Pt education about neural tension vs muscle tightness HEP initiated                                                                                                                          PATIENT EDUCATION:  Education details: [email or web address] Person educated: Patient Education method: Programmer, multimedia, Facilities manager, Verbal cues, and Handouts Education comprehension: verbalized understanding and returned demonstration  HOME EXERCISE PROGRAM:  Access Code: [email or web address] URL: [email or web address] Date: 12/12/2023 Prepared by: Orvil Natalyn Szymanowski  Exercises - Seated Hamstring Stretch  - 1 x daily - 7 x weekly - 1 sets - 2 reps - 20-30 hold - Side Plank on Knees  - 1 x daily - 7 x weekly - 3 sets - 10 reps - Standard Plank  - 1 x daily - 7 x weekly - 3 sets - 10 reps - Supine Sciatic Nerve Glide  - 1 x daily - 7 x weekly - 2 sets - 30-60 reps - Marching Bridge  - 1 x daily - 7 x weekly - 2 sets - 10 reps  ASSESSMENT:  CLINICAL IMPRESSION: Patient is a 63 y.o. male who was seen today for physical therapy evaluation and treatment for lumbar radiculopathy.  Pt last lumbar and pelvic  imaging done in 2023 showed degenerative changes in lumbar spine and mild degenerative changed in Lt hip.  Pt drives, travels, sits a lot for work and is not tolerating these demands as well as he used to.  Sitting is limited to < 30 min before onset of symptoms.  He will get stiffness in lumbar spine making sit to stand slow, cautious and painful.  He also gets tingling/numbness into Lt LE with intermittent pain shooting into Lt LE (lateral hip, posterior thigh, plantar foot).  These symptoms have progressed since he was last seen in PT.  Pt has slight end range limitation in Lt hip ROM but near full and symmetrical to Rt hip.  He has signif neural tension in Lt LE with + SLR and slight weakness 4/5 in Lt hip ext, abd.  Trigger points are present in lumbar, pelvic, hip and LE soft tissues. Pt will benefit from skilled PT to address findings, reduce pain, and maximize tolerance of daily demands.  OBJECTIVE IMPAIRMENTS: decreased activity tolerance, decreased mobility, difficulty walking, decreased ROM, decreased strength, hypomobility, increased muscle spasms, impaired flexibility, impaired sensation, impaired tone, improper body mechanics, postural dysfunction, and pain.   ACTIVITY LIMITATIONS: carrying, lifting, bending, sitting, standing, dressing, and locomotion level  PARTICIPATION LIMITATIONS: cleaning, laundry, driving, shopping, community activity, occupation, and yard work  PERSONAL FACTORS: Time since onset of injury/illness/exacerbation are also affecting patient's functional outcome.   REHAB POTENTIAL: Excellent  CLINICAL DECISION MAKING: Evolving/moderate complexity  EVALUATION COMPLEXITY: Moderate   GOALS: Goals reviewed with patient? Yes  SHORT TERM GOALS: Target date: 01/09/24  Pt will be ind with initial HEP Baseline: Goal status: INITIAL  2.  Pt will report reduced pain by at least 25% with work travel. Baseline:  Goal status: INITIAL    LONG TERM GOALS: Target date:  02/06/24  Pt will be ind with advanced HEP Baseline:  Goal status: INITIAL  2.  Pt will be able to drive up to 1.5 hours for work trips with improved pain not to exceed 5/10 Baseline:  Goal status: INITIAL  3.  Pt will improve ODI score by at least 12 points to reduce disability scale level from severe to moderate Baseline: 46%, goal is 34% Goal status: INITIAL  4.  Pt will demo at least 4+/5 strength in Lt hip abd and ext for improved stability of Lt hip with closed chain activities like squatting, stairs, walking Baseline: 4/5 Goal status: INITIAL  5.  Pt will be able to bend to load and empty dishwasher with proper body mechanics and at least 75% more confidence in back stability with this task Baseline:  Goal status: INITIAL  6.  Pt will  be able to step into pants and don shoes and socks with min pain Baseline:  Goal status: INITIAL  PLAN:  PT FREQUENCY: 1-2x/week  PT DURATION: 8 weeks  PLANNED INTERVENTIONS: 97110-Therapeutic exercises, 97530- Therapeutic activity, [id number]- Neuromuscular re-education, 97535- Self Care, [id number]- Manual therapy, 97012- Traction (mechanical), [phone] (1-2 muscles), 20561 (3+ muscles)- Dry Needling, Patient/Family education, Taping, Joint mobilization, Spinal mobilization, Cryotherapy, and Moist heat.  PLAN FOR NEXT SESSION: try mechanical lumbar traction, review and progress HEP.  DN to lumbar, glutes, adductors, hamstrings. Core and hip strength, lumbar mobs   Artie Mcintyre, PT 12/12/23 4:54 PM

## 2023-12-13 ENCOUNTER — Ambulatory Visit (INDEPENDENT_AMBULATORY_CARE_PROVIDER_SITE_OTHER): Admitting: Sports Medicine

## 2023-12-13 ENCOUNTER — Encounter: Payer: Self-pay | Admitting: Sports Medicine

## 2023-12-13 VITALS — BP 122/70 | Ht 71.0 in | Wt 181.0 lb

## 2023-12-13 DIAGNOSIS — M25522 Pain in left elbow: Secondary | ICD-10-CM

## 2023-12-13 DIAGNOSIS — M5416 Radiculopathy, lumbar region: Secondary | ICD-10-CM | POA: Diagnosis not present

## 2023-12-13 NOTE — Progress Notes (Signed)
   Subjective:    Patient ID: Sean Cochran, male    DOB: 11-Nov-1960, 63 y.o.   MRN: [id number]  HPI chief complaint: Left elbow pain   Sean Cochran presents today with 2 weeks of lateral left elbow pain.  He is left-hand dominant.  He has undergone a previous tennis elbow release with Dr. Josefina.  He has done well postoperatively.  Two weeks ago while pushing himself up in a recliner by leaning on his forearms, he felt his left arm rotate and a sudden twinge of pain.  Since then, he has noticed occasional discomfort that feels different than his previous tennis elbow.  He has been able to stay active working out and playing tennis.  He has not tried anything specific for the pain.  He did notice some soreness recently with carrying bags while traveling.   Review of Systems As above    Objective:   Physical Exam  Left elbow: Full range of motion.  No effusion.  No soft tissue swelling.  There is no tenderness to palpation over the lateral epicondyle.  There are some tenderness to palpation in the common extensor muscle belly but no reproducible pain with ECRB testing.  Good pulses distally.  Limited MSK ultrasound of the lateral left elbow shows no abnormality of the common extensor tendon.  No increased Doppler flow.      Assessment & Plan:   Left elbow pain  Sean Cochran likely has some discomfort from residual scar tissue from his previous surgery.  Physical exam today does not suggest any significant reinjury to the common extensor tendon.  I recommended a trial of topical Voltaren for the next 7 days.  He may also benefit from physical therapy so we will give him an updated PT prescription for both his left elbow pain and his recently diagnosed lumbar radiculopathy (diagnosed by his PCP).  No restrictions on activity but he will use pain as his guide and he will follow-up for ongoing or recalcitrant issues.  This note was dictated using Dragon naturally speaking software and may contain errors in  syntax, spelling, or content which have not been identified prior to signing this note.

## 2023-12-19 ENCOUNTER — Encounter: Admitting: Physical Therapy

## 2023-12-26 ENCOUNTER — Encounter: Admitting: Physical Therapy

## 2024-01-02 ENCOUNTER — Encounter: Admitting: Physical Therapy

## 2024-01-10 ENCOUNTER — Ambulatory Visit: Attending: Internal Medicine | Admitting: Physical Therapy

## 2024-01-10 ENCOUNTER — Encounter: Payer: Self-pay | Admitting: Physical Therapy

## 2024-01-10 DIAGNOSIS — M6281 Muscle weakness (generalized): Secondary | ICD-10-CM | POA: Insufficient documentation

## 2024-01-10 DIAGNOSIS — M5416 Radiculopathy, lumbar region: Secondary | ICD-10-CM | POA: Diagnosis present

## 2024-01-10 DIAGNOSIS — M25552 Pain in left hip: Secondary | ICD-10-CM | POA: Insufficient documentation

## 2024-01-10 DIAGNOSIS — R293 Abnormal posture: Secondary | ICD-10-CM | POA: Insufficient documentation

## 2024-01-10 DIAGNOSIS — M545 Low back pain, unspecified: Secondary | ICD-10-CM | POA: Diagnosis present

## 2024-01-10 DIAGNOSIS — M25551 Pain in right hip: Secondary | ICD-10-CM | POA: Diagnosis present

## 2024-01-10 NOTE — Therapy (Signed)
 OUTPATIENT PHYSICAL THERAPY THORACOLUMBAR TREATMENT   Patient Name: LEMAR BAKOS MRN: [id number] DOB:04-Feb-1961, 63 y.o., male Today's Date: 01/10/2024  END OF SESSION:  PT End of Session - 01/10/24 0934     Visit Number 2    Date for Recertification  02/06/24    Authorization Type U[employer] - medical necessity    PT Start Time 0932    PT Stop Time 1024    PT Time Calculation (min) 52 min    Activity Tolerance Patient tolerated treatment well    Behavior During Therapy [facility] for tasks assessed/performed           Past Medical History:  Diagnosis Date   Abnormal finding on MRI of brain 10/04/2014   Back pain    Colon polyp 02/19/2013   Dr. Kristie - Repeat in 5 years   Constipation    Hip pain    History of pineal cyst    Hyperlipidemia    Memory loss    Past Surgical History:  Procedure Laterality Date   BICEPS TENDON REPAIR Left    COLONOSCOPY     ROTATOR CUFF REPAIR Right    TENNIS ELBOW RELEASE/NIRSCHEL PROCEDURE Left 08/31/2021   Procedure: TENNIS ELBOW RELEASE/NIRSCHEL PROCEDURE;  Surgeon: Josefina Chew, MD;  Location: [facility];  Service: Orthopedics;  Laterality: Left;   TONSILLECTOMY     VASECTOMY     Patient Active Problem List   Diagnosis Date Noted   Family history of colonic polyps 11/20/2023   Need for immunization against influenza 11/20/2023   Encounter for general adult medical examination with abnormal findings 11/20/2023   Immunization due 11/20/2023   Multiple dysplastic nevi 11/20/2023   Primary osteoarthritis involving multiple joints 11/20/2023   Lumbar radiculopathy 07/04/2021   Allergic rhinitis 06/20/2020   Nonalcoholic steatohepatitis (NASH) 06/17/2018   Eczema 12/31/2016   Abnormal finding on MRI of brain 10/04/2014   Hyperlipidemia LDL goal <160 05/08/2013    PCP: Joshua Debby LITTIE, MD  REFERRING PROVIDER: Joshua Debby LITTIE, MD  REFERRING DIAG: M54.16 (ICD-10-CM) - Lumbar radiculopathy M15.0 (ICD-10-CM) - Primary  osteoarthritis involving multiple joints  Rationale for Evaluation and Treatment: Rehabilitation  THERAPY DIAG:  Radiculopathy, lumbar region  Muscle weakness (generalized)  Abnormal posture  Pain in left hip  Bilateral hip pain  Bilateral low back pain without sciatica, unspecified chronicity  ONSET DATE: Spring 2025  SUBJECTIVE:                                                                                                                                                                                           SUBJECTIVE STATEMENT: Have had  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 I have a lot of pain, stiffness and LE symptoms in both legs to feet.  EVAL: I have been traveling, driving and sitting more for work so my back and hips are getting stiff, painful, numbness.  My central low back gets really stiff.  My left leg can't sit as well into [place].    PERTINENT HISTORY:  Plays tennis Weight workouts several days a week 3-4x/week various stretches and HEP from PT  PAIN:  PAIN:  Are you having pain? Yes NPRS scale: 7/10 Pain location: central and Rt/Lt lumbar, SITS bones, Lt>Rt gluteals, Lt buttock and hamstring numbness, occassional numbness all the way down Lt leg to plantar foot Pain orientation: Left and Bilateral  PAIN TYPE: aching, dull, tight, and numb Pain description: constant  Aggravating factors: AM stiffness, sitting, sit to stand after prolonged sitting, doing dishes/bending  Relieving factors: heat   PRECAUTIONS: None  RED FLAGS: None   WEIGHT BEARING RESTRICTIONS: No  FALLS:  Has patient fallen in last 6 months? No  LIVING ENVIRONMENT: Lives with: lives with their spouse   OCCUPATION: full time for [employer] - drives a lot/travel - one way trips are about 1.5 hours  PLOF: Independent  PATIENT GOALS: better mobility, less pain  NEXT MD VISIT: as needed  OBJECTIVE:  Note: Objective measures were  completed at Evaluation unless otherwise noted.  DIAGNOSTIC FINDINGS:  From 2023: IMPRESSION: LUMBAR MRI 1. Facet arthropathy most advanced at L4-L5, with a small effusion on the left and periarticular edema and enhancement on the right. This may reflect a source of pain. 2. Diffuse disc bulge at L4-L5 resulting in narrowing of the subarticular zones without evidence of frank nerve root impingement, and mild bilateral neural foraminal stenosis with possible irritation of the exiting right L4 nerve root by extraforaminal disc material. 3. Diffuse disc bulge at L5-S1 which contacts the traversing right S1 nerve root. 4. No other significant spinal canal or neural foraminal stenosis, and no other evidence of nerve root impingement.  IMPRESSION: PELVIC MRI Small volume of fluid in the trochanteric bursa bilaterally suggestive of bursitis.   Mild left hip degenerative change.    PATIENT SURVEYS:  Modified Oswestry:  MODIFIED OSWESTRY DISABILITY SCALE  Date: 12/12/23 Score  Pain intensity 4 =  Pain medication provides me with little relief from pain.  2. Personal care (washing, dressing, etc.) 2 =  It is painful to take care of myself, and I am slow and careful.  3. Lifting 3 = Pain prevents me from lifting heavy weights, but I can manage light to medium weights if they are conveniently positioned  4. Walking 0 = Pain does not prevent me from walking any distance  5. Sitting 3 =  Pain prevents me from sitting more than  hour.  6. Standing 3 =  Pain prevents me from standing more than 1/2 hour.  7. Sleeping 0 = Pain does not prevent me from sleeping well.  8. Social Life 1 =  My social life is normal, but it increases my level of pain.  9. Traveling 4 = My pain restricts my travel to short necessary journeys under 1/2 hour.  10. Employment/ Homemaking 2 = I can perform most of my homemaking/job duties, but pain prevents me from performing more physically stressful activities (eg,  lifting, vacuuming).  Total 23/50 = 46%   Interpretation of scores: Score Category Description  0-20% Minimal Disability The patient  can cope with most living activities. Usually no treatment is indicated apart from advice on lifting, sitting and exercise  21-40% Moderate Disability The patient experiences more pain and difficulty with sitting, lifting and standing. Travel and social life are more difficult and they may be disabled from work. Personal care, sexual activity and sleeping are not grossly affected, and the patient can usually be managed by conservative means  41-60% Severe Disability Pain remains the main problem in this group, but activities of daily living are affected. These patients require a detailed investigation  61-80% Crippled Back pain impinges on all aspects of the patient's life. Positive intervention is required  81-100% Bed-bound These patients are either bed-bound or exaggerating their symptoms  Bluford FORBES Zoe DELENA Karon DELENA, et al. Surgery versus conservative management of stable thoracolumbar fracture: the PRESTO feasibility RCT. [place] ([place]): [employer]; 2021 Nov. [facility] Technology Assessment, No. 25.62.) Appendix 3, Oswestry Disability Index category descriptors. Available from: [email or web address].cz  Minimally Clinically Important Difference (MCID) = 12.8%  COGNITION: Overall cognitive status: Within functional limits for tasks assessed     SENSATION: Onset of tingling/numbness into Lt hip, posterior thigh and plantar foot with prolonged sitting and walking   MUSCLE LENGTH: Hamstrings:  Lt more limited by neural tension with + SLR at 50 deg,  Rt - limited hamstring length 60 deg  Bil adductors tight 25%  POSTURE: sway back posture, reduced lumbar lordosis, pelvic torsion into Lt rot present  PALPATION: Tight lumbar PA throughout lumbar spine, especially L4-S1 Trigger points present bil glut med, bil hamstrings,  bil adductor magnus, bil adductors, Lt piriformis Tenderness present along bil hamstring length, proximal attachments of adductors bil, bil QL, lumbar multifidi bil  LUMBAR ROM:   AROM eval  Flexion Fingers to ankles, tight bil HS and lumbar spine  Extension Full, sore lumbar  Right lateral flexion Full Rt SI pain  Left lateral flexion Full Rt SI pain  Right rotation Full no pain  Left rotation Full no pain   (Blank rows = not tested)  LOWER EXTREMITY ROM:    Hip flexion and ext WNL bil Slight end range limitation without pain Lt hip ER, IR, abd  LOWER EXTREMITY MMT:    MMT Right eval Left eval  Hip flexion    Hip extension 5 4  Hip abduction 5 4  Hip adduction    Hip internal rotation    Hip external rotation    Knee flexion 5 4  Knee extension    Ankle dorsiflexion    Ankle plantarflexion    Ankle inversion    Ankle eversion     (Blank rows = not tested)  LUMBAR SPECIAL TESTS:  Straight leg raise test: Positive on Lt at 50 deg    GAIT: WNL  TREATMENT DATE:  01/10/24 Supine sciatic flossing at ankle bil Standing holding single kbell 10lb overhead with alt LE march each hand Squat to chair holding 10lb kbell with 5 OH press, 5 chest press, 5 hold at chest Leaning dynamic adductor stretch with sitting back into SITS bone Cable pulley 10lb row with rotation alt directions x20 Pallof press double green handles x10, then 5x trunk rotation Mechanical lumbar traction min 75lb max 100lb x10' Updates to HEP during traction  12/12/23 Pt education regarding evaluation findings Pt education about neural tension vs muscle tightness HEP initiated  PATIENT EDUCATION:  Education details: [email or web address] Person educated: Patient Education method: Explanation, Demonstration, Verbal cues, and Handouts Education comprehension: verbalized understanding and  returned demonstration  HOME EXERCISE PROGRAM: Access Code: [email or web address] URL: [email or web address] Date: 01/10/2024 Prepared by: Orvil Adelise Buswell  Exercises - Seated Hamstring Stretch  - 1 x daily - 7 x weekly - 1 sets - 2 reps - 20-30 hold - Side Plank on Knees  - 1 x daily - 7 x weekly - 1 sets - 3 reps - 15 hold - Standard Plank  - 1 x daily - 7 x weekly - 1 sets - 3 reps - 15 sec hold - Supine Sciatic Nerve Glide  - 1 x daily - 7 x weekly - 2 sets - 30-60 reps - Marching Bridge  - 1 x daily - 7 x weekly - 2 sets - 10 reps - Squat with Chair Touch  - 1 x daily - 7 x weekly - 3 sets - 10 reps - Standing Anti-Rotation Press with Anchored Resistance  - 1 x daily - 7 x weekly - 2 sets - 10 reps - Standing Trunk Rotation with Resistance  - 1 x daily - 7 x weekly - 2 sets - 10 reps - Single Arm Row with Trunk Rotation  - 1 x daily - 7 x weekly - 1 sets - 20 reps - Standing March  - 1 x daily - 7 x weekly - 1 sets - 20 reps  ASSESSMENT:  CLINICAL IMPRESSION: Pt has been traveling for work/sitting a lot and has not returned since evaluation (about 4 weeks).  He arrived feeling like maybe he'd gone backwards with symptom severity due to work demands.  We decided to try lumbar traction to address radicular symptoms in addition to LBP.  Pt reported relief end of session.  He feels better with more dynamic therex vs static stabilization  Updated HEP to reflect therex from today.  Good response to hip hinge adductor stretch with cue to open SIT bones into lunge.  EVAL: Patient is a 63 y.o. male who was seen today for physical therapy evaluation and treatment for lumbar radiculopathy.  Pt last lumbar and pelvic imaging done in 2023 showed degenerative changes in lumbar spine and mild degenerative changed in Lt hip.  Pt drives, travels, sits a lot for work and is not tolerating these demands as well as he used to.  Sitting is limited to < 30 min before onset of symptoms.  He will get  stiffness in lumbar spine making sit to stand slow, cautious and painful.  He also gets tingling/numbness into Lt LE with intermittent pain shooting into Lt LE (lateral hip, posterior thigh, plantar foot).  These symptoms have progressed since he was last seen in PT.  Pt has slight end range limitation in Lt hip ROM but near full and symmetrical to Rt hip.  He has signif neural tension in Lt LE with + SLR and slight weakness 4/5 in Lt hip ext, abd.  Trigger points are present in lumbar, pelvic, hip and LE soft tissues. Pt will benefit from skilled PT to address findings, reduce pain, and maximize tolerance of daily demands.  OBJECTIVE IMPAIRMENTS: decreased activity tolerance, decreased mobility, difficulty walking, decreased ROM, decreased strength, hypomobility, increased muscle spasms, impaired flexibility, impaired sensation, impaired tone, improper body mechanics, postural dysfunction, and pain.   ACTIVITY LIMITATIONS: carrying, lifting, bending, sitting, standing, dressing, and locomotion level  PARTICIPATION LIMITATIONS: cleaning, laundry, driving, shopping, community activity, occupation, and yard work  PERSONAL FACTORS: Time since onset of injury/illness/exacerbation are also affecting patient's functional outcome.   REHAB POTENTIAL: Excellent  CLINICAL DECISION MAKING: Evolving/moderate complexity  EVALUATION COMPLEXITY: Moderate   GOALS: Goals reviewed with patient? Yes  SHORT TERM GOALS: Target date: 01/09/24  Pt will be ind with initial HEP Baseline: Goal status: INITIAL  2.  Pt will report reduced pain by at least 25% with work travel. Baseline:  Goal status: INITIAL    LONG TERM GOALS: Target date: 02/06/24  Pt will be ind with advanced HEP Baseline:  Goal status: INITIAL  2.  Pt will be able to drive up to 1.5 hours for work trips with improved pain not to exceed 5/10 Baseline:  Goal status: INITIAL  3.  Pt will improve ODI score by at least 12 points to reduce  disability scale level from severe to moderate Baseline: 46%, goal is 34% Goal status: INITIAL  4.  Pt will demo at least 4+/5 strength in Lt hip abd and ext for improved stability of Lt hip with closed chain activities like squatting, stairs, walking Baseline: 4/5 Goal status: INITIAL  5.  Pt will be able to bend to load and empty dishwasher with proper body mechanics and at least 75% more confidence in back stability with this task Baseline:  Goal status: INITIAL  6.  Pt will be able to step into pants and don shoes and socks with min pain Baseline:  Goal status: INITIAL  PLAN:  PT FREQUENCY: 1-2x/week  PT DURATION: 8 weeks  PLANNED INTERVENTIONS: 97110-Therapeutic exercises, 97530- Therapeutic activity, [id number]- Neuromuscular re-education, 97535- Self Care, [id number]- Manual therapy, 97012- Traction (mechanical), [phone] (1-2 muscles), [id number] (3+ muscles)- Dry Needling, Patient/Family education, Taping, Joint mobilization, Spinal mobilization, Cryotherapy, and Moist heat.  PLAN FOR NEXT SESSION: f/u on mechanical lumbar traction, review and progress HEP.  DN or repeat traction, adductor stretch review, Core and hip strength, lumbar mobs  Madisson Kulaga, PT 01/10/24 11:00 AM

## 2024-01-17 ENCOUNTER — Encounter: Payer: Self-pay | Admitting: Physical Therapy

## 2024-01-17 ENCOUNTER — Ambulatory Visit: Admitting: Physical Therapy

## 2024-01-17 DIAGNOSIS — M6281 Muscle weakness (generalized): Secondary | ICD-10-CM

## 2024-01-17 DIAGNOSIS — R293 Abnormal posture: Secondary | ICD-10-CM

## 2024-01-17 DIAGNOSIS — M25552 Pain in left hip: Secondary | ICD-10-CM

## 2024-01-17 DIAGNOSIS — M5416 Radiculopathy, lumbar region: Secondary | ICD-10-CM | POA: Diagnosis not present

## 2024-01-17 NOTE — Therapy (Signed)
 OUTPATIENT PHYSICAL THERAPY THORACOLUMBAR TREATMENT   Patient Name: Sean Cochran MRN: [id number] DOB:1960/06/13, 63 y.o., male Today's Date: 01/17/2024  END OF SESSION:  PT End of Session - 01/17/24 0929     Visit Number 3    Date for Recertification  02/06/24    Authorization Type U[employer] - medical necessity    PT Start Time 0929    PT Stop Time 1017    PT Time Calculation (min) 48 min    Activity Tolerance Patient tolerated treatment well    Behavior During Therapy [facility] for tasks assessed/performed            Past Medical History:  Diagnosis Date   Abnormal finding on MRI of brain 10/04/2014   Back pain    Colon polyp 02/19/2013   Dr. Kristie - Repeat in 5 years   Constipation    Hip pain    History of pineal cyst    Hyperlipidemia    Memory loss    Past Surgical History:  Procedure Laterality Date   BICEPS TENDON REPAIR Left    COLONOSCOPY     ROTATOR CUFF REPAIR Right    TENNIS ELBOW RELEASE/NIRSCHEL PROCEDURE Left 08/31/2021   Procedure: TENNIS ELBOW RELEASE/NIRSCHEL PROCEDURE;  Surgeon: Josefina Chew, MD;  Location: [facility];  Service: Orthopedics;  Laterality: Left;   TONSILLECTOMY     VASECTOMY     Patient Active Problem List   Diagnosis Date Noted   Family history of colonic polyps 11/20/2023   Need for immunization against influenza 11/20/2023   Encounter for general adult medical examination with abnormal findings 11/20/2023   Immunization due 11/20/2023   Multiple dysplastic nevi 11/20/2023   Primary osteoarthritis involving multiple joints 11/20/2023   Lumbar radiculopathy 07/04/2021   Allergic rhinitis 06/20/2020   Nonalcoholic steatohepatitis (NASH) 06/17/2018   Eczema 12/31/2016   Abnormal finding on MRI of brain 10/04/2014   Hyperlipidemia LDL goal <160 05/08/2013    PCP: Joshua Debby LITTIE, MD  REFERRING PROVIDER: Joshua Debby LITTIE, MD  REFERRING DIAG: M54.16 (ICD-10-CM) - Lumbar radiculopathy M15.0 (ICD-10-CM) - Primary  osteoarthritis involving multiple joints  Rationale for Evaluation and Treatment: Rehabilitation  THERAPY DIAG:  Radiculopathy, lumbar region  Muscle weakness (generalized)  Abnormal posture  Pain in left hip  ONSET DATE: Spring 2025  SUBJECTIVE:                                                                                                                                                                                           SUBJECTIVE STATEMENT: The traction felt good but not long lasting.  I played tennis last night  and my back got so tight I could hardly get into ready position.  EVAL: I have been traveling, driving and sitting more for work so my back and hips are getting stiff, painful, numbness.  My central low back gets really stiff.  My left leg can't sit as well into [place].    PERTINENT HISTORY:  Plays tennis Weight workouts several days a week 3-4x/week various stretches and HEP from PT  PAIN:  PAIN:  Are you having pain? Yes NPRS scale: 7/10 Pain location: central and Rt/Lt lumbar, SITS bones, Lt>Rt gluteals, Lt buttock and hamstring numbness, occassional numbness all the way down Lt leg to plantar foot Pain orientation: Left and Bilateral  PAIN TYPE: aching, dull, tight, and numb Pain description: constant  Aggravating factors: AM stiffness, sitting, sit to stand after prolonged sitting, doing dishes/bending  Relieving factors: heat   PRECAUTIONS: None  RED FLAGS: None   WEIGHT BEARING RESTRICTIONS: No  FALLS:  Has patient fallen in last 6 months? No  LIVING ENVIRONMENT: Lives with: lives with their spouse   OCCUPATION: full time for [employer] - drives a lot/travel - one way trips are about 1.5 hours  PLOF: Independent  PATIENT GOALS: better mobility, less pain  NEXT MD VISIT: as needed  OBJECTIVE:  Note: Objective measures were completed at Evaluation unless otherwise noted.  DIAGNOSTIC FINDINGS:  From  2023: IMPRESSION: LUMBAR MRI 1. Facet arthropathy most advanced at L4-L5, with a small effusion on the left and periarticular edema and enhancement on the right. This may reflect a source of pain. 2. Diffuse disc bulge at L4-L5 resulting in narrowing of the subarticular zones without evidence of frank nerve root impingement, and mild bilateral neural foraminal stenosis with possible irritation of the exiting right L4 nerve root by extraforaminal disc material. 3. Diffuse disc bulge at L5-S1 which contacts the traversing right S1 nerve root. 4. No other significant spinal canal or neural foraminal stenosis, and no other evidence of nerve root impingement.  IMPRESSION: PELVIC MRI Small volume of fluid in the trochanteric bursa bilaterally suggestive of bursitis.   Mild left hip degenerative change.    PATIENT SURVEYS:  Modified Oswestry:  MODIFIED OSWESTRY DISABILITY SCALE  Date: 12/12/23 Score  Pain intensity 4 =  Pain medication provides me with little relief from pain.  2. Personal care (washing, dressing, etc.) 2 =  It is painful to take care of myself, and I am slow and careful.  3. Lifting 3 = Pain prevents me from lifting heavy weights, but I can manage light to medium weights if they are conveniently positioned  4. Walking 0 = Pain does not prevent me from walking any distance  5. Sitting 3 =  Pain prevents me from sitting more than  hour.  6. Standing 3 =  Pain prevents me from standing more than 1/2 hour.  7. Sleeping 0 = Pain does not prevent me from sleeping well.  8. Social Life 1 =  My social life is normal, but it increases my level of pain.  9. Traveling 4 = My pain restricts my travel to short necessary journeys under 1/2 hour.  10. Employment/ Homemaking 2 = I can perform most of my homemaking/job duties, but pain prevents me from performing more physically stressful activities (eg, lifting, vacuuming).  Total 23/50 = 46%   Interpretation of scores: Score  Category Description  0-20% Minimal Disability The patient can cope with most living activities. Usually no treatment is indicated apart from advice  on lifting, sitting and exercise  21-40% Moderate Disability The patient experiences more pain and difficulty with sitting, lifting and standing. Travel and social life are more difficult and they may be disabled from work. Personal care, sexual activity and sleeping are not grossly affected, and the patient can usually be managed by conservative means  41-60% Severe Disability Pain remains the main problem in this group, but activities of daily living are affected. These patients require a detailed investigation  61-80% Crippled Back pain impinges on all aspects of the patient's life. Positive intervention is required  81-100% Bed-bound These patients are either bed-bound or exaggerating their symptoms  Bluford FORBES Zoe DELENA Karon DELENA, et al. Surgery versus conservative management of stable thoracolumbar fracture: the PRESTO feasibility RCT. [place] ([place]): [employer]; 2021 Nov. [facility] Technology Assessment, No. 25.62.) Appendix 3, Oswestry Disability Index category descriptors. Available from: Findjewelers.cz  Minimally Clinically Important Difference (MCID) = 12.8%  COGNITION: Overall cognitive status: Within functional limits for tasks assessed     SENSATION: Onset of tingling/numbness into Lt hip, posterior thigh and plantar foot with prolonged sitting and walking   MUSCLE LENGTH: Hamstrings:  Lt more limited by neural tension with + SLR at 50 deg,  Rt - limited hamstring length 60 deg  Bil adductors tight 25%  POSTURE: sway back posture, reduced lumbar lordosis, pelvic torsion into Lt rot present  PALPATION: Tight lumbar PA throughout lumbar spine, especially L4-S1 Trigger points present bil glut med, bil hamstrings, bil adductor magnus, bil adductors, Lt piriformis Tenderness present along bil  hamstring length, proximal attachments of adductors bil, bil QL, lumbar multifidi bil  LUMBAR ROM:   AROM eval  Flexion Fingers to ankles, tight bil HS and lumbar spine  Extension Full, sore lumbar  Right lateral flexion Full Rt SI pain  Left lateral flexion Full Rt SI pain  Right rotation Full no pain  Left rotation Full no pain   (Blank rows = not tested)  LOWER EXTREMITY ROM:    Hip flexion and ext WNL bil Slight end range limitation without pain Lt hip ER, IR, abd  LOWER EXTREMITY MMT:    MMT Right eval Left eval  Hip flexion    Hip extension 5 4  Hip abduction 5 4  Hip adduction    Hip internal rotation    Hip external rotation    Knee flexion 5 4  Knee extension    Ankle dorsiflexion    Ankle plantarflexion    Ankle inversion    Ankle eversion     (Blank rows = not tested)  LUMBAR SPECIAL TESTS:  Straight leg raise test: Positive on Lt at 50 deg    GAIT: WNL  TREATMENT DATE:  01/17/24 Standing alt 90/90 march x20 Standing on flat side of BOSU bil 4lb raises x10 Cable pulley 10lb row with rotation alt directions x20 Upright row bil 10lb cable pulleys x12 Double green tband handles pallof press out with trunk rotation x 8 each way Squat to chair holding 10lb x10 Trigger Point Dry Needling  Initial Treatment: Pt instructed on Dry Needling rational, procedures, and possible side effects. Pt instructed to expect mild to moderate muscle soreness later in the day and/or into the next day.  Pt instructed in methods to reduce muscle soreness. Pt instructed to continue prescribed HEP. Patient was educated on signs and symptoms of infection and other risk factors and advised to seek medical attention should they occur.  Patient verbalized understanding of these instructions and education.  Patient Verbal Consent Given: Yes Education Handout Provided: Previously Provided Muscles Treated: bil lumbar multifidi, glut med, piriformis, glut max, glut  min Electrical Stimulation Performed: No Treatment Response/Outcome: signif twitch of glut med and piriformis, ache in lumbar     01/10/24 Supine sciatic flossing at ankle bil Standing holding single kbell 10lb overhead with alt LE march each hand Squat to chair holding 10lb kbell with 5 OH press, 5 chest press, 5 hold at chest Leaning dynamic adductor stretch with sitting back into SITS bone Cable pulley 10lb row with rotation alt directions x20 Pallof press double green handles x10, then 5x trunk rotation Mechanical lumbar traction min 75lb max 100lb x10' Updates to HEP during traction  12/12/23 Pt education regarding evaluation findings Pt education about neural tension vs muscle tightness HEP initiated                                                                                                                          PATIENT EDUCATION:  Education details: [email or web address] Person educated: Patient Education method: Programmer, Multimedia, Demonstration, Verbal cues, and Handouts Education comprehension: verbalized understanding and returned demonstration  HOME EXERCISE PROGRAM: Access Code: [email or web address] URL: [email or web address] Date: 01/10/2024 Prepared by: Orvil Auburn Hert  Exercises - Seated Hamstring Stretch  - 1 x daily - 7 x weekly - 1 sets - 2 reps - 20-30 hold - Side Plank on Knees  - 1 x daily - 7 x weekly - 1 sets - 3 reps - 15 hold - Standard Plank  - 1 x daily - 7 x weekly - 1 sets - 3 reps - 15 sec hold - Supine Sciatic Nerve Glide  - 1 x daily - 7 x weekly - 2 sets - 30-60 reps - Marching Bridge  - 1 x daily - 7 x weekly - 2 sets - 10 reps - Squat with Chair Touch  - 1 x daily - 7 x weekly - 3 sets - 10 reps - Standing Anti-Rotation Press with Anchored Resistance  - 1 x daily - 7 x weekly - 2 sets - 10 reps - Standing Trunk Rotation with Resistance  - 1 x daily - 7 x weekly - 2 sets - 10 reps - Single Arm Row with Trunk Rotation  - 1 x daily - 7 x weekly - 1  sets - 20 reps - Standing March  - 1 x daily - 7 x weekly - 1 sets - 20 reps  ASSESSMENT:  CLINICAL IMPRESSION: Pt felt short lived relief with mechanical traction last visit.  Played tennis last night and was very stiff and sore in lumbar spine.  HEP is going well with muscle soreness of target areas for strength.  Added lumbar and bil hip DN to gluteals and piriformis today with most response at bil hips.  Pt felt he could flex lumbar spine deeper end of session.   EVAL: Patient is a 63 y.o. male who was seen today for physical therapy evaluation and treatment  for lumbar radiculopathy.  Pt last lumbar and pelvic imaging done in 2023 showed degenerative changes in lumbar spine and mild degenerative changed in Lt hip.  Pt drives, travels, sits a lot for work and is not tolerating these demands as well as he used to.  Sitting is limited to < 30 min before onset of symptoms.  He will get stiffness in lumbar spine making sit to stand slow, cautious and painful.  He also gets tingling/numbness into Lt LE with intermittent pain shooting into Lt LE (lateral hip, posterior thigh, plantar foot).  These symptoms have progressed since he was last seen in PT.  Pt has slight end range limitation in Lt hip ROM but near full and symmetrical to Rt hip.  He has signif neural tension in Lt LE with + SLR and slight weakness 4/5 in Lt hip ext, abd.  Trigger points are present in lumbar, pelvic, hip and LE soft tissues. Pt will benefit from skilled PT to address findings, reduce pain, and maximize tolerance of daily demands.  OBJECTIVE IMPAIRMENTS: decreased activity tolerance, decreased mobility, difficulty walking, decreased ROM, decreased strength, hypomobility, increased muscle spasms, impaired flexibility, impaired sensation, impaired tone, improper body mechanics, postural dysfunction, and pain.   ACTIVITY LIMITATIONS: carrying, lifting, bending, sitting, standing, dressing, and locomotion level  PARTICIPATION  LIMITATIONS: cleaning, laundry, driving, shopping, community activity, occupation, and yard work  PERSONAL FACTORS: Time since onset of injury/illness/exacerbation are also affecting patient's functional outcome.   REHAB POTENTIAL: Excellent  CLINICAL DECISION MAKING: Evolving/moderate complexity  EVALUATION COMPLEXITY: Moderate   GOALS: Goals reviewed with patient? Yes  SHORT TERM GOALS: Target date: 01/09/24  Pt will be ind with initial HEP Baseline: Goal status: INITIAL  2.  Pt will report reduced pain by at least 25% with work travel. Baseline:  Goal status: INITIAL    LONG TERM GOALS: Target date: 02/06/24  Pt will be ind with advanced HEP Baseline:  Goal status: INITIAL  2.  Pt will be able to drive up to 1.5 hours for work trips with improved pain not to exceed 5/10 Baseline:  Goal status: INITIAL  3.  Pt will improve ODI score by at least 12 points to reduce disability scale level from severe to moderate Baseline: 46%, goal is 34% Goal status: INITIAL  4.  Pt will demo at least 4+/5 strength in Lt hip abd and ext for improved stability of Lt hip with closed chain activities like squatting, stairs, walking Baseline: 4/5 Goal status: INITIAL  5.  Pt will be able to bend to load and empty dishwasher with proper body mechanics and at least 75% more confidence in back stability with this task Baseline:  Goal status: INITIAL  6.  Pt will be able to step into pants and don shoes and socks with min pain Baseline:  Goal status: INITIAL  PLAN:  PT FREQUENCY: 1-2x/week  PT DURATION: 8 weeks  PLANNED INTERVENTIONS: 97110-Therapeutic exercises, 97530- Therapeutic activity, [id number]- Neuromuscular re-education, 97535- Self Care, [id number]- Manual therapy, 97012- Traction (mechanical), [id number] (1-2 muscles), 20561 (3+ muscles)- Dry Needling, Patient/Family education, Taping, Joint mobilization, Spinal mobilization, Cryotherapy, and Moist heat.  PLAN FOR NEXT SESSION: f/u on  DN #1, review and progress HEP.  DN or repeat traction, adductor stretch review, Core and hip strength, lumbar mobs  Heavenleigh Petruzzi, PT 01/17/24 12:21 PM

## 2024-01-24 ENCOUNTER — Encounter: Payer: Self-pay | Admitting: Physical Therapy

## 2024-01-24 ENCOUNTER — Ambulatory Visit: Admitting: Physical Therapy

## 2024-01-24 DIAGNOSIS — M5416 Radiculopathy, lumbar region: Secondary | ICD-10-CM

## 2024-01-24 DIAGNOSIS — M6281 Muscle weakness (generalized): Secondary | ICD-10-CM

## 2024-01-24 DIAGNOSIS — R293 Abnormal posture: Secondary | ICD-10-CM

## 2024-01-24 DIAGNOSIS — M25552 Pain in left hip: Secondary | ICD-10-CM

## 2024-01-24 NOTE — Therapy (Signed)
 OUTPATIENT PHYSICAL THERAPY THORACOLUMBAR TREATMENT   Patient Name: KADRIAN PARTCH MRN: [id number] DOB:1960/03/13, 63 y.o., male Today's Date: 01/24/2024  END OF SESSION:  PT End of Session - 01/24/24 0932     Visit Number 4    Date for Recertification  02/06/24    Authorization Type UHC - medical necessity    PT Start Time 0930    PT Stop Time 1015    PT Time Calculation (min) 45 min    Activity Tolerance Patient tolerated treatment well    Behavior During Therapy [facility] for tasks assessed/performed             Past Medical History:  Diagnosis Date   Abnormal finding on MRI of brain 10/04/2014   Back pain    Colon polyp 02/19/2013   Dr. Kristie - Repeat in 5 years   Constipation    Hip pain    History of pineal cyst    Hyperlipidemia    Memory loss    Past Surgical History:  Procedure Laterality Date   BICEPS TENDON REPAIR Left    COLONOSCOPY     ROTATOR CUFF REPAIR Right    TENNIS ELBOW RELEASE/NIRSCHEL PROCEDURE Left 08/31/2021   Procedure: TENNIS ELBOW RELEASE/NIRSCHEL PROCEDURE;  Surgeon: Josefina Chew, MD;  Location: [facility];  Service: Orthopedics;  Laterality: Left;   TONSILLECTOMY     VASECTOMY     Patient Active Problem List   Diagnosis Date Noted   Family history of colonic polyps 11/20/2023   Need for immunization against influenza 11/20/2023   Encounter for general adult medical examination with abnormal findings 11/20/2023   Immunization due 11/20/2023   Multiple dysplastic nevi 11/20/2023   Primary osteoarthritis involving multiple joints 11/20/2023   Lumbar radiculopathy 07/04/2021   Allergic rhinitis 06/20/2020   Nonalcoholic steatohepatitis (NASH) 06/17/2018   Eczema 12/31/2016   Abnormal finding on MRI of brain 10/04/2014   Hyperlipidemia LDL goal <160 05/08/2013    PCP: Joshua Debby LITTIE, MD  REFERRING PROVIDER: Joshua Debby LITTIE, MD  REFERRING DIAG: M54.16 (ICD-10-CM) - Lumbar radiculopathy M15.0 (ICD-10-CM) - Primary  osteoarthritis involving multiple joints  Rationale for Evaluation and Treatment: Rehabilitation  THERAPY DIAG:  Radiculopathy, lumbar region  Muscle weakness (generalized)  Abnormal posture  Pain in left hip  ONSET DATE: Spring 2025  SUBJECTIVE:                                                                                                                                                                                           SUBJECTIVE STATEMENT: The nerve pain into both legs might be a little better.  Still  very tight in glutes/HS, low back.  Played tennis again (lesson) last night.  EVAL: I have been traveling, driving and sitting more for work so my back and hips are getting stiff, painful, numbness.  My central low back gets really stiff.  My left leg can't sit as well into [place].    PERTINENT HISTORY:  Plays tennis Weight workouts several days a week 3-4x/week various stretches and HEP from PT  PAIN:  PAIN:  Are you having pain? Yes NPRS scale: 6/10 Pain location: central and Rt/Lt lumbar, SITS bones, Lt>Rt gluteals, Lt buttock and hamstring numbness, occassional numbness all the way down Lt leg to plantar foot Pain orientation: Left and Bilateral  PAIN TYPE: aching, dull, tight, and numb Pain description: constant  Aggravating factors: AM stiffness, sitting, sit to stand after prolonged sitting, doing dishes/bending  Relieving factors: heat   PRECAUTIONS: None  RED FLAGS: None   WEIGHT BEARING RESTRICTIONS: No  FALLS:  Has patient fallen in last 6 months? No  LIVING ENVIRONMENT: Lives with: lives with their spouse   OCCUPATION: full time for [employer] - drives a lot/travel - one way trips are about 1.5 hours  PLOF: Independent  PATIENT GOALS: better mobility, less pain  NEXT MD VISIT: as needed  OBJECTIVE:  Note: Objective measures were completed at Evaluation unless otherwise noted.  DIAGNOSTIC FINDINGS:  From  2023: IMPRESSION: LUMBAR MRI 1. Facet arthropathy most advanced at L4-L5, with a small effusion on the left and periarticular edema and enhancement on the right. This may reflect a source of pain. 2. Diffuse disc bulge at L4-L5 resulting in narrowing of the subarticular zones without evidence of frank nerve root impingement, and mild bilateral neural foraminal stenosis with possible irritation of the exiting right L4 nerve root by extraforaminal disc material. 3. Diffuse disc bulge at L5-S1 which contacts the traversing right S1 nerve root. 4. No other significant spinal canal or neural foraminal stenosis, and no other evidence of nerve root impingement.  IMPRESSION: PELVIC MRI Small volume of fluid in the trochanteric bursa bilaterally suggestive of bursitis.   Mild left hip degenerative change.    PATIENT SURVEYS:  Modified Oswestry:  MODIFIED OSWESTRY DISABILITY SCALE  Date: 12/12/23 Score  Pain intensity 4 =  Pain medication provides me with little relief from pain.  2. Personal care (washing, dressing, etc.) 2 =  It is painful to take care of myself, and I am slow and careful.  3. Lifting 3 = Pain prevents me from lifting heavy weights, but I can manage light to medium weights if they are conveniently positioned  4. Walking 0 = Pain does not prevent me from walking any distance  5. Sitting 3 =  Pain prevents me from sitting more than  hour.  6. Standing 3 =  Pain prevents me from standing more than 1/2 hour.  7. Sleeping 0 = Pain does not prevent me from sleeping well.  8. Social Life 1 =  My social life is normal, but it increases my level of pain.  9. Traveling 4 = My pain restricts my travel to short necessary journeys under 1/2 hour.  10. Employment/ Homemaking 2 = I can perform most of my homemaking/job duties, but pain prevents me from performing more physically stressful activities (eg, lifting, vacuuming).  Total 23/50 = 46%   Interpretation of scores: Score  Category Description  0-20% Minimal Disability The patient can cope with most living activities. Usually no treatment is indicated apart from advice  on lifting, sitting and exercise  21-40% Moderate Disability The patient experiences more pain and difficulty with sitting, lifting and standing. Travel and social life are more difficult and they may be disabled from work. Personal care, sexual activity and sleeping are not grossly affected, and the patient can usually be managed by conservative means  41-60% Severe Disability Pain remains the main problem in this group, but activities of daily living are affected. These patients require a detailed investigation  61-80% Crippled Back pain impinges on all aspects of the patient's life. Positive intervention is required  81-100% Bed-bound These patients are either bed-bound or exaggerating their symptoms  Bluford FORBES Zoe DELENA Karon DELENA, et al. Surgery versus conservative management of stable thoracolumbar fracture: the PRESTO feasibility RCT. [place] ([place]): [employer]; 2021 Nov. [facility] Technology Assessment, No. 25.62.) Appendix 3, Oswestry Disability Index category descriptors. Available from: Findjewelers.cz  Minimally Clinically Important Difference (MCID) = 12.8%  COGNITION: Overall cognitive status: Within functional limits for tasks assessed     SENSATION: Onset of tingling/numbness into Lt hip, posterior thigh and plantar foot with prolonged sitting and walking   MUSCLE LENGTH: Hamstrings:  Lt more limited by neural tension with + SLR at 50 deg,  Rt - limited hamstring length 60 deg  Bil adductors tight 25%  POSTURE: sway back posture, reduced lumbar lordosis, pelvic torsion into Lt rot present  PALPATION: Tight lumbar PA throughout lumbar spine, especially L4-S1 Trigger points present bil glut med, bil hamstrings, bil adductor magnus, bil adductors, Lt piriformis Tenderness present along bil  hamstring length, proximal attachments of adductors bil, bil QL, lumbar multifidi bil  LUMBAR ROM:   AROM eval  Flexion Fingers to ankles, tight bil HS and lumbar spine  Extension Full, sore lumbar  Right lateral flexion Full Rt SI pain  Left lateral flexion Full Rt SI pain  Right rotation Full no pain  Left rotation Full no pain   (Blank rows = not tested)  LOWER EXTREMITY ROM:    Hip flexion and ext WNL bil Slight end range limitation without pain Lt hip ER, IR, abd  LOWER EXTREMITY MMT:    MMT Right eval Left eval  Hip flexion    Hip extension 5 4  Hip abduction 5 4  Hip adduction    Hip internal rotation    Hip external rotation    Knee flexion 5 4  Knee extension    Ankle dorsiflexion    Ankle plantarflexion    Ankle inversion    Ankle eversion     (Blank rows = not tested)  LUMBAR SPECIAL TESTS:  Straight leg raise test: Positive on Lt at 50 deg    GAIT: WNL  TREATMENT DATE:  01/24/24 HS stretch foot on chair, then turn for adductor stretch - hold wall for balance, x30 Trigger Point Dry Needling  Subsequent Treatment: Instructions provided previously at initial dry needling treatment.   Patient Verbal Consent Given: Yes Education Handout Provided: Previously Provided Muscles Treated: bil - adductor magnus, medial HS, lateral HS Electrical Stimulation Performed: No Treatment Response/Outcome: signif twitches in adductor magnus and medial HS bil Seated SI joint A/ROM x20  01/17/24 Standing alt 90/90 march x20 Standing on flat side of BOSU bil 4lb raises x10 Cable pulley 10lb row with rotation alt directions x20 Upright row bil 10lb cable pulleys x12 Double green tband handles pallof press out with trunk rotation x 8 each way Squat to chair holding 10lb x10 Trigger Point Dry Needling  Initial Treatment:  Pt instructed on Dry Needling rational, procedures, and possible side effects. Pt instructed to expect mild to moderate muscle soreness later in  the day and/or into the next day.  Pt instructed in methods to reduce muscle soreness. Pt instructed to continue prescribed HEP. Patient was educated on signs and symptoms of infection and other risk factors and advised to seek medical attention should they occur.  Patient verbalized understanding of these instructions and education.   Patient Verbal Consent Given: Yes Education Handout Provided: Previously Provided Muscles Treated: bil lumbar multifidi, glut med, piriformis, glut max, glut min Electrical Stimulation Performed: No Treatment Response/Outcome: signif twitch of glut med and piriformis, ache in lumbar    01/10/24 Supine sciatic flossing at ankle bil Standing holding single kbell 10lb overhead with alt LE march each hand Squat to chair holding 10lb kbell with 5 OH press, 5 chest press, 5 hold at chest Leaning dynamic adductor stretch with sitting back into SITS bone Cable pulley 10lb row with rotation alt directions x20 Pallof press double green handles x10, then 5x trunk rotation Mechanical lumbar traction min 75lb max 100lb x10' Updates to HEP during traction   PATIENT EDUCATION:  Education details: [email or web address] Person educated: Patient Education method: Explanation, Demonstration, Verbal cues, and Handouts Education comprehension: verbalized understanding and returned demonstration  HOME EXERCISE PROGRAM: Access Code: [email or web address] URL: [email or web address] Date: 01/10/2024 Prepared by: Orvil Haden Suder  Exercises - Seated Hamstring Stretch  - 1 x daily - 7 x weekly - 1 sets - 2 reps - 20-30 hold - Side Plank on Knees  - 1 x daily - 7 x weekly - 1 sets - 3 reps - 15 hold - Standard Plank  - 1 x daily - 7 x weekly - 1 sets - 3 reps - 15 sec hold - Supine Sciatic Nerve Glide  - 1 x daily - 7 x weekly - 2 sets - 30-60 reps - Marching Bridge  - 1 x daily - 7 x weekly - 2 sets - 10 reps - Squat with Chair Touch  - 1 x daily - 7 x weekly - 3 sets - 10 reps -  Standing Anti-Rotation Press with Anchored Resistance  - 1 x daily - 7 x weekly - 2 sets - 10 reps - Standing Trunk Rotation with Resistance  - 1 x daily - 7 x weekly - 2 sets - 10 reps - Single Arm Row with Trunk Rotation  - 1 x daily - 7 x weekly - 1 sets - 20 reps - Standing March  - 1 x daily - 7 x weekly - 1 sets - 20 reps  ASSESSMENT:  CLINICAL IMPRESSION: Pt had signif twitch and release of bil adductor magnus and medial hamstrings with DN today.  He reports improved radicular symptom severity since starting PT. We adjusted position for HS and adductor stretch today to avoid doing any HEP seated.  He felt a good stretch in bil SI joints with seated SI A/ROM.  EVAL: Patient is a 63 y.o. male who was seen today for physical therapy evaluation and treatment for lumbar radiculopathy.  Pt last lumbar and pelvic imaging done in 2023 showed degenerative changes in lumbar spine and mild degenerative changed in Lt hip.  Pt drives, travels, sits a lot for work and is not tolerating these demands as well as he used to.  Sitting is limited to < 30 min before onset of symptoms.  He will get stiffness in lumbar spine making sit to stand slow, cautious  and painful.  He also gets tingling/numbness into Lt LE with intermittent pain shooting into Lt LE (lateral hip, posterior thigh, plantar foot).  These symptoms have progressed since he was last seen in PT.  Pt has slight end range limitation in Lt hip ROM but near full and symmetrical to Rt hip.  He has signif neural tension in Lt LE with + SLR and slight weakness 4/5 in Lt hip ext, abd.  Trigger points are present in lumbar, pelvic, hip and LE soft tissues. Pt will benefit from skilled PT to address findings, reduce pain, and maximize tolerance of daily demands.  OBJECTIVE IMPAIRMENTS: decreased activity tolerance, decreased mobility, difficulty walking, decreased ROM, decreased strength, hypomobility, increased muscle spasms, impaired flexibility, impaired  sensation, impaired tone, improper body mechanics, postural dysfunction, and pain.   ACTIVITY LIMITATIONS: carrying, lifting, bending, sitting, standing, dressing, and locomotion level  PARTICIPATION LIMITATIONS: cleaning, laundry, driving, shopping, community activity, occupation, and yard work  PERSONAL FACTORS: Time since onset of injury/illness/exacerbation are also affecting patient's functional outcome.   REHAB POTENTIAL: Excellent  CLINICAL DECISION MAKING: Evolving/moderate complexity  EVALUATION COMPLEXITY: Moderate   GOALS: Goals reviewed with patient? Yes  SHORT TERM GOALS: Target date: 01/09/24  Pt will be ind with initial HEP Baseline: Goal status: MET 01/24/24  2.  Pt will report reduced pain by at least 25% with work travel. Baseline:  Goal status: ONGOING 11/21    LONG TERM GOALS: Target date: 02/06/24  Pt will be ind with advanced HEP Baseline:  Goal status: INITIAL  2.  Pt will be able to drive up to 1.5 hours for work trips with improved pain not to exceed 5/10 Baseline:  Goal status: INITIAL  3.  Pt will improve ODI score by at least 12 points to reduce disability scale level from severe to moderate Baseline: 46%, goal is 34% Goal status: INITIAL  4.  Pt will demo at least 4+/5 strength in Lt hip abd and ext for improved stability of Lt hip with closed chain activities like squatting, stairs, walking Baseline: 4/5 Goal status: INITIAL  5.  Pt will be able to bend to load and empty dishwasher with proper body mechanics and at least 75% more confidence in back stability with this task Baseline:  Goal status: INITIAL  6.  Pt will be able to step into pants and don shoes and socks with min pain Baseline:  Goal status: INITIAL  PLAN:  PT FREQUENCY: 1-2x/week  PT DURATION: 8 weeks  PLANNED INTERVENTIONS: 97110-Therapeutic exercises, 97530- Therapeutic activity, [id number]- Neuromuscular re-education, 97535- Self Care, [id number]- Manual therapy, 97012-  Traction (mechanical), [phone] (1-2 muscles), 20561 (3+ muscles)- Dry Needling, Patient/Family education, Taping, Joint mobilization, Spinal mobilization, Cryotherapy, and Moist heat.  PLAN FOR NEXT SESSION: ERO next time,f/u on DN #2, review and progress HEP.  DN or repeat traction, adductor stretch review, Core and hip strength, lumbar mobs  Appollonia Klee, PT 01/24/24 12:26 PM

## 2024-02-06 ENCOUNTER — Ambulatory Visit: Admitting: Physical Therapy

## 2024-02-07 ENCOUNTER — Encounter: Admitting: Physical Therapy

## 2024-02-10 ENCOUNTER — Encounter: Payer: Self-pay | Admitting: Sports Medicine

## 2024-02-10 ENCOUNTER — Other Ambulatory Visit: Payer: Self-pay

## 2024-02-10 ENCOUNTER — Ambulatory Visit: Admitting: Sports Medicine

## 2024-02-10 DIAGNOSIS — G8929 Other chronic pain: Secondary | ICD-10-CM

## 2024-02-10 DIAGNOSIS — M720 Palmar fascial fibromatosis [Dupuytren]: Secondary | ICD-10-CM

## 2024-02-10 DIAGNOSIS — M25531 Pain in right wrist: Secondary | ICD-10-CM

## 2024-02-10 NOTE — Progress Notes (Signed)
 Sean Cochran - 63 y.o. male MRN [id number]  Date of birth: 1960-10-14  Office Visit Note: Visit Date: 02/10/2024 PCP: Joshua Debby CROME, MD Referred by: Joshua Debby CROME, MD  Subjective: Chief Complaint  Patient presents with   Right Hand - Pain   HPI: Sean Cochran is a pleasant 63 y.o. male who presents today for evaluation of right hand/palmar thumb pain x 6 months, worsened x 1 month.  [name] states that he began having pain and locking/spasming episodes in the right hand about 6 months ago. One month ago he was tearing mail up and felt a sharp pain and a pop/pull in his hand over the thenar eminence. He points over the thenar space when describing this pain, but not one specific spot. He does not have pain with opposition, but says it is tender to touch. He denies any numbness, tingling, or swelling in the hand. He is not taking any medicine or doing anything at home to treat his pain. He is left-hand dominant but uses his computer mouse with his right hand.   In terms of the right hand/wrist when he was 17 he did have lacerations cutting his hand on glass, which required stitches.  He also has thickening/nodularity of the contralateral hand but this does not give him any discomfort  Pertinent ROS were reviewed with the patient and found to be negative unless otherwise specified above in HPI.   Assessment & Plan: Visit Diagnoses:  1. Chronic hand pain, right   2. Pain in right wrist   3. Dupuytren contracture of left hand    Plan: Impression is chronic right hand and wrist pain on the palmar aspect from the region of the wrist joint to the thenar eminence.  This has been bothering him for 6 months and has been resistant to conservative treatment, HEP, and then 1 month ago had a specific injury with a pop/pull that worsened his pain.  He now gets these episodes multiple times daily with associated cramping.  X-rays show no acute bony abnormality over the region of his pain but  does have some calcification within the lateral wrist joint.  Given this, would like to obtain an MRI to further evaluate this as well as the soft tissue of the palmar hand and wrist near the region of the APL/EPB, flexor retinaculum, and associated structures.  I have no hard restrictions for him at this time, activity modification discussed.  Okay for over-the-counter anti-inflammatories only as needed.  Follow-up: Return for I will MyChart message/call once MRI returns to discuss next steps.   Meds & Orders: No orders of the defined types were placed in this encounter.   Orders Placed This Encounter  Procedures   XR Hand Complete Right   MR Wrist Right w/ contrast     Procedures: No procedures performed      Clinical History: No specialty comments available.  He reports that he has never smoked. He has never used smokeless tobacco. No results for input(s): HGBA1C, LABURIC in the last 8760 hours.  Objective:    Physical Exam  Gen: Well-appearing, in no acute distress; non-toxic CV: Well-perfused. Warm.  Resp: Breathing unlabored on room air; no wheezing. Psych: Fluid speech in conversation; appropriate affect; normal thought process  Ortho Exam - Right hand/wrist: Patient points to region of his pain near the thenar eminence and the palmar crease although no specific reproduction of pain with tenderness.  No palpable Dupuytren's or nodularity here.  Negative CMC grind  test.  Okay sign intact.  Finger opposition full and intact.  Full range of motion at the wrist with flexion and extension.  Imaging: XR Hand Complete Right Result Date: 02/10/2024 Complete right hand x-ray including AP, lateral, oblique as well as Henry view was ordered and reviewed by myself today.  X-rays demonstrate no acute fracture noted.  There is mild negative ulnar variance.  No significant degenerative change at the [facility] joint, mild at the MCP joint.  There is a calcification over the ulnar aspect of  the wrist between the lunate and triquetrum, question soft tissue lesion versus prior injury.  Tiny calcification of the ulnar styloid, would be better evaluated with advanced imaging such as MRI.  No bony or soft tissue abnormality near the region of his pain over the thenar eminence.  Past Medical/Family/Surgical/Social History: Medications & Allergies reviewed per EMR, new medications updated. Patient Active Problem List   Diagnosis Date Noted   Family history of colonic polyps 11/20/2023   Need for immunization against influenza 11/20/2023   Encounter for general adult medical examination with abnormal findings 11/20/2023   Immunization due 11/20/2023   Multiple dysplastic nevi 11/20/2023   Primary osteoarthritis involving multiple joints 11/20/2023   Lumbar radiculopathy 07/04/2021   Allergic rhinitis 06/20/2020   Nonalcoholic steatohepatitis (NASH) 06/17/2018   Eczema 12/31/2016   Abnormal finding on MRI of brain 10/04/2014   Hyperlipidemia LDL goal <160 05/08/2013   Past Medical History:  Diagnosis Date   Abnormal finding on MRI of brain 10/04/2014   Back pain    Colon polyp 02/19/2013   Dr. Kristie - Repeat in 5 years   Constipation    Hip pain    History of pineal cyst    Hyperlipidemia    Memory loss    Family History  Problem Relation Age of Onset   Cancer Father        Lymphoma   Depression Sister    Heart attack Brother    Healthy Brother    Healthy Brother    Cancer Maternal Grandfather        Paternal Grandfather   Prostate cancer Maternal Grandfather    Prostate cancer Paternal Grandfather    Colon cancer Neg Hx    Colon polyps Neg Hx    Esophageal cancer Neg Hx    Rectal cancer Neg Hx    Stomach cancer Neg Hx    Past Surgical History:  Procedure Laterality Date   BICEPS TENDON REPAIR Left    COLONOSCOPY     ROTATOR CUFF REPAIR Right    TENNIS ELBOW RELEASE/NIRSCHEL PROCEDURE Left 08/31/2021   Procedure: TENNIS ELBOW RELEASE/NIRSCHEL PROCEDURE;   Surgeon: Josefina Chew, MD;  Location: [facility];  Service: Orthopedics;  Laterality: Left;   TONSILLECTOMY     VASECTOMY     Social History   Occupational History   Occupation: agricultural consultant   Tobacco Use   Smoking status: Never   Smokeless tobacco: Never  Vaping Use   Vaping status: Never Used  Substance and Sexual Activity   Alcohol use: Yes    Alcohol/week: 3.0 standard drinks of alcohol    Types: 3 Glasses of wine per week   Drug use: No   Sexual activity: Yes    Partners: Female

## 2024-02-10 NOTE — Progress Notes (Signed)
 Patient says that he began having pain and locking in the right hand about 6 months ago. One month ago he was tearing mail and felt a sharp pain and a pop/pull in his hand. He points over the thenar space when describing this pain. He does not have pain with opposition, but says it is tender to touch. He denies any numbness, tingling, or swelling in the hand. He is not taking any medicine or doing anything at home to treat his pain. He is left-hand dominant but uses his computer mouse with his right hand.

## 2024-02-14 ENCOUNTER — Ambulatory Visit: Admitting: Physical Therapy

## 2024-02-18 ENCOUNTER — Encounter: Payer: Self-pay | Admitting: Sports Medicine

## 2024-02-21 ENCOUNTER — Ambulatory Visit: Admitting: Physical Therapy

## 2024-02-25 ENCOUNTER — Ambulatory Visit
Admission: RE | Admit: 2024-02-25 | Discharge: 2024-02-25 | Disposition: A | Discharge status: Home / Self Care | Source: Ambulatory Visit | Attending: Sports Medicine | Admitting: Sports Medicine

## 2024-02-25 DIAGNOSIS — G8929 Other chronic pain: Secondary | ICD-10-CM

## 2024-02-25 DIAGNOSIS — M25531 Pain in right wrist: Secondary | ICD-10-CM

## 2024-03-09 ENCOUNTER — Other Ambulatory Visit (HOSPITAL_COMMUNITY): Payer: Self-pay

## 2024-03-09 ENCOUNTER — Other Ambulatory Visit (HOSPITAL_BASED_OUTPATIENT_CLINIC_OR_DEPARTMENT_OTHER): Payer: Self-pay

## 2024-03-09 MED ORDER — FINASTERIDE 1 MG PO TABS
1.0000 mg | ORAL_TABLET | Freq: Every day | ORAL | 0 refills | Status: AC
  Filled 2024-03-09: qty 90, 90d supply, fill #0

## 2024-03-10 ENCOUNTER — Encounter: Payer: Self-pay | Admitting: Sports Medicine

## 2024-03-10 ENCOUNTER — Ambulatory Visit (INDEPENDENT_AMBULATORY_CARE_PROVIDER_SITE_OTHER): Admitting: Sports Medicine

## 2024-03-10 DIAGNOSIS — G8929 Other chronic pain: Secondary | ICD-10-CM

## 2024-03-10 DIAGNOSIS — M79641 Pain in right hand: Secondary | ICD-10-CM | POA: Diagnosis not present

## 2024-03-10 DIAGNOSIS — S63591A Other specified sprain of right wrist, initial encounter: Secondary | ICD-10-CM

## 2024-03-10 DIAGNOSIS — M1811 Unilateral primary osteoarthritis of first carpometacarpal joint, right hand: Secondary | ICD-10-CM

## 2024-03-10 NOTE — Progress Notes (Signed)
 "  Sean Cochran - 64 y.o. male MRN [id number]  Date of birth: Apr 18, 1960  Office Visit Note: Visit Date: 03/10/2024 PCP: Joshua Debby CROME, MD Referred by: Joshua Debby CROME, MD  Subjective: Chief Complaint  Patient presents with   Right Hand - Follow-up   HPI: Sean Cochran is a pleasant 64 y.o. male who presents today for follow-up of right hand/wrist pain, MRI review.  Discussed the use of AI scribe software for clinical note transcription with the patient, who gave verbal consent to proceed.  History of Present Illness Sean Cochran is a 64 year old male with osteoarthritis of the right first carpometacarpal joint and TFCC tear who presents with persistent right thumb and wrist pain.  He has had chronic right thumb pain for several months with gradual onset and intermittent flares. The pain is mainly at the base of the right thumb with occasional radiation up the thumb and sometimes feels like a pulling sensation. It is aggravated by lateral pressure and functional use such as opening jars, gripping, twisting, and repetitive hand tasks. He has mild discomfort with provocative maneuvers but no significant pain with direct pressure at the base of the thumb.  He notes occasional snapping, clicking, and catching in the wrist without significant pain or functional limitation. He recalls one aggravating incident months ago with increased thumb pain that has since gradually improved, and he remains able to perform daily activities without major limitation.  Recent imaging showed a large TFCC defect on the lateral wrist, fluid and inflammation in a tendon crossing the thumb, and degenerative changes at the first carpometacarpal joint with subchondral cysts.  He uses oral ibuprofen intermittently, mainly for back pain, and is not using topical medications, a brace, or other supportive devices for the thumb or wrist.   Pertinent ROS were reviewed with the patient and found to be  negative unless otherwise specified above in HPI.   Assessment & Plan: Visit Diagnoses:  1. Primary osteoarthritis of first carpometacarpal joint of right hand   2. Complex tear of triangular fibrocartilage of right wrist, initial encounter   3. Chronic hand pain, right     Assessment and Plan Assessment & Plan Primary osteoarthritis of first carpometacarpal joint of right hand Chronic moderate osteoarthritis with MRI showing cartilage loss and subchondral cysts. Symptoms align with mechanical stress and acute exacerbation. Family history suggests genetic predisposition. Not severe enough for surgery. Gradual improvement expected, but symptoms may persist or worsen with use. - Fitted cool comfort thumb spica brace for support and symptom relief. - Discussed use of oral NSAIDs (ibuprofen) as needed for analgesia. - Discussed topical diclofenac (Voltaren) gel up to three times daily for symptomatic relief. - Offered corticosteroid injection at the base of the thumb as a future option if symptoms persist or worsen. - Provided education on the natural history of osteoarthritis and the role of mechanical stress in symptom exacerbation.  TFCC tear MRI showed large TFCC defect in right wrist, but pain does not correlate with typical TFCC symptoms. Tear is not symptomatic or contributing to pain. - Provided education regarding TFCC tear, anatomical location, and typical symptoms. - Recommended observation only, as the TFCC tear is not currently symptomatic.  Right hand pain Chronic pain localized to base of thumb, consistent with osteoarthritis exacerbation rather than acute injury or TFCC pathology. - Reinforced that pain is most likely due to osteoarthritis at the first carpometacarpal joint. - Advised use of brace, NSAIDs, and topical therapy as  above for symptomatic management. - Offered corticosteroid injection as a future option if pain persists or worsens.   *Additional considerations:  Referral to Dr. Erwin  Follow-up: Return if symptoms worsen or fail to improve.   Meds & Orders: No orders of the defined types were placed in this encounter.  No orders of the defined types were placed in this encounter.    Procedures: No procedures performed      Clinical History: No specialty comments available.  He reports that he has never smoked. He has never used smokeless tobacco. No results for input(s): HGBA1C, LABURIC in the last 8760 hours.  Objective:    Physical Exam  Gen: Well-appearing, in no acute distress; non-toxic CV: Well-perfused. Warm.  Resp: Breathing unlabored on room air; no wheezing. Psych: Fluid speech in conversation; appropriate affect; normal thought process  *MSK/Ortho Exam: Physical Exam MUSCULOSKELETAL: Hand strength 5/5. Thumb range of motion normal. Pain on thumb carpometacarpal (CMC) grind test. Tenderness at base of thumb. + CMC grind test, Negative finklestein's test. No pain with fovea sign.  Imaging:  MR WRIST RIGHT WO CONTRAST EXAM: MRI OF THE RIGHT WRIST WITHOUT INTRAVENOUS CONTRAST 02/25/2024 04:35:24 PM  TECHNIQUE: Multiplanar multisequence MRI of the right wrist was performed without the administration of intravenous contrast.  COMPARISON: None available.  CLINICAL HISTORY: Chronic right wrist pain.  FINDINGS:  LIMITATIONS/ARTIFACTS: Motion artifact is present, reducing diagnostic sensitivity and specificity.  INTRINSIC LIGAMENTS: Intact scapholunate and lunotriquetral ligaments without interval widening. No significant periligamentous edema.  EXTRINSIC LIGAMENTS: The volar extrinsic ligaments including the radio-scapho-capitate and radio-luno-triquetral ligaments are grossly intact.  TFCC: Very large defect in the TFCC disc measuring about 8 mm in diameter, possible distal extension of a small fragment of the TFCC disc on image 9 series 6. The meniscal homologue, the extensor carpi ulnaris tendon, the volar  and dorsal distal radioulnar ligaments, and the ulnolunate and ulnotriquetral ligaments are preserved.  EXTENSOR TENDONS: Mild tenosynovitis in the second extensor compartment. Other visualized extensor tendons are intact without tenosynovitis or tear.  CARPAL TUNNEL AND FLEXOR TENDONS: Intact flexor tendons and flexor retinaculum. Normal MRI appearance of the median nerve. Intact ulnar nerve in Guyon's canal.  JOINT SPACES: No significant joint effusion. Mild spurring of the distal radius and of the ulnar styloid. Subcortical cyst formation at the 1st metacarpophalangeal (MCP) joint. Moderate degenerative arthropathy at the 1st carpometacarpal articulation. Unremarkable alignment.  BONE MARROW: Small degenerative subcortical cysts or geodes distally and in the mid body of the scaphoid. No acute fracture or aggressive marrow replacing lesion.  SOFT TISSUE: No significant soft tissue edema or fluid collections. No significant muscle edema or atrophy.  IMPRESSION: 1. Very large triangular fibrocartilage complex disc defect measuring approximately 8 mm, with possible distal extension of a small displaced fragment. 2. Mild tenosynovitis in the second extensor compartment. 3. Degenerative changes including moderate arthropathy at the first carpometacarpal joint, subchondral cyst formation at the first metacarpophalangeal joint and within the scaphoid, and mild spurring of the distal radius and ulnar styloid. 4. Motion artifact reduces diagnostic sensitivity and specificity.  Electronically signed by: Ryan Salvage MD 03/09/2024 05:11 PM EST RP Workstation: [id number]   Past Medical/Family/Surgical/Social History: Medications & Allergies reviewed per EMR, new medications updated. Patient Active Problem List   Diagnosis Date Noted   Family history of colonic polyps 11/20/2023   Need for immunization against influenza 11/20/2023   Encounter for general adult medical  examination with abnormal findings 11/20/2023   Immunization due 11/20/2023   Multiple  dysplastic nevi 11/20/2023   Primary osteoarthritis involving multiple joints 11/20/2023   Lumbar radiculopathy 07/04/2021   Allergic rhinitis 06/20/2020   Nonalcoholic steatohepatitis (NASH) 06/17/2018   Eczema 12/31/2016   Abnormal finding on MRI of brain 10/04/2014   Hyperlipidemia LDL goal <160 05/08/2013   Past Medical History:  Diagnosis Date   Abnormal finding on MRI of brain 10/04/2014   Back pain    Colon polyp 02/19/2013   Dr. Kristie - Repeat in 5 years   Constipation    Hip pain    History of pineal cyst    Hyperlipidemia    Memory loss    Family History  Problem Relation Age of Onset   Cancer Father        Lymphoma   Depression Sister    Heart attack Brother    Healthy Brother    Healthy Brother    Cancer Maternal Grandfather        Paternal Grandfather   Prostate cancer Maternal Grandfather    Prostate cancer Paternal Grandfather    Colon cancer Neg Hx    Colon polyps Neg Hx    Esophageal cancer Neg Hx    Rectal cancer Neg Hx    Stomach cancer Neg Hx    Past Surgical History:  Procedure Laterality Date   BICEPS TENDON REPAIR Left    COLONOSCOPY     ROTATOR CUFF REPAIR Right    TENNIS ELBOW RELEASE/NIRSCHEL PROCEDURE Left 08/31/2021   Procedure: TENNIS ELBOW RELEASE/NIRSCHEL PROCEDURE;  Surgeon: Josefina Chew, MD;  [facility]:  [facility];  Service: Orthopedics;  Laterality: Left;   TONSILLECTOMY     VASECTOMY     Social History   Occupational History   Occupation: agricultural consultant   Tobacco Use   Smoking status: Never   Smokeless tobacco: Never  Vaping Use   Vaping status: Never Used  Substance and Sexual Activity   Alcohol use: Yes    Alcohol/week: 3.0 standard drinks of alcohol    Types: 3 Glasses of wine per week   Drug use: No   Sexual activity: Yes    Partners: Female   I spent 33 minutes in the care of the patient today including  face-to-face time, preparation to see the patient, as well as independent review of hand/wrist MRI, interpretation and demonstration with the patient in the room today, discussion on symptomatic and nonsymptomatic findings, activity modification, bracing, discussion on over-the-counter medication as well as role for injection therapy for the above diagnoses.   Lonell Sprang, DO Primary Care Sports Medicine Physician  [facility] - Orthopedics  This note was dictated using Dragon naturally speaking software and may contain errors in syntax, spelling, or content which have not been identified prior to signing this note.   "

## 2024-03-10 NOTE — Progress Notes (Signed)
 Patient denies any change in his symptoms since his last visit. He is here for MRI review today.

## 2024-05-12 ENCOUNTER — Ambulatory Visit: Admitting: Dermatology
